# Patient Record
Sex: Female | Born: 1985 | Race: Black or African American | Hispanic: No | Marital: Single | Smoking: Current every day smoker
Health system: Southern US, Community
[De-identification: ages and names within clinical notes are randomized; demographics above are authoritative.]

## PROBLEM LIST (undated history)

## (undated) ENCOUNTER — Inpatient Hospital Stay (HOSPITAL_COMMUNITY): Payer: Self-pay

## (undated) DIAGNOSIS — F17200 Nicotine dependence, unspecified, uncomplicated: Secondary | ICD-10-CM

## (undated) DIAGNOSIS — R51 Headache: Secondary | ICD-10-CM

## (undated) HISTORY — PX: NO PAST SURGERIES: SHX2092

---
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
## 2007-03-07 ENCOUNTER — Emergency Department (HOSPITAL_COMMUNITY): Admission: EM | Admit: 2007-03-07 | Discharge: 2007-03-07 | Payer: Self-pay | Admitting: Family Medicine

## 2007-03-19 ENCOUNTER — Inpatient Hospital Stay (HOSPITAL_COMMUNITY): Admission: RE | Admit: 2007-03-19 | Discharge: 2007-03-19 | Payer: Self-pay | Admitting: Obstetrics & Gynecology

## 2007-03-20 ENCOUNTER — Inpatient Hospital Stay (HOSPITAL_COMMUNITY): Admission: AD | Admit: 2007-03-20 | Discharge: 2007-03-20 | Payer: Self-pay | Admitting: Obstetrics & Gynecology

## 2007-03-20 ENCOUNTER — Encounter: Payer: Self-pay | Admitting: Obstetrics & Gynecology

## 2007-05-03 ENCOUNTER — Emergency Department (HOSPITAL_COMMUNITY): Admission: EM | Admit: 2007-05-03 | Discharge: 2007-05-03 | Payer: Self-pay | Admitting: Family Medicine

## 2008-04-21 ENCOUNTER — Emergency Department (HOSPITAL_COMMUNITY): Admission: EM | Admit: 2008-04-21 | Discharge: 2008-04-21 | Payer: Self-pay | Admitting: Emergency Medicine

## 2008-08-26 ENCOUNTER — Emergency Department (HOSPITAL_COMMUNITY): Admission: EM | Admit: 2008-08-26 | Discharge: 2008-08-26 | Payer: Self-pay | Admitting: Emergency Medicine

## 2009-02-06 ENCOUNTER — Emergency Department (HOSPITAL_COMMUNITY): Admission: EM | Admit: 2009-02-06 | Discharge: 2009-02-06 | Payer: Self-pay | Admitting: Family Medicine

## 2009-06-15 ENCOUNTER — Inpatient Hospital Stay (HOSPITAL_COMMUNITY): Admission: AD | Admit: 2009-06-15 | Discharge: 2009-06-15 | Payer: Self-pay | Admitting: Obstetrics & Gynecology

## 2009-08-17 ENCOUNTER — Ambulatory Visit: Payer: Self-pay | Admitting: Family Medicine

## 2009-08-17 ENCOUNTER — Inpatient Hospital Stay (HOSPITAL_COMMUNITY): Admission: AD | Admit: 2009-08-17 | Discharge: 2009-08-19 | Payer: Self-pay | Admitting: Obstetrics and Gynecology

## 2009-09-23 ENCOUNTER — Emergency Department (HOSPITAL_COMMUNITY): Admission: EM | Admit: 2009-09-23 | Discharge: 2009-09-23 | Payer: Self-pay | Admitting: Emergency Medicine

## 2009-11-24 ENCOUNTER — Emergency Department (HOSPITAL_COMMUNITY): Admission: EM | Admit: 2009-11-24 | Discharge: 2009-11-24 | Payer: Self-pay | Admitting: Emergency Medicine

## 2010-01-12 ENCOUNTER — Emergency Department (HOSPITAL_COMMUNITY): Admission: EM | Admit: 2010-01-12 | Discharge: 2010-01-12 | Payer: Self-pay | Admitting: Family Medicine

## 2010-06-21 ENCOUNTER — Inpatient Hospital Stay (HOSPITAL_COMMUNITY)
Admission: AD | Admit: 2010-06-21 | Discharge: 2010-06-21 | Payer: Self-pay | Source: Home / Self Care | Admitting: Obstetrics & Gynecology

## 2010-06-21 ENCOUNTER — Ambulatory Visit: Payer: Self-pay | Admitting: Family Medicine

## 2010-08-30 ENCOUNTER — Inpatient Hospital Stay (HOSPITAL_COMMUNITY): Admission: AD | Admit: 2010-08-30 | Discharge: 2010-08-30 | Payer: Self-pay | Admitting: Obstetrics & Gynecology

## 2010-09-14 ENCOUNTER — Inpatient Hospital Stay (HOSPITAL_COMMUNITY)
Admission: AD | Admit: 2010-09-14 | Discharge: 2010-09-14 | Payer: Self-pay | Source: Home / Self Care | Admitting: Family Medicine

## 2010-09-19 ENCOUNTER — Inpatient Hospital Stay (HOSPITAL_COMMUNITY)
Admission: AD | Admit: 2010-09-19 | Discharge: 2010-09-19 | Payer: Self-pay | Source: Home / Self Care | Admitting: Obstetrics and Gynecology

## 2010-09-20 ENCOUNTER — Inpatient Hospital Stay (HOSPITAL_COMMUNITY)
Admission: AD | Admit: 2010-09-20 | Discharge: 2010-09-22 | Payer: Self-pay | Source: Home / Self Care | Attending: Obstetrics and Gynecology | Admitting: Obstetrics and Gynecology

## 2010-09-20 ENCOUNTER — Inpatient Hospital Stay (HOSPITAL_COMMUNITY)
Admission: AD | Admit: 2010-09-20 | Discharge: 2010-09-20 | Payer: Self-pay | Source: Home / Self Care | Admitting: Family Medicine

## 2010-11-02 ENCOUNTER — Ambulatory Visit
Admission: RE | Admit: 2010-11-02 | Discharge: 2010-11-02 | Payer: Self-pay | Source: Home / Self Care | Attending: Obstetrics & Gynecology | Admitting: Obstetrics & Gynecology

## 2010-11-03 ENCOUNTER — Ambulatory Visit: Admit: 2010-11-03 | Payer: Self-pay | Admitting: Obstetrics and Gynecology

## 2010-12-26 LAB — CBC
HCT: 36.5 % (ref 36.0–46.0)
Hemoglobin: 12 g/dL (ref 12.0–15.0)
Hemoglobin: 13.2 g/dL (ref 12.0–15.0)
MCH: 28 pg (ref 26.0–34.0)
Platelets: 184 10*3/uL (ref 150–400)
Platelets: 220 10*3/uL (ref 150–400)
RBC: 4.23 MIL/uL (ref 3.87–5.11)
RBC: 4.7 MIL/uL (ref 3.87–5.11)
RDW: 15.1 % (ref 11.5–15.5)
WBC: 9.6 10*3/uL (ref 4.0–10.5)

## 2010-12-26 LAB — DIFFERENTIAL
Eosinophils Absolute: 0.1 10*3/uL (ref 0.0–0.7)
Lymphs Abs: 1.9 10*3/uL (ref 0.7–4.0)
Monocytes Relative: 6 % (ref 3–12)
Neutrophils Relative %: 61 % (ref 43–77)

## 2010-12-26 LAB — STREP B DNA PROBE: Strep Group B Ag: POSITIVE

## 2010-12-26 LAB — TYPE AND SCREEN: ABO/RH(D): O POS

## 2010-12-26 LAB — HEPATITIS B SURFACE ANTIGEN: Hepatitis B Surface Ag: NEGATIVE

## 2010-12-27 LAB — STREP B DNA PROBE

## 2010-12-27 LAB — TYPE AND SCREEN
ABO/RH(D): O POS
Antibody Screen: NEGATIVE

## 2010-12-27 LAB — URINALYSIS, ROUTINE W REFLEX MICROSCOPIC
Bilirubin Urine: NEGATIVE
Hgb urine dipstick: NEGATIVE
Nitrite: NEGATIVE
Protein, ur: NEGATIVE mg/dL
Specific Gravity, Urine: 1.01 (ref 1.005–1.030)
Urobilinogen, UA: 0.2 mg/dL (ref 0.0–1.0)

## 2010-12-27 LAB — COMPREHENSIVE METABOLIC PANEL
AST: 21 U/L (ref 0–37)
Albumin: 3.2 g/dL — ABNORMAL LOW (ref 3.5–5.2)
BUN: 4 mg/dL — ABNORMAL LOW (ref 6–23)
Calcium: 9.3 mg/dL (ref 8.4–10.5)
Chloride: 104 mEq/L (ref 96–112)
Creatinine, Ser: 0.51 mg/dL (ref 0.4–1.2)
GFR calc Af Amer: >60 mL/min (ref >60)
Total Bilirubin: 0.1 mg/dL — ABNORMAL LOW (ref 0.3–1.2)
Total Protein: 6.8 g/dL (ref 6.0–8.3)

## 2010-12-27 LAB — RPR: RPR Ser Ql: NONREACTIVE

## 2010-12-27 LAB — CBC
MCH: 28.4 pg (ref 26.0–34.0)
MCV: 85.8 fL (ref 78.0–100.0)
Platelets: 208 10*3/uL (ref 150–400)
RBC: 4.52 MIL/uL (ref 3.87–5.11)
RDW: 14.9 % (ref 11.5–15.5)

## 2010-12-27 LAB — WET PREP, GENITAL: Trich, Wet Prep: NONE SEEN

## 2010-12-27 LAB — RUBELLA SCREEN: Rubella: 44.3 IU/mL — ABNORMAL HIGH

## 2010-12-27 LAB — HIV ANTIBODY (ROUTINE TESTING W REFLEX): HIV: NONREACTIVE

## 2010-12-27 LAB — GC/CHLAMYDIA PROBE AMP, GENITAL
Chlamydia, DNA Probe: NEGATIVE
GC Probe Amp, Genital: NEGATIVE

## 2010-12-27 LAB — SICKLE CELL SCREEN: Sickle Cell Screen: NEGATIVE

## 2010-12-29 LAB — URINALYSIS, ROUTINE W REFLEX MICROSCOPIC
Bilirubin Urine: NEGATIVE
Glucose, UA: NEGATIVE mg/dL
Hgb urine dipstick: NEGATIVE
Ketones, ur: NEGATIVE mg/dL
Protein, ur: NEGATIVE mg/dL
Urobilinogen, UA: 0.2 mg/dL (ref 0.0–1.0)

## 2010-12-29 LAB — WET PREP, GENITAL
Clue Cells Wet Prep HPF POC: NONE SEEN
Yeast Wet Prep HPF POC: NONE SEEN

## 2010-12-29 LAB — GC/CHLAMYDIA PROBE AMP, GENITAL
Chlamydia, DNA Probe: NEGATIVE
GC Probe Amp, Genital: NEGATIVE

## 2010-12-29 LAB — RAPID URINE DRUG SCREEN, HOSP PERFORMED
Amphetamines: NOT DETECTED
Barbiturates: NOT DETECTED
Benzodiazepines: NOT DETECTED
Opiates: NOT DETECTED

## 2010-12-29 LAB — URINE MICROSCOPIC-ADD ON

## 2010-12-29 LAB — STREP B DNA PROBE: Strep Group B Ag: POSITIVE

## 2011-01-03 ENCOUNTER — Inpatient Hospital Stay (INDEPENDENT_AMBULATORY_CARE_PROVIDER_SITE_OTHER)
Admission: RE | Admit: 2011-01-03 | Discharge: 2011-01-03 | Disposition: A | Discharge status: Home / Self Care | Payer: Self-pay | Source: Ambulatory Visit | Attending: Family Medicine | Admitting: Family Medicine

## 2011-01-08 LAB — POCT URINALYSIS DIP (DEVICE)
Bilirubin Urine: NEGATIVE
Glucose, UA: NEGATIVE mg/dL
Hgb urine dipstick: NEGATIVE
Specific Gravity, Urine: 1.02 (ref 1.005–1.030)
pH: 7 (ref 5.0–8.0)

## 2011-01-08 LAB — URINE CULTURE

## 2011-01-17 LAB — POCT URINALYSIS DIP (DEVICE)
Bilirubin Urine: NEGATIVE
Ketones, ur: NEGATIVE mg/dL
Specific Gravity, Urine: 1.02 (ref 1.005–1.030)
pH: 5.5 (ref 5.0–8.0)

## 2011-01-17 LAB — URINE CULTURE: Colony Count: >=100000

## 2011-01-18 LAB — CBC
MCHC: 33.3 g/dL (ref 30.0–36.0)
Platelets: 208 10*3/uL (ref 150–400)
RDW: 13.4 % (ref 11.5–15.5)

## 2011-01-18 LAB — RAPID URINE DRUG SCREEN, HOSP PERFORMED
Amphetamines: NOT DETECTED
Barbiturates: NOT DETECTED
Benzodiazepines: NOT DETECTED
Opiates: NOT DETECTED
Tetrahydrocannabinol: NOT DETECTED

## 2011-01-18 LAB — RPR: RPR Ser Ql: NONREACTIVE

## 2011-01-21 LAB — CBC
Platelets: 171 10*3/uL (ref 150–400)
RBC: 4.01 MIL/uL (ref 3.87–5.11)
WBC: 7.2 10*3/uL (ref 4.0–10.5)

## 2011-01-21 LAB — RAPID URINE DRUG SCREEN, HOSP PERFORMED
Amphetamines: NOT DETECTED
Barbiturates: NOT DETECTED
Cocaine: NOT DETECTED
Opiates: NOT DETECTED

## 2011-01-21 LAB — DIFFERENTIAL
Eosinophils Absolute: 0.2 10*3/uL (ref 0.0–0.7)
Lymphocytes Relative: 22 % (ref 12–46)
Lymphs Abs: 1.6 10*3/uL (ref 0.7–4.0)
Monocytes Relative: 7 % (ref 3–12)
Neutro Abs: 4.9 10*3/uL (ref 1.7–7.7)
Neutrophils Relative %: 68 % (ref 43–77)

## 2011-01-21 LAB — RPR: RPR Ser Ql: NONREACTIVE

## 2011-01-21 LAB — URINALYSIS, ROUTINE W REFLEX MICROSCOPIC
Hgb urine dipstick: NEGATIVE
Nitrite: NEGATIVE
Specific Gravity, Urine: <1.005 — ABNORMAL LOW (ref 1.005–1.030)
Urobilinogen, UA: 0.2 mg/dL (ref 0.0–1.0)
pH: 7 (ref 5.0–8.0)

## 2011-01-21 LAB — HIV ANTIBODY (ROUTINE TESTING W REFLEX): HIV: NONREACTIVE

## 2011-01-21 LAB — SICKLE CELL SCREEN: Sickle Cell Screen: NEGATIVE

## 2011-01-21 LAB — HEPATITIS B SURFACE ANTIGEN: Hepatitis B Surface Ag: NEGATIVE

## 2011-01-21 LAB — RUBELLA SCREEN: Rubella: 47 IU/mL — ABNORMAL HIGH

## 2011-01-21 LAB — WET PREP, GENITAL: Clue Cells Wet Prep HPF POC: NONE SEEN

## 2011-03-03 NOTE — Op Note (Signed)
NAMESHANIGUA, GIBB NO.:  [id number]   MEDICAL RECORD NO.:  [id number]          PATIENT TYPE:  AMB   LOCATION:  [facility]                          FACILITY:  [facility]   PHYSICIAN:  Malva Limes, M.D.    DATE OF BIRTH:  06/08/1986   DATE OF PROCEDURE:  07/09/2006  DATE OF DISCHARGE:  07/09/2006                                 OPERATIVE REPORT   PREOPERATIVE DIAGNOSIS:  Missed abortion.   POSTOPERATIVE DIAGNOSIS:  Missed abortion.   PROCEDURE:  Dilation and evacuation.   SURGEON:  Malva Limes, M.D.   ANESTHESIA:  MAC.   SPECIMENS:  Products of conception sent to pathology.   COMPLICATIONS:  None.   DRAINS:  None.   ESTIMATED BLOOD LOSS:  50 mL   PROCEDURE:  The patient was taken to the operating room where she was placed  in the dorsal lithotomy position.  The patient was prepped with Betadine and  draped in the usual fashion for this procedure.  On placing a speculum in  the vagina, the patient was noted to pass the products of conception in its  entirety.  The single-tooth tenaculum was applied to the anterior cervical  lip.  The 7-mm suction curet was placed into the uterine cavity and  remaining products of conception were withdrawn.  Sharp curettage was then  performed followed by repeat suction.  The patient tolerated the procedure  well.  She was taken to the recovery room in stable condition.  Instrument  and lap counts correct x1.  The patient will be discharged to home.  She  will be sent home with Darvocet to take p.r.n. She will follow up in the  office in four weeks.  She will be given RhoGAM if Rh negative.           ______________________________  Malva Limes, M.D.     MA/MEDQ  D:  07/09/2006  T:  07/11/2006  Job:  [id number]

## 2011-07-05 ENCOUNTER — Emergency Department (HOSPITAL_COMMUNITY)
Admission: EM | Admit: 2011-07-05 | Discharge: 2011-07-05 | Disposition: A | Discharge status: Home / Self Care | Payer: Self-pay | Attending: Emergency Medicine | Admitting: Emergency Medicine

## 2011-07-05 DIAGNOSIS — R51 Headache: Secondary | ICD-10-CM | POA: Insufficient documentation

## 2011-07-05 DIAGNOSIS — F1210 Cannabis abuse, uncomplicated: Secondary | ICD-10-CM | POA: Insufficient documentation

## 2011-07-05 DIAGNOSIS — M542 Cervicalgia: Secondary | ICD-10-CM | POA: Insufficient documentation

## 2011-07-05 DIAGNOSIS — Z87891 Personal history of nicotine dependence: Secondary | ICD-10-CM | POA: Insufficient documentation

## 2011-07-13 LAB — WET PREP, GENITAL
Trich, Wet Prep: NONE SEEN
Yeast Wet Prep HPF POC: NONE SEEN

## 2011-07-13 LAB — POCT PREGNANCY, URINE: Operator id: 235561

## 2011-07-13 LAB — POCT URINALYSIS DIP (DEVICE)
Bilirubin Urine: NEGATIVE
Ketones, ur: NEGATIVE
Operator id: 235561

## 2011-07-13 LAB — GC/CHLAMYDIA PROBE AMP, GENITAL: GC Probe Amp, Genital: NEGATIVE

## 2011-07-31 LAB — POCT URINALYSIS DIP (DEVICE)
Glucose, UA: NEGATIVE
Nitrite: NEGATIVE
Operator id: 247071
Protein, ur: 30 — AB
Specific Gravity, Urine: 1.025
Urobilinogen, UA: 2 — ABNORMAL HIGH
pH: 6.5

## 2011-08-03 LAB — CBC
HCT: 41.6
Hemoglobin: 13.6
MCHC: 32.8
MCHC: 34.2
MCV: 83.2
Platelets: 270
RBC: 4.67
RDW: 13.4

## 2011-10-17 ENCOUNTER — Emergency Department (HOSPITAL_COMMUNITY)
Admission: EM | Admit: 2011-10-17 | Discharge: 2011-10-17 | Disposition: A | Discharge status: Home / Self Care | Payer: Self-pay | Attending: Emergency Medicine | Admitting: Emergency Medicine

## 2011-10-17 ENCOUNTER — Encounter: Payer: Self-pay | Admitting: *Deleted

## 2011-10-17 DIAGNOSIS — S1093XA Contusion of unspecified part of neck, initial encounter: Secondary | ICD-10-CM | POA: Insufficient documentation

## 2011-10-17 DIAGNOSIS — S40029A Contusion of unspecified upper arm, initial encounter: Secondary | ICD-10-CM | POA: Insufficient documentation

## 2011-10-17 DIAGNOSIS — S5000XA Contusion of unspecified elbow, initial encounter: Secondary | ICD-10-CM | POA: Insufficient documentation

## 2011-10-17 DIAGNOSIS — F17200 Nicotine dependence, unspecified, uncomplicated: Secondary | ICD-10-CM | POA: Insufficient documentation

## 2011-10-17 DIAGNOSIS — S5010XA Contusion of unspecified forearm, initial encounter: Secondary | ICD-10-CM | POA: Insufficient documentation

## 2011-10-17 DIAGNOSIS — R609 Edema, unspecified: Secondary | ICD-10-CM | POA: Insufficient documentation

## 2011-10-17 DIAGNOSIS — S41009A Unspecified open wound of unspecified shoulder, initial encounter: Secondary | ICD-10-CM | POA: Insufficient documentation

## 2011-10-17 DIAGNOSIS — R220 Localized swelling, mass and lump, head: Secondary | ICD-10-CM | POA: Insufficient documentation

## 2011-10-17 DIAGNOSIS — S0003XA Contusion of scalp, initial encounter: Secondary | ICD-10-CM | POA: Insufficient documentation

## 2011-10-17 DIAGNOSIS — IMO0001 Reserved for inherently not codable concepts without codable children: Secondary | ICD-10-CM | POA: Insufficient documentation

## 2011-10-17 MED ORDER — AMOXICILLIN-POT CLAVULANATE 875-125 MG PO TABS: 1.0000 | ORAL_TABLET | Freq: Two times a day (BID) | ORAL | Status: AC

## 2011-10-17 MED ORDER — TETANUS-DIPHTH-ACELL PERTUSSIS 5-2.5-18.5 LF-MCG/0.5 IM SUSP
0.5000 mL | Freq: Once | INTRAMUSCULAR | Status: DC
  Filled 2011-10-17 (×2): qty 0.5
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
## 2011-10-17 NOTE — L&D Delivery Note (Signed)
Delivery Note At 8:29 AM a viable female was delivered by RN immediately prior to arrival by midwife and resident.  Providers present for delivery of placenta. APGAR: 2, (5 and 10 minute Apgars pending NICU assessment); weight 6 lb 12.1 oz (3064 g).   Placenta status:intact ,spontaneous.  Cord:  3-vessel with the following complications: None .  Cord pH: 7.21  Anesthesia:  epidural Episiotomy: None Lacerations: 1st degree right labial, hemostatic, not repaired Suture Repair: N/A Est. Blood Loss (mL): 250   Mom to postpartum.  Baby to NICU team arrived prior to providers because were present on L&D floor at time of delivery.  Dunn, Patricia Clingan 07/08/2012, 9:02 AM

## 2012-03-20 ENCOUNTER — Encounter (HOSPITAL_COMMUNITY): Payer: Self-pay | Admitting: *Deleted

## 2012-03-20 ENCOUNTER — Inpatient Hospital Stay (HOSPITAL_COMMUNITY)
Admission: AD | Admit: 2012-03-20 | Discharge: 2012-03-20 | Disposition: A | Discharge status: Home / Self Care | Payer: Self-pay | Source: Ambulatory Visit | Attending: Obstetrics and Gynecology | Admitting: Obstetrics and Gynecology

## 2012-03-20 DIAGNOSIS — O0932 Supervision of pregnancy with insufficient antenatal care, second trimester: Secondary | ICD-10-CM

## 2012-03-20 DIAGNOSIS — O0930 Supervision of pregnancy with insufficient antenatal care, unspecified trimester: Secondary | ICD-10-CM | POA: Insufficient documentation

## 2012-03-20 DIAGNOSIS — O99891 Other specified diseases and conditions complicating pregnancy: Secondary | ICD-10-CM | POA: Insufficient documentation

## 2012-03-20 DIAGNOSIS — R109 Unspecified abdominal pain: Secondary | ICD-10-CM | POA: Insufficient documentation

## 2012-03-20 HISTORY — DX: Headache: R51

## 2012-03-20 LAB — CBC
Hemoglobin: 11.5 g/dL — ABNORMAL LOW (ref 12.0–15.0)
MCH: 27.7 pg (ref 26.0–34.0)
Platelets: 253 10*3/uL (ref 150–400)
RBC: 4.15 MIL/uL (ref 3.87–5.11)
WBC: 11.7 10*3/uL — ABNORMAL HIGH (ref 4.0–10.5)

## 2012-03-20 LAB — URINALYSIS, ROUTINE W REFLEX MICROSCOPIC
Bilirubin Urine: NEGATIVE
Leukocytes, UA: NEGATIVE
Nitrite: NEGATIVE
Specific Gravity, Urine: 1.01 (ref 1.005–1.030)
Urobilinogen, UA: 0.2 mg/dL (ref 0.0–1.0)
pH: 7.5 (ref 5.0–8.0)

## 2012-03-20 LAB — TYPE AND SCREEN
ABO/RH(D): O POS
Antibody Screen: NEGATIVE

## 2012-03-20 LAB — WET PREP, GENITAL: Clue Cells Wet Prep HPF POC: NONE SEEN

## 2012-03-20 LAB — RPR: RPR Ser Ql: NONREACTIVE

## 2012-03-20 MED ORDER — CONCEPT OB 130-92.4-1 MG PO CAPS: 1.0000 | ORAL_CAPSULE | ORAL | Status: DC

## 2012-03-20 NOTE — MAU Provider Note (Signed)
History     CSN: [id number]  Arrival date and time: 03/20/12 1458   First Provider Initiated Contact with Patient 03/20/12 1554      Chief Complaint  Patient presents with  . Abdominal Pain   HPI  Patient has known since November or December that she was pregnant.  She took her first pregnancy test in January and it was positive.  She has not yet accessed prenatal care because "I just don't do the prenatal care".  She does not plan on pursuing any further prenatal care after this visit.  She has "been praying over her baby".  She would like bilateral tubal ligation after this baby, and understands that she needs to sign consent well in advance.  She is willing to pursue prenatal care in order to sign papers.  Comes to the MAU for abdominal pain over entire abdomen starting yesterday which feel like contractions or cramping, goes away in between episodes, then comes back.  She comes in because she is worried about the baby.  She feels stomach tension when she has the pains.  Doesn't have any pain now.  Had vaginal bleeding one month ago which stopped on it's own.  No loss of fluid.  Nausea and vomiting every day.  Has heart burn.  Denies chest pain and dyspnea.  Feet swelling but no face swelling.  No sensory or motor changes No headache, blurry/double vision.  Other pertinent ROS negative.  OB History    Grav Para Term Preterm Abortions TAB SAB Ect Mult Living   7 4 4  0 2 0 2 0 0 4      Past Medical History  Diagnosis Date  . Asthma   . Headache     migraines  Asthma: diagnosed in childhood, has albuterol inhaler and uses it once per year, when she is sick.  Has never needed steroids for her asthma.  Has never been admitted for asthma.  Past Surgical History  Procedure Date  . Vaginal delivery     X 4  Denies any problems with prior deliveries.  History reviewed. No pertinent family history.  History  Substance Use Topics  . Smoking status: Current Everyday  Smoker -- 1.0 packs/day    Types: Cigarettes  . Smokeless tobacco: Never Used  . Alcohol Use: Yes     Beginning of pregnancy    Allergies: No Known Allergies  Prescriptions prior to admission  Medication Sig Dispense Refill  . acetaminophen (TYLENOL) 500 MG tablet Take 500 mg by mouth every 6 (six) hours as needed. Tooth ache      . naproxen sodium (ANAPROX) 220 MG tablet Take 220 mg by mouth 2 (two) times daily with a meal. Tooth ache      Denies naproxen, med list updated.  ROS Physical Exam   Blood pressure 136/57, pulse 80, temperature 98 F (36.7 C), temperature source Oral, resp. rate 18, height 5\' 3"  (1.6 m), weight 54.432 kg (120 lb), last menstrual period 09/15/2011.  Physical Exam  Constitutional: She is oriented to person, place, and time. She appears well-developed and well-nourished.  HENT:  Head: Normocephalic and atraumatic.  Eyes: Conjunctivae and EOM are normal. Right eye exhibits no discharge. Left eye exhibits no discharge. No scleral icterus.  Neck: No tracheal deviation present.  Cardiovascular: Normal rate and regular rhythm.   Respiratory: Effort normal and breath sounds normal. No stridor.  GI: Soft. She exhibits no distension. There is no tenderness. There is no rebound and no guarding.  Uterus gravid, nontender.  Fundus measures 25 cm above pubic symphysis.  Musculoskeletal: She exhibits no edema.  Neurological: She is alert and oriented to person, place, and time.  Skin: Skin is warm and dry.  Psychiatric: She has a normal mood and affect. Her behavior is normal. Judgment and thought content normal.   Speculum exam unremarkable except for profuse white discharge without clumping.  Wet prep and GC/Chlam sent.  Cervix closed, long, soft, posterior.  MAU Course  Procedures   Assessment and Plan  I counseled this patient to pursue prenatal care and quit smoking.   We were going to begin initial prenatal evaluation and obtain an ultrasound, but  she left prior to completion of evaluation.  Clancy Gourd 03/20/2012, 3:57 PM   Pt left before I saw her, but I agree with above note. I scheduled outpt [place] and F/U note sent to [facility] re initiating [facility] with [place] at [facility].

## 2012-03-20 NOTE — Discharge Instructions (Signed)
Abdominal Pain During Pregnancy °Belly (abdominal) pain is common during pregnancy. Most of the time, it is not a serious problem. Other times, it can be a sign that something is wrong with the pregnancy. Always tell your doctor if you have belly pain. °HOME CARE °For mild pain: °· Do not have sex (intercourse) or put anything in your vagina until you feel better.  °· Rest until your pain stops. If your pain lasts longer than 1 hour, call your doctor.  °· Drink clear fluids if you feel sick to your stomach (nauseous).  °· Do not eat solid food until you feel better.  °· Only take medicine as told by your doctor.  °· Keep all doctor visits as told.  °GET HELP RIGHT AWAY IF:  °· You are bleeding, leaking fluid, or pieces of tissue come out of your vagina.  °· You have more pain or cramping.  °· You keep throwing up (vomiting).  °· You have pain when you pee (urinate) or have blood in your pee.  °· You have a fever.  °· You do not feel your baby moving as much.  °· You feel very weak or feel like passing out.  °· You have trouble breathing, with or without belly pain.  °· You have a very bad headache and belly pain.  °· You have fluid leaking from your vagina and belly pain.  °· You keep having watery poop (diarrhea).  °· Your belly pain does not go away after resting, or the pain gets worse.  °MAKE SURE YOU:  °· Understand these instructions.  °· Will watch your condition.  °· Will get help right away if you are not doing well or get worse.  °Document Released: 09/20/2009 Document Revised: 09/21/2011 Document Reviewed: 04/28/2011 °[employer]® Patient Information ©2012 [employer]. °

## 2012-03-20 NOTE — MAU Note (Signed)
Abdominal pain since last night. No bleeding or discharge. No prenatal care. Wants U/S to see what she is having. States she has no plan for [facility]. Did not get [facility] with last baby.

## 2012-03-21 ENCOUNTER — Encounter: Payer: Self-pay | Admitting: Obstetrics & Gynecology

## 2012-03-21 LAB — GC/CHLAMYDIA PROBE AMP, GENITAL
Chlamydia, DNA Probe: NEGATIVE
GC Probe Amp, Genital: NEGATIVE

## 2012-03-21 LAB — HIV ANTIBODY (ROUTINE TESTING W REFLEX): HIV: NONREACTIVE

## 2012-03-21 NOTE — MAU Provider Note (Signed)
Agree with above note.  Patricia Dunn 03/21/2012 1:21 PM

## 2012-03-22 LAB — URINE CULTURE: Colony Count: 3000

## 2012-03-26 ENCOUNTER — Ambulatory Visit (HOSPITAL_COMMUNITY)
Admission: RE | Admit: 2012-03-26 | Discharge: 2012-03-26 | Disposition: A | Discharge status: Home / Self Care | Payer: Medicaid Other | Source: Ambulatory Visit | Attending: Obstetrics and Gynecology | Admitting: Obstetrics and Gynecology

## 2012-03-26 ENCOUNTER — Telehealth: Payer: Self-pay | Admitting: Family Medicine

## 2012-03-26 DIAGNOSIS — O99330 Smoking (tobacco) complicating pregnancy, unspecified trimester: Secondary | ICD-10-CM | POA: Insufficient documentation

## 2012-03-26 DIAGNOSIS — O0932 Supervision of pregnancy with insufficient antenatal care, second trimester: Secondary | ICD-10-CM

## 2012-03-26 DIAGNOSIS — Z3689 Encounter for other specified antenatal screening: Secondary | ICD-10-CM | POA: Insufficient documentation

## 2012-03-26 DIAGNOSIS — O0930 Supervision of pregnancy with insufficient antenatal care, unspecified trimester: Secondary | ICD-10-CM | POA: Insufficient documentation

## 2012-03-26 NOTE — Telephone Encounter (Signed)
Called patient mobile number, it's not on. Called home number and left message with a young man that was talking to her, and stated she would be here at 9:45.

## 2012-03-27 ENCOUNTER — Encounter: Payer: Medicaid Other | Admitting: Family Medicine

## 2012-04-17 ENCOUNTER — Emergency Department (HOSPITAL_COMMUNITY)
Admission: EM | Admit: 2012-04-17 | Discharge: 2012-04-17 | Disposition: A | Discharge status: Home / Self Care | Payer: Self-pay | Attending: Emergency Medicine | Admitting: Emergency Medicine

## 2012-04-17 ENCOUNTER — Encounter (HOSPITAL_COMMUNITY): Payer: Self-pay | Admitting: *Deleted

## 2012-04-17 DIAGNOSIS — K089 Disorder of teeth and supporting structures, unspecified: Secondary | ICD-10-CM | POA: Insufficient documentation

## 2012-04-17 DIAGNOSIS — K0889 Other specified disorders of teeth and supporting structures: Secondary | ICD-10-CM

## 2012-04-17 DIAGNOSIS — F17200 Nicotine dependence, unspecified, uncomplicated: Secondary | ICD-10-CM | POA: Insufficient documentation

## 2012-04-17 MED ORDER — PENICILLIN V POTASSIUM 500 MG PO TABS: 500.0000 mg | ORAL_TABLET | Freq: Four times a day (QID) | ORAL | Status: AC

## 2012-04-17 MED ORDER — OXYCODONE-ACETAMINOPHEN 5-325 MG PO TABS: 2.0000 | ORAL_TABLET | ORAL | Status: AC | PRN

## 2012-04-17 MED ORDER — MORPHINE SULFATE 4 MG/ML IJ SOLN
4.0000 mg | Freq: Once | INTRAMUSCULAR | Status: AC
  Administered 2012-04-17: 4 mg via INTRAMUSCULAR
  Filled 2012-04-17: qty 1

## 2012-04-17 NOTE — ED Notes (Signed)
Pt is here for dental pain.  Pt has pain in right lower molar.  This tooth has been hurting her for some time but pain became more severe today with radiation to right ear.  Pt is in obvious discomfort

## 2012-04-17 NOTE — ED Provider Notes (Signed)
History   This chart was scribed for Forbes Cellar, MD by Charolett Bumpers . The patient was seen in room TR02C/TR02C.    CSN: [id number]  Arrival date & time 04/17/12  1704   First MD Initiated Contact with Patient 04/17/12 1743      Chief Complaint  Patient presents with  . Dental Pain    (Consider location/radiation/quality/duration/timing/severity/associated sxs/prior treatment) HPI Patricia Dunn is a 26 y.o. female "30 something weeks" pregnancy who presents to the Emergency Department complaining of intermittent, moderate right lower dental pain for the past several weeks, with her dental pain worsening today. Patient reports that her dental pain radiates to her right ear. Patient states that she has taken Advil and ibuprofen with no relief. Patient rates her dental pain a 10/10. Patient denies any abdominal pain, n/v/d, fevers or chills. Patient states that she is pregnant, with her due date on Sept 20th. No prior medical hx reported. Denise abd pain/vag bleeding/vag discharge.   Past Medical History  Diagnosis Date  . Asthma   . Headache     migraines    Past Surgical History  Procedure Date  . Vaginal delivery     X 4    No family history on file.  History  Substance Use Topics  . Smoking status: Current Everyday Smoker -- 1.0 packs/day    Types: Cigarettes  . Smokeless tobacco: Never Used  . Alcohol Use: Yes     Beginning of pregnancy    OB History    Grav Para Term Preterm Abortions TAB SAB Ect Mult Living   7 4 4  0 2 0 2 0 0 4      Review of Systems A complete 10 system review of systems was obtained and all systems are negative except as noted in the HPI and PMH.   Allergies  Review of patient's allergies indicates no known allergies.  Home Medications   Current Outpatient Rx  Name Route Sig Dispense Refill  . ACETAMINOPHEN 500 MG PO TABS Oral Take 1,000 mg by mouth every 6 (six) hours as needed. Tooth ache    . NAPROXEN SODIUM 220  MG PO TABS Oral Take 440 mg by mouth 2 (two) times daily as needed. For pain    . OXYCODONE-ACETAMINOPHEN 5-325 MG PO TABS Oral Take 2 tablets by mouth every 4 (four) hours as needed for pain. 10 tablet 0  . PENICILLIN V POTASSIUM 500 MG PO TABS Oral Take 1 tablet (500 mg total) by mouth 4 (four) times daily. 40 tablet 0    BP 102/83  Pulse 115  Temp 98.8 F (37.1 C) (Oral)  Resp 22  SpO2 98%  LMP 09/15/2011  Physical Exam  Nursing note and vitals reviewed. Constitutional: She is oriented to person, place, and time. She appears well-developed and well-nourished. No distress.  HENT:  Head: Normocephalic and atraumatic. No trismus in the jaw.  Right Ear: Tympanic membrane is not erythematous.  Mouth/Throat: No oropharyngeal exudate, posterior oropharyngeal edema or posterior oropharyngeal erythema.         Poor dentition to right lower molars. Tenderness to percussion to all of right molars on lower side. No erythema noted. No visible abscess. Small effusion behind right TM. No erythema of right TM.   Eyes: EOM are normal. Pupils are equal, round, and reactive to light.  Neck: Neck supple. No tracheal deviation present.  Cardiovascular: Normal rate, regular rhythm and normal heart sounds.   Pulmonary/Chest: Effort normal and breath sounds normal.  No respiratory distress.  Abdominal: Soft. Bowel sounds are normal. She exhibits no distension. There is no tenderness.       Gravid uterus.   Musculoskeletal: Normal range of motion. She exhibits no edema.  Neurological: She is alert and oriented to person, place, and time. No sensory deficit.  Skin: Skin is warm and dry.  Psychiatric: She has a normal mood and affect. Her behavior is normal.    ED Course  Procedures (including critical care time)  DIAGNOSTIC STUDIES: Oxygen Saturation is 98% on room air, normal by my interpretation.    COORDINATION OF CARE:  5:47pm-Discussed planned course of treatment with the patient, who is  agreeable at this time. Advised the patient to stop taking ibuprofen while pregnant. Also discussed the risks from taking narcotics and decreased respirations of the baby if she was to go into labor, patient understood.  6:00pm-Medication Orders: Morphine 4 mg/mL injection 4 mg-once.    Labs Reviewed - No data to display No results found.   1. Pain, dental     MDM  Dental pain, possible apical abscess. Nothing to drain. Pt moaning and crying in severe pain. Discussed extensive risks of narcotic medications regarding harm to fetus and resp depression if early labor and delivery were to occur.  She states "I don't care, my mouth hurts!". Given IM morphine in the ED. 10 tablets of percocet given for home with PCN VK. She will need to f/u with dentist.   I personally performed the services described in this documentation, which was scribed in my presence. The recorded information has been reviewed and considered.        Forbes Cellar, MD 04/18/12 2257

## 2012-04-17 NOTE — ED Notes (Addendum)
Pt states she has been having tooth pain for about a month. Pt states the pain is radiating towards the her ear.  Pt has No abscess present, edema, no trauma to the teeth.Pt has visible cavities on right side of her mouth. Asked pt how many weeks is she. Pt stated "30 something. I'm due in September. PT states pain is at a 10.

## 2012-04-17 NOTE — Discharge Instructions (Signed)
Dental Pain A tooth ache may be caused by cavities (tooth decay). Cavities expose the nerve of the tooth to air and hot or cold temperatures. It may come from an infection or abscess (also called a boil or furuncle) around your tooth. It is also often caused by dental caries (tooth decay). This causes the pain you are having. DIAGNOSIS  Your caregiver can diagnose this problem by exam. TREATMENT   If caused by an infection, it may be treated with medications which kill germs (antibiotics) and pain medications as prescribed by your caregiver. Take medications as directed.   Only take over-the-counter or prescription medicines for pain, discomfort, or fever as directed by your caregiver.   Whether the tooth ache today is caused by infection or dental disease, you should see your dentist as soon as possible for further care.  SEEK MEDICAL CARE IF: The exam and treatment you received today has been provided on an emergency basis only. This is not a substitute for complete medical or dental care. If your problem worsens or new problems (symptoms) appear, and you are unable to meet with your dentist, call or return to this location. SEEK IMMEDIATE MEDICAL CARE IF:   You have a fever.   You develop redness and swelling of your face, jaw, or neck.   You are unable to open your mouth.   You have severe pain uncontrolled by pain medicine.  MAKE SURE YOU:   Understand these instructions.   Will watch your condition.   Will get help right away if you are not doing well or get worse.  Document Released: 10/02/2005 Document Revised: 09/21/2011 Document Reviewed: 05/20/2008 [facility] Patient Information 2012 [place], [place].  RESOURCE GUIDE  Dental Problems  Patients with Medicaid: [facility] [phone] [address]                                           [phone] [employer] [address] Phone:  [phone]                                                    Phone:  [phone]  If unable to pay or uninsured, contact:  [facility] or [facility]. to become qualified for the adult dental clinic.  Chronic Pain Problems Contact [facility]  [phone] Patients need to be referred by their primary care doctor.  Insufficient Money for Medicine Contact [employer] Way:  call "211" or [facility] [phone].  No Primary Care Doctor Call [facility]  [phone] Other agencies that provide inexpensive medical care    [facility]  [phone]    [facility]  [phone]    [facility]  [phone]    [facility]  [phone]    [facility]  [phone]    [facility]  [phone]  [facility]  [phone] [facility]  [phone] [facility]   [phone] (emergency services [phone])  Abuse/Neglect [facility] Child Abuse Hotline 904-671-3535 [facility] Child Abuse Hotline (504)768-2386 (After Hours)  Emergency  Shelter [employer] [phone]  [facility] [place] [facility] [place] [phone] [facility] [phone]  MRSA Hotline #:   (229) 201-1933    [facility] Resources  [facility]  [employer]                           [facility] Dept. [address] [place]                     [address]         [address]  Blondell Reveal Phone:  [phone]                                  Phone:  [phone]                   Phone:  [phone]  [facility] Phone:  [phone]  [facility] Child Abuse Hotline 848-325-1154 [phone] (After Hours)

## 2012-06-05 ENCOUNTER — Encounter (HOSPITAL_COMMUNITY): Payer: Self-pay

## 2012-06-05 ENCOUNTER — Inpatient Hospital Stay (HOSPITAL_COMMUNITY)
Admission: AD | Admit: 2012-06-05 | Discharge: 2012-06-05 | Disposition: A | Discharge status: Home / Self Care | Payer: Medicaid Other | Source: Ambulatory Visit | Attending: Obstetrics & Gynecology | Admitting: Obstetrics & Gynecology

## 2012-06-05 DIAGNOSIS — O0930 Supervision of pregnancy with insufficient antenatal care, unspecified trimester: Secondary | ICD-10-CM

## 2012-06-05 DIAGNOSIS — R51 Headache: Secondary | ICD-10-CM | POA: Insufficient documentation

## 2012-06-05 DIAGNOSIS — O99891 Other specified diseases and conditions complicating pregnancy: Secondary | ICD-10-CM | POA: Insufficient documentation

## 2012-06-05 DIAGNOSIS — M549 Dorsalgia, unspecified: Secondary | ICD-10-CM | POA: Insufficient documentation

## 2012-06-05 DIAGNOSIS — O26899 Other specified pregnancy related conditions, unspecified trimester: Secondary | ICD-10-CM

## 2012-06-05 DIAGNOSIS — R109 Unspecified abdominal pain: Secondary | ICD-10-CM | POA: Insufficient documentation

## 2012-06-05 LAB — URINALYSIS, ROUTINE W REFLEX MICROSCOPIC
Bilirubin Urine: NEGATIVE
Hgb urine dipstick: NEGATIVE
Nitrite: NEGATIVE
Specific Gravity, Urine: 1.01 (ref 1.005–1.030)
Urobilinogen, UA: 0.2 mg/dL (ref 0.0–1.0)
pH: 7.5 (ref 5.0–8.0)

## 2012-06-05 LAB — WET PREP, GENITAL
Clue Cells Wet Prep HPF POC: NONE SEEN
Trich, Wet Prep: NONE SEEN
Yeast Wet Prep HPF POC: NONE SEEN

## 2012-06-05 MED ORDER — ACETAMINOPHEN 500 MG PO TABS
1000.0000 mg | ORAL_TABLET | ORAL | Status: AC
  Administered 2012-06-05: 1000 mg via ORAL
  Filled 2012-06-05: qty 2

## 2012-06-05 NOTE — MAU Provider Note (Signed)
History     CSN: [id number]  Arrival date and time: 06/05/12 9562   First Provider Initiated Contact with Patient 06/05/12 1128      Chief Complaint  Patient presents with  . Headache  . Abdominal Pain   HPI  Pt G7 P4024 with no prenatal care presents today with lower abdominal pain, low back pain, and a headache. She states that the abd pain and back pain started a few weeks ago and has been getting worse. It is worse when she is standing and laying on her side. She states it feels sharp and tight. Her headache started on Saturday and is described as throbbing. She has not taken any medications for either problem. She also reports a thin, greenish mucus for the last two days in her underwear. She denies vision changes, nausea, vomiting, spotting, or vaginal gush of fluid.   Past Medical History  Diagnosis Date  . Asthma   . Headache     migraines    Past Surgical History  Procedure Date  . Vaginal delivery     X 4  . No past surgeries     Family History  Problem Relation Age of Onset  . Other Neg Hx     History  Substance Use Topics  . Smoking status: Current Everyday Smoker -- 1.0 packs/day    Types: Cigarettes  . Smokeless tobacco: Never Used  . Alcohol Use: Yes     Beginning of pregnancy    Allergies: No Known Allergies  Prescriptions prior to admission  Medication Sig Dispense Refill  . acetaminophen (TYLENOL) 500 MG tablet Take 1,000 mg by mouth every 6 (six) hours as needed. Tooth ache        Review of Systems  Constitutional: Negative for fever and chills.  HENT:       Throbbing frontal headache  Eyes: Negative for blurred vision, double vision and photophobia.  Gastrointestinal: Negative for nausea, vomiting, abdominal pain, diarrhea and constipation.  Genitourinary: Negative for dysuria, urgency, frequency and hematuria.  Neurological: Positive for headaches.   Physical Exam   Blood pressure 109/68, pulse 71, temperature 98.8 F (37.1 C),  temperature source Oral, resp. rate 16, height 5\' 3"  (1.6 m), weight 63.504 kg (140 lb), last menstrual period 09/15/2011, SpO2 100.00%.  Physical Exam  Constitutional: She is oriented to person, place, and time. She appears well-developed and well-nourished. No distress.  HENT:  Head: Normocephalic and atraumatic.  Eyes: EOM are normal.  Cardiovascular: Normal rate, regular rhythm and normal heart sounds.   Respiratory: Effort normal.  GI: Soft. There is no tenderness.  Genitourinary: Cervix exhibits no motion tenderness, no discharge and no friability. No erythema, tenderness or bleeding around the vagina. Vaginal discharge found.       Thin, white, mucous present in vaginal vault; fern - negative; cervix:  FT/long  Neurological: She is alert and oriented to person, place, and time.  Skin: Skin is warm and dry. No erythema.   No CVA tenderness  MAU Course  Procedures  Results for orders placed during the hospital encounter of 06/05/12 (from the past 24 hour(s))  URINALYSIS, ROUTINE W REFLEX MICROSCOPIC     Status: Normal   Collection Time   06/05/12 10:18 AM      Component Value Range   Color, Urine YELLOW  YELLOW   APPearance CLEAR  CLEAR   Specific Gravity, Urine 1.010  1.005 - 1.030   pH 7.5  5.0 - 8.0   Glucose, UA NEGATIVE  NEGATIVE mg/dL   Hgb urine dipstick NEGATIVE  NEGATIVE   Bilirubin Urine NEGATIVE  NEGATIVE   Ketones, ur NEGATIVE  NEGATIVE mg/dL   Protein, ur NEGATIVE  NEGATIVE mg/dL   Urobilinogen, UA 0.2  0.0 - 1.0 mg/dL   Nitrite NEGATIVE  NEGATIVE   Leukocytes, UA NEGATIVE  NEGATIVE   Wet prep - negative  Assessment and Plan  Round Ligament Pain Pregnancy - Headache  Plan: Begin prenatal care in low risk clinic - 06/12/12. GBS culture collected.  Aniceto Boss, MA, PA-S2  [facility], [place] 06/05/2012, 11:35 AM   I examined pt and agree with documentation above and PA-S plan of care. [facility]

## 2012-06-05 NOTE — MAU Note (Signed)
Patient states she has had no prenatal care except visits to MAU. Patient states she has been having headaches, abdominal and back pain for about one week. Denies any bleeding or leaking. Had a mucus discharge yesterday. Reports good fetal movement.

## 2012-06-08 LAB — CULTURE, BETA STREP (GROUP B ONLY)

## 2012-06-10 ENCOUNTER — Encounter: Payer: Self-pay | Admitting: *Deleted

## 2012-06-12 ENCOUNTER — Encounter: Payer: Medicaid Other | Admitting: Obstetrics and Gynecology

## 2012-06-12 ENCOUNTER — Inpatient Hospital Stay (HOSPITAL_COMMUNITY)
Admission: AD | Admit: 2012-06-12 | Discharge: 2012-06-12 | Disposition: A | Discharge status: Home / Self Care | Payer: Medicaid Other | Source: Ambulatory Visit | Attending: Obstetrics and Gynecology | Admitting: Obstetrics and Gynecology

## 2012-06-12 ENCOUNTER — Encounter (HOSPITAL_COMMUNITY): Payer: Self-pay

## 2012-06-12 DIAGNOSIS — O99891 Other specified diseases and conditions complicating pregnancy: Secondary | ICD-10-CM | POA: Insufficient documentation

## 2012-06-12 DIAGNOSIS — O0930 Supervision of pregnancy with insufficient antenatal care, unspecified trimester: Secondary | ICD-10-CM | POA: Insufficient documentation

## 2012-06-12 DIAGNOSIS — Z22330 Carrier of Group B streptococcus: Secondary | ICD-10-CM | POA: Insufficient documentation

## 2012-06-12 DIAGNOSIS — O479 False labor, unspecified: Secondary | ICD-10-CM

## 2012-06-12 DIAGNOSIS — R109 Unspecified abdominal pain: Secondary | ICD-10-CM | POA: Insufficient documentation

## 2012-06-12 DIAGNOSIS — R51 Headache: Secondary | ICD-10-CM | POA: Insufficient documentation

## 2012-06-12 LAB — URINALYSIS, ROUTINE W REFLEX MICROSCOPIC
Bilirubin Urine: NEGATIVE
Glucose, UA: NEGATIVE mg/dL
Hgb urine dipstick: NEGATIVE
Protein, ur: NEGATIVE mg/dL
Urobilinogen, UA: 0.2 mg/dL (ref 0.0–1.0)

## 2012-06-12 MED ORDER — ACETAMINOPHEN 325 MG PO TABS
650.0000 mg | ORAL_TABLET | Freq: Once | ORAL | Status: AC
  Administered 2012-06-12: 650 mg via ORAL

## 2012-06-12 MED ORDER — DEXAMETHASONE SODIUM PHOSPHATE 10 MG/ML IJ SOLN: 10.0000 mg | Freq: Once | INTRAMUSCULAR | Status: DC

## 2012-06-12 MED ORDER — LACTATED RINGERS IV BOLUS (SEPSIS): 1000.0000 mL | Freq: Once | INTRAVENOUS | Status: DC

## 2012-06-12 MED ORDER — METOCLOPRAMIDE HCL 5 MG/ML IJ SOLN: 10.0000 mg | Freq: Once | INTRAMUSCULAR | Status: DC

## 2012-06-12 MED ORDER — DIPHENHYDRAMINE HCL 50 MG/ML IJ SOLN: 25.0000 mg | Freq: Once | INTRAMUSCULAR | Status: DC

## 2012-06-12 NOTE — MAU Provider Note (Signed)
I have seen and examined this patient and agree the above assessment. CRESENZO-DISHMAN,Ossie Yebra 06/12/2012 10:18 PM

## 2012-06-12 NOTE — MAU Provider Note (Signed)
Chief Complaint:  Headache and Vaginal Bleeding   None     HPI: Patricia Dunn is a 26 y.o. Z6X0960 at [redacted]w[redacted]d by LMP not consistent with 25week [place] who presents to maternity admissions reporting headache and some lower abdominal pain. Pt has extremely limited prenatal care and has been getting what little care she has from the MAU.  States has had a headache for the last week or more. Intermittent. Seen here one week ago and had tylenol that helped some.   Abdominal pain feels occasionally like it is a contraction but mostly very mild and irregular.    Denies, leakage of fluid or vaginal bleeding (except a slight blood tinge on toilet paper once today after urinating). Good fetal movement.   Pregnancy Course:   Past Medical History: Past Medical History  Diagnosis Date  . Asthma   . Headache     migraines    Past obstetric history: OB History    Grav Para Term Preterm Abortions TAB SAB Ect Mult Living   7 4 4  0 2 0 2 0 0 4     # Outc Date GA Lbr Len/2nd Wgt Sex Del Anes PTL Lv   1 SAB            2 SAB            3 TRM            4 TRM            5 TRM            6 TRM            7 CUR               Past Surgical History: Past Surgical History  Procedure Date  . Vaginal delivery     X 4  . No past surgeries     Family History: Family History  Problem Relation Age of Onset  . Other Neg Hx     Social History: History  Substance Use Topics  . Smoking status: Current Everyday Smoker -- 1.0 packs/day    Types: Cigarettes  . Smokeless tobacco: Never Used  . Alcohol Use: Yes     Beginning of pregnancy    Allergies: No Known Allergies  Meds:  Prescriptions prior to admission  Medication Sig Dispense Refill  . acetaminophen (TYLENOL) 500 MG tablet Take 500 mg by mouth every 6 (six) hours as needed. Tooth ache        ROS: Pertinent findings in history of present illness.  Physical Exam  Blood pressure 128/66, pulse 95, temperature 98.6 F (37 C),  temperature source Oral, resp. rate 20, weight 65.772 kg (145 lb), last menstrual period 09/15/2011. GENERAL: Well-developed, well-nourished female in no acute distress.  HEENT: normocephalic HEART: normal rate RESP: normal effort ABDOMEN: Soft, non-tender, gravid appropriate for gestational age EXTREMITIES: Nontender, no edema NEURO: alert and oriented Dilation: 1 Effacement (%): Thick Cervical Position: Middle Station: -3 Presentation: Vertex     FHT:  Baseline 130s , moderate variability, accelerations present, no decelerations Contractions:  Irregular    Labs: Results for orders placed during the hospital encounter of 06/12/12 (from the past 24 hour(s))  URINALYSIS, ROUTINE W REFLEX MICROSCOPIC     Status: Normal   Collection Time   06/12/12  5:45 PM      Component Value Range   Color, Urine YELLOW  YELLOW   APPearance CLEAR  CLEAR   Specific Gravity,  Urine 1.015  1.005 - 1.030   pH 6.5  5.0 - 8.0   Glucose, UA NEGATIVE  NEGATIVE mg/dL   Hgb urine dipstick NEGATIVE  NEGATIVE   Bilirubin Urine NEGATIVE  NEGATIVE   Ketones, ur NEGATIVE  NEGATIVE mg/dL   Protein, ur NEGATIVE  NEGATIVE mg/dL   Urobilinogen, UA 0.2  0.0 - 1.0 mg/dL   Nitrite NEGATIVE  NEGATIVE   Leukocytes, UA NEGATIVE  NEGATIVE    Imaging:  No results found. ED Course   Assessment: 1. Prenatal care insufficient     Plan:  1) headache - no red flag symptoms  - pt comfortable in exam room - offered migraine cocktail but pt requested two tylenol and to be discharged - tylenol given  2) abdominal pain - ua negative. Likely braxton-hicks ctx.  - irregular. No cervical change - advised increasing fluids - strict labor precautions discussed  3) fetus - category I tracing - GBS +   4) urged importance of follow up in [facility]   Discharge home Labor precautions and fetal kick counts  Medication List  As of 06/12/2012  6:41 PM   ASK your doctor about these medications         acetaminophen 500  MG tablet   Commonly known as: TYLENOL   Take 500 mg by mouth every 6 (six) hours as needed. Tooth ache            Rulon Abide Medical Resident 06/12/2012 6:41 PM

## 2012-06-12 NOTE — MAU Note (Signed)
Saw blood when used bathroom today- one time only.   Has a really bad headache today.

## 2012-06-14 ENCOUNTER — Encounter: Payer: Self-pay | Admitting: Family

## 2012-06-17 ENCOUNTER — Encounter (HOSPITAL_COMMUNITY): Payer: Self-pay | Admitting: *Deleted

## 2012-06-17 ENCOUNTER — Inpatient Hospital Stay (HOSPITAL_COMMUNITY)
Admission: AD | Admit: 2012-06-17 | Discharge: 2012-06-17 | Disposition: A | Discharge status: Home / Self Care | Payer: Medicaid Other | Source: Ambulatory Visit | Attending: Obstetrics & Gynecology | Admitting: Obstetrics & Gynecology

## 2012-06-17 DIAGNOSIS — O99891 Other specified diseases and conditions complicating pregnancy: Secondary | ICD-10-CM | POA: Insufficient documentation

## 2012-06-17 DIAGNOSIS — R109 Unspecified abdominal pain: Secondary | ICD-10-CM | POA: Insufficient documentation

## 2012-06-17 DIAGNOSIS — G44209 Tension-type headache, unspecified, not intractable: Secondary | ICD-10-CM

## 2012-06-17 DIAGNOSIS — R51 Headache: Secondary | ICD-10-CM | POA: Insufficient documentation

## 2012-06-17 DIAGNOSIS — Z22330 Carrier of Group B streptococcus: Secondary | ICD-10-CM | POA: Insufficient documentation

## 2012-06-17 DIAGNOSIS — O0930 Supervision of pregnancy with insufficient antenatal care, unspecified trimester: Secondary | ICD-10-CM

## 2012-06-17 LAB — URINALYSIS, ROUTINE W REFLEX MICROSCOPIC
Bilirubin Urine: NEGATIVE
Glucose, UA: NEGATIVE mg/dL
Ketones, ur: NEGATIVE mg/dL
pH: 6 (ref 5.0–8.0)

## 2012-06-17 LAB — RAPID URINE DRUG SCREEN, HOSP PERFORMED
Amphetamines: NOT DETECTED
Barbiturates: NOT DETECTED
Benzodiazepines: NOT DETECTED

## 2012-06-17 MED ORDER — BUTALBITAL-APAP-CAFFEINE 50-325-40 MG PO TABS: 2.0000 | ORAL_TABLET | Freq: Four times a day (QID) | ORAL | Status: AC | PRN

## 2012-06-17 MED ORDER — BUTALBITAL-APAP-CAFFEINE 50-325-40 MG PO TABS
2.0000 | ORAL_TABLET | Freq: Once | ORAL | Status: AC
  Administered 2012-06-17: 2 via ORAL
  Filled 2012-06-17: qty 2

## 2012-06-17 NOTE — MAU Note (Signed)
Having a headache for a week.  Called doctors office because she passed her mucus plug. Denies any water.  Complains of swelling in feet

## 2012-06-17 NOTE — MAU Provider Note (Signed)
History     CSN: [id number]  Arrival date and time: 06/17/12 1324   First Provider Initiated Contact with Patient 06/17/12 2018      Chief Complaint  Patient presents with  . Edema  . Headache   HPI  This is a 26 y.o. M0N0272 at [redacted]w[redacted]d with history of pre-eclampsia in first pregnancy here with 10/10 headache since start of pregnancy and some spots in her visual field.  Has not tried any medication for pain today.  Also has had foot swelling x 3 days, left>right,  Pt passed her mucus plug the same day.  Denies gush of clear fluid but underwear has constantly felt damp.  Denies contractions or vaginal bleeding and feels normal fetal movement.  Has had some increased vaginal discharge that is non-odorous, but denies pain, itchiness, or discomfort.    Denies neurologic changes, CP, sob, fevers, chills.  Has been chewing on ice more recently.  Complains of some epigastric and lower abdominal pain.  Was late to start prenatal care and has been to MAU multiple times; has appt in clinic in 2 days.   Past Medical History  Diagnosis Date  . Asthma   . Headache     migraines  SAB 1st two pregnancies, pre-eclampsia with third.  Otherwise, normal pregnancies, NSVD, full-term.  Past Surgical History  Procedure Date  . Vaginal delivery     X 4  . No past surgeries     Family History  Problem Relation Age of Onset  . Other Neg Hx     History  Substance Use Topics  . Smoking status: Current Everyday Smoker -- 1.0 packs/day    Types: Cigarettes  . Smokeless tobacco: Never Used  . Alcohol Use: Yes     Beginning of pregnancy  1/2 ppd x 9 years Pt lives with her four children  Allergies: No Known Allergies  Prescriptions prior to admission  Medication Sig Dispense Refill  . acetaminophen (TYLENOL) 500 MG tablet Take 500-1,000 mg by mouth every 6 (six) hours as needed. For pain or tooth ache      No PNV  ROS Per HPI; otherwise wnl. Physical Exam   Blood pressure 133/67, pulse  81, temperature 98.1 F (36.7 C), temperature source Oral, resp. rate 18, height 5' 2.99" (1.6 m), weight 66.951 kg (147 lb 9.6 oz), last menstrual period 09/15/2011.  Physical Exam GEN: NAD, sitting in room appearing comfortable HEENT: PERRL, EOMI, though mild photophobia and eyes watering and slightly red CV: RRR, 2+ bilateral DP pulses PULM: CTAB with some coarse breath sounds throughout ABD: soft, gravid, nontender, [place] with fetus vertex EXTR: 1+ bilateral LE edema to ankles, L slightly > R NEURO: CN 2-12 tested and in tact, alert and oriented Cervical check: 1/40/-3 Fetal tracing: 140s, moderate variability, accelerations present, no decelerations, category I  MAU Course  Procedures  MDM: Fioricet for headache in MAU UDS  Assessment and Plan  This is a 25 y.o. [id number] at [redacted]w[redacted]d with history of pre-eclampsia in first pregnancy here with 10/10 headache since start of pregnancy and some spots in her visual field.   1) headache  - BP wnl and no red flag symptoms besides mild photophobia; likely tension v. migraine headache, worsened by tobacco use - UA negative for protein - pt comfortable in exam room  - fioricet given in MAU and prescription given - UDS done to see if cocaine or marijuana which could help explain headache  2) abdominal pain  - ua negative. Likely  round ligament pain.  - contractions irregular. No cervical change  - advised increasing fluids  - labor precautions discussed   3) fetus  - category I tracing  - GBS +   4) Pt planning f/u in [facility] Wednesday - obtain f/u CBC as pt complaining of chewing on ice and not taking PNV  5) Contraception - consent for BTL signed 06/05/12   Simone Curia 06/17/2012, 8:43 PM

## 2012-06-25 ENCOUNTER — Inpatient Hospital Stay (HOSPITAL_COMMUNITY)
Admission: AD | Admit: 2012-06-25 | Discharge: 2012-06-25 | Disposition: A | Discharge status: Home / Self Care | Payer: Medicaid Other | Source: Ambulatory Visit | Attending: Obstetrics & Gynecology | Admitting: Obstetrics & Gynecology

## 2012-06-25 ENCOUNTER — Encounter (HOSPITAL_COMMUNITY): Payer: Self-pay | Admitting: *Deleted

## 2012-06-25 DIAGNOSIS — N949 Unspecified condition associated with female genital organs and menstrual cycle: Secondary | ICD-10-CM | POA: Insufficient documentation

## 2012-06-25 DIAGNOSIS — O0930 Supervision of pregnancy with insufficient antenatal care, unspecified trimester: Secondary | ICD-10-CM

## 2012-06-25 DIAGNOSIS — O99891 Other specified diseases and conditions complicating pregnancy: Secondary | ICD-10-CM | POA: Insufficient documentation

## 2012-06-25 LAB — AMNISURE RUPTURE OF MEMBRANE (ROM) NOT AT ARMC: Amnisure ROM: NEGATIVE

## 2012-06-25 NOTE — MAU Provider Note (Signed)
History     CSN: [id number]  Arrival date and time: 06/25/12 1814   First Provider Initiated Contact with Patient 06/25/12 2045      Chief Complaint  Patient presents with  . Rupture of Membranes   HPI  This is a 26 y.o. [id number] at [redacted]w[redacted]d here with leaking fluid from her vagina.  Pt felt like a gush of fluid came from her vagina at 3pm today and saw some liquid on the bed and her underwear.  She denies contractions but reports lower abdominal pain that came and went but vaginal pressure that has been constant.  Denies vaginal bleeding.  + fetal movement.  Denies CP, fever, SOB.  Pt has not had prenatal care this pregnancy.  Had an appointment in [facility] yesterday but missed this appt. GBS negative.   Past Medical History  Diagnosis Date  . Asthma   . Headache     migraines    Past Surgical History  Procedure Date  . Vaginal delivery     X 4  . No past surgeries     Family History  Problem Relation Age of Onset  . Other Neg Hx     History  Substance Use Topics  . Smoking status: Current Everyday Smoker -- 1.0 packs/day    Types: Cigarettes  . Smokeless tobacco: Never Used  . Alcohol Use: Yes     Beginning of pregnancy    Allergies: No Known Allergies  Prescriptions prior to admission  Medication Sig Dispense Refill  . acetaminophen (TYLENOL) 500 MG tablet Take 500 mg by mouth every 6 (six) hours as needed. For pain      . butalbital-acetaminophen-caffeine (FIORICET, ESGIC) 50-325-40 MG per tablet Take 2 tablets by mouth every 6 (six) hours as needed for headache.  14 tablet  1    ROS Per HPI; otherwise, negative.  Physical Exam   Blood pressure 126/59, pulse 70, temperature 98.8 F (37.1 C), temperature source Oral, resp. rate 18, last menstrual period 09/15/2011.  Physical Exam GEN: NAD CV: 2+ bilateral DP pulses, 1+ bilateral pitting edema to ankles PULM: nl effort PELVIC: no pooling, small amount of thin whitish discharge; manual  cervical check 1cm external os/closed internal os/thick/high Fetal strip: some irritability, possible contraction 10-74m apt, 130s BPM, +accelerations, moderate variability, no decelerations, category I tracing  MAU Course  Procedures  MDM Results for orders placed during the hospital encounter of 06/25/12 (from the past 24 hour(s))  AMNISURE RUPTURE OF MEMBRANE (ROM)     Status: Normal   Collection Time   06/25/12  8:52 PM      Component Value Range   Amnisure ROM NEGATIVE    POCT FERN TEST     Status: Normal   Collection Time   06/25/12  9:24 PM      Component Value Range   Fern Test Negative       Assessment and Plan  This is a 26 y.o. J4N8295 at [redacted]w[redacted]d here with leaking fluid from her vagina.   # Vaginal discharge - given negative fern test and amnisure as well as no pooling, pt likely has not ruptured membranes  # Supervision of low risk pregnancy -  - Strongly encouraged f/u at [facility] for prenatal care until delivery - Pt wants BTL and signed consent  # Fetal wellbeing - Category I tracing  Patricia Dunn 06/25/2012, 9:35 PM   I have seen and examined this patient and I agree with the above. Patricia Dunn 8:10 AM  06/26/2012    

## 2012-06-25 NOTE — MAU Note (Signed)
Water broke at 3pm.

## 2012-07-06 ENCOUNTER — Encounter (HOSPITAL_COMMUNITY): Payer: Self-pay | Admitting: *Deleted

## 2012-07-06 ENCOUNTER — Inpatient Hospital Stay (HOSPITAL_COMMUNITY): Payer: Medicaid Other

## 2012-07-06 ENCOUNTER — Inpatient Hospital Stay (HOSPITAL_COMMUNITY)
Admission: AD | Admit: 2012-07-06 | Discharge: 2012-07-10 | DRG: 767 | Disposition: A | Discharge status: Home / Self Care | Payer: Medicaid Other | Source: Ambulatory Visit | Attending: Obstetrics & Gynecology | Admitting: Obstetrics & Gynecology

## 2012-07-06 DIAGNOSIS — O4100X0 Oligohydramnios, unspecified trimester, not applicable or unspecified: Secondary | ICD-10-CM

## 2012-07-06 DIAGNOSIS — O99344 Other mental disorders complicating childbirth: Secondary | ICD-10-CM | POA: Diagnosis present

## 2012-07-06 DIAGNOSIS — F1410 Cocaine abuse, uncomplicated: Secondary | ICD-10-CM | POA: Diagnosis present

## 2012-07-06 DIAGNOSIS — O4590 Premature separation of placenta, unspecified, unspecified trimester: Principal | ICD-10-CM | POA: Diagnosis present

## 2012-07-06 DIAGNOSIS — Z302 Encounter for sterilization: Secondary | ICD-10-CM

## 2012-07-06 DIAGNOSIS — O878 Other venous complications in the puerperium: Secondary | ICD-10-CM | POA: Diagnosis present

## 2012-07-06 DIAGNOSIS — O0930 Supervision of pregnancy with insufficient antenatal care, unspecified trimester: Secondary | ICD-10-CM

## 2012-07-06 DIAGNOSIS — K649 Unspecified hemorrhoids: Secondary | ICD-10-CM | POA: Diagnosis present

## 2012-07-06 LAB — WET PREP, GENITAL

## 2012-07-06 MED ORDER — FLEET ENEMA 7-19 GM/118ML RE ENEM: 1.0000 | ENEMA | RECTAL | Status: DC | PRN

## 2012-07-06 MED ORDER — CITRIC ACID-SODIUM CITRATE 334-500 MG/5ML PO SOLN: 30.0000 mL | ORAL | Status: DC | PRN

## 2012-07-06 MED ORDER — OXYTOCIN 40 UNITS IN LACTATED RINGERS INFUSION - SIMPLE MED
62.5000 mL/h | Freq: Once | INTRAVENOUS | Status: AC
  Administered 2012-07-08: 999 mL/h via INTRAVENOUS

## 2012-07-06 MED ORDER — ACETAMINOPHEN 325 MG PO TABS
650.0000 mg | ORAL_TABLET | ORAL | Status: DC | PRN
  Administered 2012-07-07 – 2012-07-08 (×4): 650 mg via ORAL
  Filled 2012-07-06 (×4): qty 2

## 2012-07-06 MED ORDER — IBUPROFEN 600 MG PO TABS
600.0000 mg | ORAL_TABLET | Freq: Four times a day (QID) | ORAL | Status: DC | PRN
  Administered 2012-07-08: 600 mg via ORAL
  Filled 2012-07-06: qty 1

## 2012-07-06 MED ORDER — LACTATED RINGERS IV BOLUS (SEPSIS)
1000.0000 mL | Freq: Once | INTRAVENOUS | Status: AC
  Administered 2012-07-06: 1000 mL via INTRAVENOUS

## 2012-07-06 MED ORDER — OXYCODONE-ACETAMINOPHEN 5-325 MG PO TABS
1.0000 | ORAL_TABLET | ORAL | Status: DC | PRN
  Filled 2012-07-06: qty 1

## 2012-07-06 MED ORDER — LACTATED RINGERS IV SOLN: 500.0000 mL | INTRAVENOUS | Status: DC | PRN

## 2012-07-06 MED ORDER — HYDROXYZINE HCL 50 MG PO TABS: 50.0000 mg | ORAL_TABLET | Freq: Four times a day (QID) | ORAL | Status: DC | PRN

## 2012-07-06 MED ORDER — HYDROXYZINE HCL 50 MG/ML IM SOLN: 50.0000 mg | Freq: Four times a day (QID) | INTRAMUSCULAR | Status: DC | PRN

## 2012-07-06 MED ORDER — NALBUPHINE SYRINGE 5 MG/0.5 ML
5.0000 mg | INJECTION | INTRAMUSCULAR | Status: DC | PRN
  Administered 2012-07-07 (×2): 5 mg via INTRAVENOUS
  Filled 2012-07-06 (×2): qty 0.5

## 2012-07-06 MED ORDER — LIDOCAINE HCL (PF) 1 % IJ SOLN: 30.0000 mL | INTRAMUSCULAR | Status: DC | PRN

## 2012-07-06 MED ORDER — ONDANSETRON HCL 4 MG/2ML IJ SOLN
4.0000 mg | Freq: Four times a day (QID) | INTRAMUSCULAR | Status: DC | PRN
  Administered 2012-07-07: 4 mg via INTRAVENOUS
  Filled 2012-07-06: qty 2

## 2012-07-06 MED ORDER — OXYTOCIN BOLUS FROM INFUSION
500.0000 mL | Freq: Once | INTRAVENOUS | Status: DC
  Filled 2012-07-06: qty 500

## 2012-07-06 MED ORDER — LACTATED RINGERS IV SOLN
INTRAVENOUS | Status: DC
  Administered 2012-07-07 (×2): via INTRAVENOUS

## 2012-07-06 NOTE — MAU Provider Note (Signed)
 History   Patricia Dunn is a 26 y.o. G7P4024 female at [redacted]w[redacted]d with no prenatal care who presents w/ report of small amount bright red vaginal bleeding 2 days ago w/ quarter-sized dark red/brown clot today.  Reports good fm.  Denies uc's or lof.  States she had large gush of fluid on 9/10, but had neg fern and amniosure, occ moist underwear since.  Denies illicit drug use.  UDS +for cocaine on 06/17/12.    CSN: [id number]  Arrival date and time: 07/06/12 2127   First Provider Initiated Contact with Patient 07/06/12 2152      Chief Complaint  Patient presents with  . Vaginal Bleeding   HPI  OB History    Grav Para Term Preterm Abortions TAB SAB Ect Mult Living   7 4 4 0 2 0 2 0 0 4      Past Medical History  Diagnosis Date  . Asthma   . Headache     migraines    Past Surgical History  Procedure Date  . Vaginal delivery     X 4  . No past surgeries     Family History  Problem Relation Age of Onset  . Other Neg Hx     History  Substance Use Topics  . Smoking status: Current Every Day Smoker -- 1.0 packs/day    Types: Cigarettes  . Smokeless tobacco: Never Used  . Alcohol Use: Yes     Beginning of pregnancy    Allergies: No Known Allergies  Prescriptions prior to admission  Medication Sig Dispense Refill  . acetaminophen (TYLENOL) 500 MG tablet Take 500 mg by mouth every 6 (six) hours as needed. For pain        Review of Systems  Constitutional: Negative.   Eyes: Negative.   Respiratory: Negative.   Cardiovascular: Negative.   Gastrointestinal: Positive for nausea, vomiting and diarrhea (yesterday, denies today).  Genitourinary: Negative.   Musculoskeletal: Negative.   Skin: Negative.   Neurological: Positive for headaches (occasional, denies at present).  Endo/Heme/Allergies: Negative.   Psychiatric/Behavioral: Negative.    Physical Exam   Blood pressure 132/59, pulse 72, temperature 98.9 F (37.2 C), temperature source Oral, resp. rate 18, last  menstrual period 09/15/2011.  Physical Exam  Constitutional: She is oriented to person, place, and time. She appears well-developed and well-nourished.  HENT:  Head: Normocephalic.  Neck: Normal range of motion.  Cardiovascular: Normal rate and regular rhythm.   Respiratory: Effort normal.  GI: Soft. There is no tenderness.       gravid  Genitourinary: Vagina normal and uterus normal.       Spec exam: cervix visually closed, no active bleeding, brown-tinged mucous in vaginal vault  SVE: loose 1/50/-1 vtx  Musculoskeletal: Normal range of motion. She exhibits no edema.  Neurological: She is alert and oriented to person, place, and time. She has normal reflexes.  Skin: Skin is warm and dry.  Psychiatric: She has a normal mood and affect. Her behavior is normal. Judgment and thought content normal.    MAU Course  Procedures  EFM Spec exam w/ wet prep & gc/ct obtained SVE: loose 1/50/-1 vtx UDS US for BPP/AFI/placenta: no previa/abruption, AFI: 0, BPP 6/8, vtx Discussed w/ Dr. Anyanwu  Results for orders placed during the hospital encounter of 07/06/12 (from the past 24 hour(s))  WET PREP, GENITAL     Status: Abnormal   Collection Time   07/06/12 10:00 PM      Component Value Range     Yeast Wet Prep HPF POC NONE SEEN  NONE SEEN   Trich, Wet Prep NONE SEEN  NONE SEEN   Clue Cells Wet Prep HPF POC FEW (*) NONE SEEN   WBC, Wet Prep HPF POC FEW (*) NONE SEEN  CBC     Status: Abnormal   Collection Time   07/07/12 12:10 AM      Component Value Range   WBC 11.7 (*) 4.0 - 10.5 K/uL   RBC 4.19  3.87 - 5.11 MIL/uL   Hemoglobin 11.2 (*) 12.0 - 15.0 g/dL   HCT 35.1 (*) 36.0 - 46.0 %   MCV 83.8  78.0 - 100.0 fL   MCH 26.7  26.0 - 34.0 pg   MCHC 31.9  30.0 - 36.0 g/dL   RDW 14.5  11.5 - 15.5 %   Platelets 215  150 - 400 K/uL     Assessment and Plan  A:  [redacted]w[redacted]d SIUP  No prenatal care  Anhydramnios  Cocaine abuse, today's uds pending  VB- presumed partial marginal  abruption  GBS unknown   P:   Admit to BS for IOL d/t anhydramnios  GBS PCR  Anticipate NSVD      Patricia Dunn 07/06/2012, 10:30 PM   

## 2012-07-06 NOTE — MAU Note (Signed)
Pt reports that she had bright red vaginal bleeding a couple of days ago. Today pt noticed a quarter sized blood clot and some dark bleeding.

## 2012-07-07 ENCOUNTER — Inpatient Hospital Stay (HOSPITAL_COMMUNITY): Payer: Medicaid Other | Admitting: Anesthesiology

## 2012-07-07 ENCOUNTER — Encounter (HOSPITAL_COMMUNITY): Payer: Self-pay | Admitting: *Deleted

## 2012-07-07 ENCOUNTER — Encounter (HOSPITAL_COMMUNITY): Payer: Self-pay | Admitting: Anesthesiology

## 2012-07-07 LAB — RAPID URINE DRUG SCREEN, HOSP PERFORMED
Amphetamines: NOT DETECTED
Benzodiazepines: NOT DETECTED
Opiates: NOT DETECTED

## 2012-07-07 LAB — CBC
Hemoglobin: 11.2 g/dL — ABNORMAL LOW (ref 12.0–15.0)
MCV: 83.8 fL (ref 78.0–100.0)
Platelets: 215 10*3/uL (ref 150–400)
RBC: 4.19 MIL/uL (ref 3.87–5.11)
WBC: 11.7 10*3/uL — ABNORMAL HIGH (ref 4.0–10.5)

## 2012-07-07 LAB — RPR: RPR Ser Ql: NONREACTIVE

## 2012-07-07 LAB — TYPE AND SCREEN

## 2012-07-07 LAB — RUBELLA SCREEN: Rubella: 53 IU/mL — ABNORMAL HIGH

## 2012-07-07 LAB — OB RESULTS CONSOLE GBS: GBS: NEGATIVE

## 2012-07-07 LAB — GROUP B STREP BY PCR: Group B strep by PCR: NEGATIVE

## 2012-07-07 MED ORDER — PHENYLEPHRINE 40 MCG/ML (10ML) SYRINGE FOR IV PUSH (FOR BLOOD PRESSURE SUPPORT): 80.0000 ug | PREFILLED_SYRINGE | INTRAVENOUS | Status: DC | PRN

## 2012-07-07 MED ORDER — FENTANYL 2.5 MCG/ML BUPIVACAINE 1/10 % EPIDURAL INFUSION (WH - ANES)
14.0000 mL/h | INTRAMUSCULAR | Status: DC
  Administered 2012-07-07 – 2012-07-08 (×6): 14 mL/h via EPIDURAL
  Filled 2012-07-07 (×7): qty 60

## 2012-07-07 MED ORDER — TERBUTALINE SULFATE 1 MG/ML IJ SOLN: 0.2500 mg | Freq: Once | INTRAMUSCULAR | Status: AC | PRN

## 2012-07-07 MED ORDER — MISOPROSTOL 25 MCG QUARTER TABLET
25.0000 ug | ORAL_TABLET | ORAL | Status: AC
  Administered 2012-07-07: 25 ug via VAGINAL
  Filled 2012-07-07 (×2): qty 0.25

## 2012-07-07 MED ORDER — LACTATED RINGERS IV SOLN
500.0000 mL | Freq: Once | INTRAVENOUS | Status: AC
  Administered 2012-07-07: 500 mL via INTRAVENOUS

## 2012-07-07 MED ORDER — DIPHENHYDRAMINE HCL 50 MG/ML IJ SOLN
12.5000 mg | INTRAMUSCULAR | Status: DC | PRN
  Administered 2012-07-07 (×2): 12.5 mg via INTRAVENOUS
  Filled 2012-07-07 (×2): qty 1

## 2012-07-07 MED ORDER — LIDOCAINE HCL (PF) 1 % IJ SOLN
INTRAMUSCULAR | Status: DC | PRN
  Administered 2012-07-07 (×3): 4 mL

## 2012-07-07 MED ORDER — EPHEDRINE 5 MG/ML INJ
10.0000 mg | INTRAVENOUS | Status: DC | PRN
  Filled 2012-07-07: qty 4

## 2012-07-07 MED ORDER — EPHEDRINE 5 MG/ML INJ: 10.0000 mg | INTRAVENOUS | Status: DC | PRN

## 2012-07-07 MED ORDER — OXYTOCIN 40 UNITS IN LACTATED RINGERS INFUSION - SIMPLE MED
1.0000 m[IU]/min | INTRAVENOUS | Status: DC
  Administered 2012-07-07: 6 m[IU]/min via INTRAVENOUS
  Administered 2012-07-07: 1 m[IU]/min via INTRAVENOUS
  Filled 2012-07-07: qty 1000

## 2012-07-07 MED ORDER — OXYTOCIN 40 UNITS IN LACTATED RINGERS INFUSION - SIMPLE MED: 1.0000 m[IU]/min | INTRAVENOUS | Status: DC

## 2012-07-07 MED ORDER — TERBUTALINE SULFATE 1 MG/ML IJ SOLN: 0.2500 mg | Freq: Once | INTRAMUSCULAR | Status: DC | PRN

## 2012-07-07 MED ORDER — PHENYLEPHRINE 40 MCG/ML (10ML) SYRINGE FOR IV PUSH (FOR BLOOD PRESSURE SUPPORT)
80.0000 ug | PREFILLED_SYRINGE | INTRAVENOUS | Status: DC | PRN
  Filled 2012-07-07: qty 5

## 2012-07-07 MED ORDER — NICOTINE 21 MG/24HR TD PT24
21.0000 mg | MEDICATED_PATCH | Freq: Every day | TRANSDERMAL | Status: DC
  Filled 2012-07-07 (×5): qty 1

## 2012-07-07 NOTE — Progress Notes (Signed)
Case discussed, care plan agreed upon and cleared thru Anesthesiology. Will use Cytotec x 1-2 q 4h then resume pitocin if needed .

## 2012-07-07 NOTE — H&P (Signed)
Attestation of Attending Supervision of Advanced Practitioner (CNM/NP): Evaluation and management procedures were performed by the Advanced Practitioner under my supervision and collaboration.  I have reviewed the Advanced Practitioner's note and chart, and I agree with the management and plan.  Fredericka Bottcher, MD, FACOG Attending Obstetrician & Gynecologist Faculty Practice, [facility]  

## 2012-07-07 NOTE — MAU Provider Note (Signed)
Attestation of Attending Supervision of Advanced Practitioner (CNM/NP): Evaluation and management procedures were performed by the Advanced Practitioner under my supervision and collaboration.  I have reviewed the Advanced Practitioner's note and chart, and I agree with the management and plan.  Cherly Erno, MD, FACOG Attending Obstetrician & Gynecologist Faculty Practice, [facility]  

## 2012-07-07 NOTE — Progress Notes (Signed)
Patricia Dunn is a 26 y.o. J1B1478 at [redacted]w[redacted]d admitted for induction of labor due to anhydramnios.  Subjective: No complaints.  Friend/family member supportive at bs  Objective: BP 134/111  Pulse 80  Temp 98.7 F (37.1 C) (Oral)  Resp 18  LMP 09/15/2011      FHT:  FHR: 120 bpm, variability: moderate,  accelerations:  Present,  decelerations:  Absent UC:  occasional SVE:   Dilation: 1 Effacement (%): 50 Station: -2 Exam by:: Feleica Fulmore, K  Cervical foley bulb placed and inflated w/ 60ml water w/o difficulty  Labs: Lab Results  Component Value Date   WBC 11.7* 07/07/2012   HGB 11.2* 07/07/2012   HCT 35.1* 07/07/2012   MCV 83.8 07/07/2012   PLT 215 07/07/2012    Assessment / Plan: IOL d/t anhydramnios, cervical foley bulb in place, will begin pitocin per low-dose protocol to max of 11mu/min while foley bulb in.  RN may increase pitocin per low-dose protocol when foley bulb falls out  Labor: IOL cervical ripening stage Preeclampsia:  n/a Fetal Wellbeing:  Category I Pain Control:  n/a I/D:  awaiting GBS PCR results Anticipated MOD:  NSVD  [name] 07/07/2012, 12:55 AM

## 2012-07-07 NOTE — Progress Notes (Signed)
Patricia Dunn is a 26 y.o. Z6X0960 at [redacted]w[redacted]d.  Subjective: Comfortable w/ epidural  Objective: BP 106/54  Pulse 65  Temp 97.9 F (36.6 C) (Oral)  Resp 18  Ht 5' 2.99" (1.6 m)  Wt 68.04 kg (150 lb)  BMI 26.58 kg/m2  SpO2 99%  LMP 09/15/2011      FHT:  FHR: 115 bpm, variability: moderate,  accelerations:  Present,  decelerations:  Absent UC:   regular, every 4-5 minutes, moderate SVE:   Dilation: 5 Effacement (%): 70 Station: -3 Exam by:: Erline Hau RNC  Labs: Lab Results  Component Value Date   WBC 11.7* 07/07/2012   HGB 11.2* 07/07/2012   HCT 35.1* 07/07/2012   MCV 83.8 07/07/2012   PLT 215 07/07/2012    Assessment / Plan: Induction of labor due to anhydramnios,  progressing well on pitocin  Labor: Progressing on Pitocin by 2 milliUnits/min. Preeclampsia:  NA Fetal Wellbeing:  Category I Pain Control:  Epidural I/D:  n/a Anticipated MOD:  NSVD  Kam Rahimi 07/07/2012, 10:45 AM

## 2012-07-07 NOTE — Progress Notes (Signed)
Patricia Dunn is a 26 y.o. [id number] at [redacted]w[redacted]d admitted for IOL due to vaginal bleeding and possible ROM at term.  Subjective: Doing well with epidural.  Objective: BP 120/62  Pulse 62  Temp 98 F (36.7 C) (Oral)  Resp 18  Ht 5' 2.99" (1.6 m)  Wt 68.04 kg (150 lb)  BMI 26.58 kg/m2  SpO2 99%  LMP 09/15/2011      FHT:  FHR: 130s bpm, variability: moderate,  accelerations:  Present,  decelerations:  Present variables UC:   irregular, with irritability on monitor and pt unable to feel contractions SVE:   Dilation: 5 Effacement (%): 50 Station: -2 Exam by:: Dr. Emelda Fear  Labs: Lab Results  Component Value Date   WBC 11.7* 07/07/2012   HGB 11.2* 07/07/2012   HCT 35.1* 07/07/2012   MCV 83.8 07/07/2012   PLT 215 07/07/2012    Assessment / Plan: This is a 26 y.o. [id number] at [redacted]w[redacted]d with IUP here for IOL due to anhydramnios at term  Protracted labor  - Continue efm, continue epidural, pt with clears diet - s/p cytotec x 1 with soft cervix 50% effaced. - start pitocin at 2 milliunits/minute and increase by 3milliunits/minute - Anticipate SVD - Discussed with Dr. Emelda Fear Fetal Wellbeing:  Category II Pain Control:  Epidural   Simone Curia 07/07/2012, 8:57 PM

## 2012-07-07 NOTE — H&P (Signed)
History   Patricia Dunn is a 26 y.o. [phone] female at [redacted]w[redacted]d with no prenatal care who presents w/ report of small amount bright red vaginal bleeding 2 days ago w/ quarter-sized dark red/brown clot today.  Reports good fm.  Denies uc's or lof.  States she had large gush of fluid on 9/10, but had neg fern and amniosure, occ moist underwear since.  Denies illicit drug use.  UDS +for cocaine on 06/17/12.    CSN: [id number]  Arrival date and time: 07/06/12 2127   First Provider Initiated Contact with Patient 07/06/12 2152      Chief Complaint  Patient presents with  . Vaginal Bleeding   HPI  OB History    Grav Para Term Preterm Abortions TAB SAB Ect Mult Living   7 4 4  0 2 0 2 0 0 4      Past Medical History  Diagnosis Date  . Asthma   . Headache     migraines    Past Surgical History  Procedure Date  . Vaginal delivery     X 4  . No past surgeries     Family History  Problem Relation Age of Onset  . Other Neg Hx     History  Substance Use Topics  . Smoking status: Current Every Day Smoker -- 1.0 packs/day    Types: Cigarettes  . Smokeless tobacco: Never Used  . Alcohol Use: Yes     Beginning of pregnancy    Allergies: No Known Allergies  Prescriptions prior to admission  Medication Sig Dispense Refill  . acetaminophen (TYLENOL) 500 MG tablet Take 500 mg by mouth every 6 (six) hours as needed. For pain        Review of Systems  Constitutional: Negative.   Eyes: Negative.   Respiratory: Negative.   Cardiovascular: Negative.   Gastrointestinal: Positive for nausea, vomiting and diarrhea (yesterday, denies today).  Genitourinary: Negative.   Musculoskeletal: Negative.   Skin: Negative.   Neurological: Positive for headaches (occasional, denies at present).  Endo/Heme/Allergies: Negative.   Psychiatric/Behavioral: Negative.    Physical Exam   Blood pressure 132/59, pulse 72, temperature 98.9 F (37.2 C), temperature source Oral, resp. rate 18, last  menstrual period 09/15/2011.  Physical Exam  Constitutional: She is oriented to person, place, and time. She appears well-developed and well-nourished.  HENT:  Head: Normocephalic.  Neck: Normal range of motion.  Cardiovascular: Normal rate and regular rhythm.   Respiratory: Effort normal.  GI: Soft. There is no tenderness.       gravid  Genitourinary: Vagina normal and uterus normal.       Spec exam: cervix visually closed, no active bleeding, brown-tinged mucous in vaginal vault  SVE: loose 1/50/-1 vtx  Musculoskeletal: Normal range of motion. She exhibits no edema.  Neurological: She is alert and oriented to person, place, and time. She has normal reflexes.  Skin: Skin is warm and dry.  Psychiatric: She has a normal mood and affect. Her behavior is normal. Judgment and thought content normal.    MAU Course  Procedures  EFM Spec exam w/ wet prep & gc/ct obtained SVE: loose 1/50/-1 vtx UDS [place] for BPP/AFI/placenta: no previa/abruption, AFI: 0, BPP 6/8, vtx Discussed w/ Dr. Macon Large  Results for orders placed during the hospital encounter of 07/06/12 (from the past 24 hour(s))  WET PREP, GENITAL     Status: Abnormal   Collection Time   07/06/12 10:00 PM      Component Value Range  Yeast Wet Prep HPF POC NONE SEEN  NONE SEEN   Trich, Wet Prep NONE SEEN  NONE SEEN   Clue Cells Wet Prep HPF POC FEW (*) NONE SEEN   WBC, Wet Prep HPF POC FEW (*) NONE SEEN  CBC     Status: Abnormal   Collection Time   07/07/12 12:10 AM      Component Value Range   WBC 11.7 (*) 4.0 - 10.5 K/uL   RBC 4.19  3.87 - 5.11 MIL/uL   Hemoglobin 11.2 (*) 12.0 - 15.0 g/dL   HCT 04.5 (*) 40.9 - 81.1 %   MCV 83.8  78.0 - 100.0 fL   MCH 26.7  26.0 - 34.0 pg   MCHC 31.9  30.0 - 36.0 g/dL   RDW 91.4  78.2 - 95.6 %   Platelets 215  150 - 400 K/uL     Assessment and Plan  A:  [redacted]w[redacted]d SIUP  No prenatal care  Anhydramnios  Cocaine abuse, today's uds pending  VB- presumed partial marginal  abruption  GBS unknown   P:   Admit to BS for IOL d/t anhydramnios  GBS PCR  Anticipate NSVD      [name] 07/06/2012, 10:30 PM

## 2012-07-07 NOTE — Progress Notes (Signed)
Patricia Dunn is a 26 y.o. [id number] at [redacted]w[redacted]d admitted for IOL d/t vaginal bleeding and possible ROM.  Subjective: Comfortable w/epidural, wants to eat  Objective: BP 116/51  Pulse 67  Temp 98.1 F (36.7 C) (Oral)  Resp 20  Ht 5' 2.99" (1.6 m)  Wt 68.04 kg (150 lb)  BMI 26.58 kg/m2  SpO2 99%  LMP 09/15/2011      FHT:  FHR: 125 bpm, variability: moderate,  accelerations:  Present,  decelerations:  Absent UC:   irregular, every 5-7 minutes SVE:   Dilation: 5 Effacement (%): 70 Station: -3 Exam by:: Erline Hau RNC Pitocin infusing @18mu /min  Labs: Lab Results  Component Value Date   WBC 11.7* 07/07/2012   HGB 11.2* 07/07/2012   HCT 35.1* 07/07/2012   MCV 83.8 07/07/2012   PLT 215 07/07/2012    Assessment / Plan: IUP@[redacted]w[redacted]d  Induction of labor Protracted labor  Continue efm, continue epidural, d/c Pitocin, begin cytotec, allow one meal, anticipate SVD.  Discussed with Dr. Vickki Muff, Vikki Ports 07/07/2012, 4:28 PM

## 2012-07-07 NOTE — Anesthesia Procedure Notes (Signed)
Epidural Patient location during procedure: OB Start time: 07/07/2012 8:20 AM  Staffing Performed by: anesthesiologist   Preanesthetic Checklist Completed: patient identified, site marked, surgical consent, pre-op evaluation, timeout performed, IV checked, risks and benefits discussed and monitors and equipment checked  Epidural Patient position: sitting Prep: site prepped and draped and DuraPrep Patient monitoring: continuous pulse ox and blood pressure Approach: midline Injection technique: LOR air  Needle:  Needle type: Tuohy  Needle gauge: 17 G Needle length: 9 cm and 9 Needle insertion depth: 5 cm cm Catheter type: closed end flexible Catheter size: 19 Gauge Catheter at skin depth: 10 cm Test dose: negative  Assessment Events: blood not aspirated, injection not painful, no injection resistance, negative IV test and no paresthesia  Additional Notes Discussed risk of headache, infection, bleeding, nerve injury and failed or incomplete block.  Patient voices understanding and wishes to proceed. Reason for block:procedure for pain

## 2012-07-07 NOTE — Anesthesia Preprocedure Evaluation (Signed)
Anesthesia Evaluation  Patient identified by MRN, date of birth, ID band Patient awake    Reviewed: Allergy & Precautions, H&P , NPO status , Patient's Chart, lab work & pertinent test results, reviewed documented beta blocker date and time   History of Anesthesia Complications Negative for: history of anesthetic complications  Airway Mallampati: II TM Distance: >3 FB Neck ROM: full    Dental  (+) Teeth Intact   Pulmonary asthma (only uses inhaler when sick (last used in first trimester with flu)) , Current Smoker,  breath sounds clear to auscultation        Cardiovascular negative cardio ROS  Rhythm:regular Rate:Normal     Neuro/Psych  Headaches, negative psych ROS   GI/Hepatic negative GI ROS, (+)     substance abuse  cocaine use and marijuana use,   Endo/Other  negative endocrine ROS  Renal/GU negative Renal ROS  negative genitourinary   Musculoskeletal   Abdominal   Peds  Hematology negative hematology ROS (+)   Anesthesia Other Findings   Reproductive/Obstetrics (+) Pregnancy (No prenatal care)                           Anesthesia Physical  Anesthesia Plan  ASA: II  Anesthesia Plan: Epidural   Post-op Pain Management:    Induction:   Airway Management Planned:   Additional Equipment:   Intra-op Plan:   Post-operative Plan:   Informed Consent: I have reviewed the patients History and Physical, chart, labs and discussed the procedure including the risks, benefits and alternatives for the proposed anesthesia with the patient or authorized representative who has indicated his/her understanding and acceptance.     Plan Discussed with:   Anesthesia Plan Comments:         Anesthesia Quick Evaluation  

## 2012-07-07 NOTE — Plan of Care (Signed)
Pt is anxious, agitated.  Pt states she is bipolar- nothing noted to indicate that in prior notes.  Pt states she was diagnosed as bipolar some time ago, but has not sought continued treatment.  Pt reassured of plan of care and of baby's current status.  Encouraged pt to vent frustration in appropriate ways, and to continue to inform RN so that she can continue to meet her needs.

## 2012-07-08 ENCOUNTER — Encounter (HOSPITAL_COMMUNITY): Payer: Self-pay | Admitting: *Deleted

## 2012-07-08 MED ORDER — OXYCODONE-ACETAMINOPHEN 5-325 MG PO TABS
1.0000 | ORAL_TABLET | ORAL | Status: DC | PRN
  Administered 2012-07-08 – 2012-07-09 (×5): 1 via ORAL
  Filled 2012-07-08: qty 1
  Filled 2012-07-08: qty 2
  Filled 2012-07-08 (×3): qty 1

## 2012-07-08 MED ORDER — IBUPROFEN 600 MG PO TABS
600.0000 mg | ORAL_TABLET | Freq: Four times a day (QID) | ORAL | Status: DC
  Administered 2012-07-08 – 2012-07-10 (×8): 600 mg via ORAL
  Filled 2012-07-08 (×8): qty 1

## 2012-07-08 MED ORDER — DIPHENHYDRAMINE HCL 25 MG PO CAPS: 25.0000 mg | ORAL_CAPSULE | Freq: Four times a day (QID) | ORAL | Status: DC | PRN

## 2012-07-08 MED ORDER — TETANUS-DIPHTH-ACELL PERTUSSIS 5-2.5-18.5 LF-MCG/0.5 IM SUSP
0.5000 mL | Freq: Once | INTRAMUSCULAR | Status: AC
  Administered 2012-07-09: 0.5 mL via INTRAMUSCULAR

## 2012-07-08 MED ORDER — SODIUM CHLORIDE 0.9 % IV SOLN
2.0000 g | Freq: Four times a day (QID) | INTRAVENOUS | Status: DC
  Administered 2012-07-08: 2 g via INTRAVENOUS
  Filled 2012-07-08 (×4): qty 2000

## 2012-07-08 MED ORDER — ONDANSETRON HCL 4 MG/2ML IJ SOLN: 4.0000 mg | INTRAMUSCULAR | Status: DC | PRN

## 2012-07-08 MED ORDER — BENZOCAINE-MENTHOL 20-0.5 % EX AERO
1.0000 "application " | INHALATION_SPRAY | CUTANEOUS | Status: DC | PRN
  Filled 2012-07-08: qty 56

## 2012-07-08 MED ORDER — DIBUCAINE 1 % RE OINT
1.0000 "application " | TOPICAL_OINTMENT | RECTAL | Status: DC | PRN
  Filled 2012-07-08: qty 28

## 2012-07-08 MED ORDER — INFLUENZA VIRUS VACC SPLIT PF IM SUSP
0.5000 mL | INTRAMUSCULAR | Status: AC
  Administered 2012-07-09: 0.5 mL via INTRAMUSCULAR

## 2012-07-08 MED ORDER — TETANUS-DIPHTH-ACELL PERTUSSIS 5-2.5-18.5 LF-MCG/0.5 IM SUSP: 0.5000 mL | Freq: Once | INTRAMUSCULAR | Status: DC

## 2012-07-08 MED ORDER — WITCH HAZEL-GLYCERIN EX PADS: 1.0000 "application " | MEDICATED_PAD | CUTANEOUS | Status: DC | PRN

## 2012-07-08 MED ORDER — MEASLES, MUMPS & RUBELLA VAC ~~LOC~~ INJ
0.5000 mL | INJECTION | Freq: Once | SUBCUTANEOUS | Status: DC
  Filled 2012-07-08: qty 0.5

## 2012-07-08 MED ORDER — GENTAMICIN SULFATE 40 MG/ML IJ SOLN
160.0000 mg | Freq: Three times a day (TID) | INTRAVENOUS | Status: DC
  Filled 2012-07-08 (×3): qty 4

## 2012-07-08 MED ORDER — SIMETHICONE 80 MG PO CHEW: 80.0000 mg | CHEWABLE_TABLET | ORAL | Status: DC | PRN

## 2012-07-08 MED ORDER — LANOLIN HYDROUS EX OINT: TOPICAL_OINTMENT | CUTANEOUS | Status: DC | PRN

## 2012-07-08 MED ORDER — ONDANSETRON HCL 4 MG PO TABS: 4.0000 mg | ORAL_TABLET | ORAL | Status: DC | PRN

## 2012-07-08 MED ORDER — PRENATAL MULTIVITAMIN CH
1.0000 | ORAL_TABLET | Freq: Every day | ORAL | Status: DC
  Administered 2012-07-10: 1 via ORAL
  Filled 2012-07-08: qty 1

## 2012-07-08 MED ORDER — PNEUMOCOCCAL VAC POLYVALENT 25 MCG/0.5ML IJ INJ
0.5000 mL | INJECTION | INTRAMUSCULAR | Status: AC
  Administered 2012-07-09: 0.5 mL via INTRAMUSCULAR
  Filled 2012-07-08: qty 0.5

## 2012-07-08 MED ORDER — ZOLPIDEM TARTRATE 5 MG PO TABS
5.0000 mg | ORAL_TABLET | Freq: Every evening | ORAL | Status: DC | PRN
  Administered 2012-07-08: 5 mg via ORAL
  Filled 2012-07-08: qty 1

## 2012-07-08 MED ORDER — GENTAMICIN SULFATE 40 MG/ML IJ SOLN
170.0000 mg | Freq: Once | INTRAVENOUS | Status: AC
  Administered 2012-07-08: 170 mg via INTRAVENOUS
  Filled 2012-07-08: qty 4.25

## 2012-07-08 NOTE — Progress Notes (Signed)
Pt taken to NICU to visit baby.

## 2012-07-08 NOTE — Progress Notes (Signed)
Patricia Dunn is a 26 y.o. [phone] at [redacted]w[redacted]d   Subjective: Comfortable w/ epidural  Objective: BP 136/81  Pulse 80  Temp 101.4 F (38.6 C) (Axillary)  Resp 20  Ht 5' 2.99" (1.6 m)  Wt 68.04 kg (150 lb)  BMI 26.58 kg/m2  SpO2 99%  LMP 09/15/2011      FHT:  FHR: 145 bpm, variability: moderate,  accelerations:  Present,  decelerations:  Present early nad ? few late decels, resolved w/ position change. UC:   regular, every 2-4 minutes SVE:   Dilation: 8 Effacement (%): 90 Station: -1 Exam by:: Katrinka Blazing, CNM/Thekkekandam, MD  Labs: Lab Results  Component Value Date   WBC 11.7* 07/07/2012   HGB 11.2* 07/07/2012   HCT 35.1* 07/07/2012   MCV 83.8 07/07/2012   PLT 215 07/07/2012    Assessment / Plan: Induction of labor due to anhydramnios,  progressing well on pitocin  Labor: Active labor. Progressing on pitocin. Preeclampsia:  NA Fetal Wellbeing:  Category II Pain Control:  Epidural I/D:  maternal fever. High suspicion of chorio due to anyhydramnios. On Amp and Gent, Tylenol. Anticipated MOD:  NSVD  Haleem Hanner 07/08/2012, 6:18 AM

## 2012-07-08 NOTE — Progress Notes (Signed)
Pt agitated and hostile since beginning of shift.  Pt unable to concentrate on one thought.

## 2012-07-08 NOTE — Progress Notes (Signed)
Delivery note.  SVE at 0824 to check for station of vertex due to membranes present.  Pt pushing with uc at 0826 and unable to stop pushing.  [id number] emergency button activated.  Infant delivered at [phone].  Additional assistance arrived at 0829.  Cord clamped and cut  Immediately and taken to warmer.  Code Apgar activated immediately. NICU arrived at 0830

## 2012-07-08 NOTE — Progress Notes (Signed)
Pt continuing to push.  Head crowning. Emergency botton activated.

## 2012-07-08 NOTE — Progress Notes (Signed)
450 cc clear yellow urine

## 2012-07-08 NOTE — Progress Notes (Signed)
SVE performed to determine station of vertex

## 2012-07-08 NOTE — Consult Note (Signed)
Neonatology Note:  Attendance at Code Apgar:   Our team responded to a Code Apgar call to room # 164 following precipitous SVD, due to infant with apnea. The mother is a G7P4A2 O pos, RPR neg, Hep B neg, HIV neg, Rubella immune, GBS neg with no PNC and known history of substance abuse (cocaine, marijuana) with positive UDS on 9/2. She also is a cigarette smoker, 1 pack/day. She presented last evening with some vaginal bleeding ROM had already occurred when she was admitted, but the duration was unknown and the fluid was clear.  At delivery, there was a loose CAN times 1, and the baby coughed and gasped, but then became apneic. The OB nursing staff in attendance gave vigorous stimulation and a Code Apgar was called. Our team arrived at 2 minutes of life, at which time the baby was getting PPV, but there was no chest excursion. We bulb suctioned and got some thick mucous, then resumed PPV. There was still no chest excursion seen and the HR was about 60, so chest compressions were started. We attempted intubation with a 3.5 mm ETT at about 5 minutes, but it would not pass, even though the cords appeared open. I then intubated atraumatically at about 6 minutes of life with a 3.0 mm ETT to a depth of 9 cm at the lip. I had to use PIP of about 35 to get chest excursion and, after the baby coughed, the lungs would inflate more easily. The CO2 detector turned yellow and the tube was secured. I spoke briefly with his mother in the DR about his condition and the reason for taking him to NICU. Ap 2/1/4. The baby was taken to the NICU being hand-bagged via ETT. We noted his O2 saturation dropped into the 60s just before we got into the NICU; chest excursion again was lost and we could only get breath sounds with very high PIP. He coughed and the CO2 detector turned yellow again and we heard very coarse, junky breath sounds. We Suctioned the ETT and breath sounds improved. I spoke with the baby's aunt, who had accompanied  him to the NICU to let her know how he was doing, and I went back to room 164 and spoke with his mother to keep her updated, also.  Doretha Sou, MD

## 2012-07-08 NOTE — Progress Notes (Signed)
SVE done to determine station of vertex

## 2012-07-08 NOTE — Progress Notes (Signed)
Pt returned to room .  Proceedures in process for infant

## 2012-07-08 NOTE — Progress Notes (Signed)
Patricia Dunn is a 26 y.o. [id number] at [redacted]w[redacted]d.   Subjective: Comfortable w/ epidural  Objective: BP 139/78  Pulse 63  Temp 98.4 F (36.9 C) (Oral)  Resp 18  Ht 5' 2.99" (1.6 m)  Wt 68.04 kg (150 lb)  BMI 26.58 kg/m2  SpO2 99%  LMP 09/15/2011      FHT:  FHR: 130 bpm, variability: moderate,  accelerations:  Present,  decelerations:  Absent UC:   irregular, every 2-5 minutes, moderate on 8 milliUnits/min pitocin SVE:   Dilation: 6 Effacement (%): 60 Station: -2 Exam by:: Katrinka Blazing, CNM  Labs: Lab Results  Component Value Date   WBC 11.7* 07/07/2012   HGB 11.2* 07/07/2012   HCT 35.1* 07/07/2012   MCV 83.8 07/07/2012   PLT 215 07/07/2012    Assessment / Plan: Induction of labor due to anhydramnios  Labor: Early labor progressing slowly on pitocin. Preeclampsia:  NA Fetal Wellbeing:  Category I Pain Control:  Epidural I/D:  n/a Anticipated MOD:  NSVD  Patricia Dunn 07/08/2012, 12:44 AM

## 2012-07-08 NOTE — Progress Notes (Signed)
DERISHA OLIVERI is a 26 y.o. [id number] at [redacted]w[redacted]d admitted for induction of labor due to anhydramnios.  Subjective: Pt feeling increasing pain and pressure in lower abdomen.  Objective: BP 145/84  Pulse 75  Temp 98 F (36.7 C) (Oral)  Resp 18  Ht 5' 2.99" (1.6 m)  Wt 68.04 kg (150 lb)  BMI 26.58 kg/m2  SpO2 99%  LMP 09/15/2011      FHT:  FHR: 145 bpm, variability: moderate,  accelerations:  Present,  decelerations:  Present variables, occasional on pitocin 14 milliunits UC:   regular, every 3 minutes SVE:   Dilation: 7.5 Effacement (%): 60 Station: -2 Exam by:: Yonis Carreon  Labs: Lab Results  Component Value Date   WBC 11.7* 07/07/2012   HGB 11.2* 07/07/2012   HCT 35.1* 07/07/2012   MCV 83.8 07/07/2012   PLT 215 07/07/2012    Assessment / Plan: 26 y.o. [id number] at [redacted]w[redacted]d admitted for induction of labor due to anhydramnios.  Labor: Active labor progressing slowly on pitocin.  Preeclampsia: NA  Fetal Wellbeing: Category II  Pain Control: Epidural  I/D: n/a  Anticipated MOD: NSVD  Simone Curia 07/08/2012, 3:40 AM

## 2012-07-08 NOTE — Progress Notes (Signed)
ANTIBIOTIC CONSULT NOTE - INITIAL  Pharmacy Consult for  gentamcin Indication: maternal fever , R/O chorioamnionitis  No Known Allergies  Patient Measurements: Height: 5' 2.99" (160 cm) Weight: 150 lb (68.04 kg) IBW/kg (Calculated) : 52.38  Adjusted Body Weight: 58 kg  Vital Signs: Temp: 101.4 F (38.6 C) (09/23 0501) Temp src: Axillary (09/23 0501) BP: 128/60 mmHg (09/23 0501) Pulse Rate: 82  (09/23 0501) Intake/Output from previous day:   Intake/Output from this shift:    Labs:  Basename 07/07/12 0010  WBC 11.7*  HGB 11.2*  PLT 215  LABCREA --  CREATININE --   Estimated Creatinine Clearance: 100.3 ml/min (by C-G formula based on Cr of 0.51).    Microbiology: Recent Results (from the past 720 hour(s))  OB RESULTS CONSOLE GBS     Status: Normal      Component Value Range Status Comment   GBS Negative      WET PREP, GENITAL     Status: Abnormal   Collection Time   07/06/12 10:00 PM      Component Value Range Status Comment   Yeast Wet Prep HPF POC NONE SEEN  NONE SEEN Final    Trich, Wet Prep NONE SEEN  NONE SEEN Final    Clue Cells Wet Prep HPF POC FEW (*) NONE SEEN Final    WBC, Wet Prep HPF POC FEW (*) NONE SEEN Final MODERATE BACTERIA SEEN  GROUP B STREP BY PCR     Status: Abnormal   Collection Time   07/07/12 12:44 AM      Component Value Range Status Comment   Group B strep by PCR INVALID RESULTS, SPECIMEN SENT FOR CULTURE (*) NEGATIVE Final   GROUP B STREP BY PCR     Status: Normal   Collection Time   07/07/12  2:50 AM      Component Value Range Status Comment   Group B strep by PCR NEGATIVE  NEGATIVE Final     Medical History: Past Medical History  Diagnosis Date  . Asthma   . Headache     migraines    Medications:   Ampicillin 2gm IV q6h  Assessment:     Coverage for maternal fever of probable pelvic origin  Goal of Therapy:  Desire peak serum level 6-67mcg/ml and trough less than 1 mcg/ml  Plan:  1.  Loading dose= Gentamcin 170mg  IV  x 1, then 2. Maintenance regimen 160mg  IV q8h (if continued) 3.  Get Serum creatinine to confirm est CrCl 4.  Will measure actual serum gentamicin levels if tx continued >48-72hr or as clinically indicated  Scarlett Presto 07/08/2012,5:26 AM

## 2012-07-08 NOTE — Progress Notes (Signed)
07/08/12 1600  Clinical Encounter Type  Visited With Patient and family together (sister Patricia ([name]? spelling?); dtr Patricia (spelling?), 21 mo)  Visit Type Spiritual support;Social support (Consult for support)  Referral From Nurse [name], RN)  Spiritual Encounters  Spiritual Needs Emotional;Prayer  Stress Factors  Patient Stress Factors Loss of control;Lack of knowledge (Baby critical; pt anxious)    Visited with Patricia Dunn, Patricia Dunn, and Patricia sister (who was caring for child).  Patricia Dunn was anxious about baby's critical condition, maybe not taking in all of the details about baby's situation (per staff), but understanding enough that she doesn't feel emotionally ready to name him until she sees how he progresses (per pt).  Provided opportunity for Patricia Dunn to share and process Patricia story.  Provided logistical support for adults and crayons/coloring sheets for child.  Offered prayer at bedside per request.  Patricia Dunn was pleased, asking me to return tomorrow; stated that I would send my colleague Patricia Dunn in my stead and plan follow up personally on Wednesday.  [address] [place], [place] [phone]

## 2012-07-08 NOTE — Anesthesia Postprocedure Evaluation (Signed)
  Anesthesia Post Note  Patient: Patricia Dunn  Procedure(s) Performed: * No procedures listed *  Anesthesia type: Epidural  Patient location: Mother/Baby  Post pain: Pain level controlled  Post assessment: Post-op Vital signs reviewed  Last Vitals:  Filed Vitals:   07/08/12 1630  BP: 155/90  Pulse: 60  Temp: 36.3 C  Resp: 22    Post vital signs: Reviewed  Level of consciousness:alert  Complications: No apparent anesthesia complications

## 2012-07-08 NOTE — Progress Notes (Signed)
To NICU pe w/c

## 2012-07-08 NOTE — Progress Notes (Signed)
Pt placed on bedpan

## 2012-07-08 NOTE — Progress Notes (Signed)
Infant delivered.  Tactile stimulation.  Some resp effort noted.  Awaiting other medical personal to assist

## 2012-07-08 NOTE — Progress Notes (Signed)
Pt wanting foley out and delivery doctors in room.  Explained to pt doctor would be called

## 2012-07-09 ENCOUNTER — Encounter (HOSPITAL_COMMUNITY): Payer: Self-pay | Admitting: Anesthesiology

## 2012-07-09 ENCOUNTER — Inpatient Hospital Stay (HOSPITAL_COMMUNITY): Payer: Medicaid Other | Admitting: Anesthesiology

## 2012-07-09 ENCOUNTER — Encounter (HOSPITAL_COMMUNITY)
Admission: AD | Disposition: A | Discharge status: Home / Self Care | Payer: Self-pay | Source: Ambulatory Visit | Attending: Obstetrics & Gynecology

## 2012-07-09 DIAGNOSIS — Z302 Encounter for sterilization: Secondary | ICD-10-CM

## 2012-07-09 HISTORY — PX: TUBAL LIGATION: SHX77

## 2012-07-09 LAB — COMPREHENSIVE METABOLIC PANEL
BUN: 5 mg/dL — ABNORMAL LOW (ref 6–23)
Calcium: 8.9 mg/dL (ref 8.4–10.5)
Creatinine, Ser: 0.56 mg/dL (ref 0.50–1.10)
GFR calc Af Amer: >90 mL/min (ref >90)
Glucose, Bld: 83 mg/dL (ref 70–99)
Sodium: 138 mEq/L (ref 135–145)
Total Protein: 5.4 g/dL — ABNORMAL LOW (ref 6.0–8.3)

## 2012-07-09 LAB — PROTEIN / CREATININE RATIO, URINE
Creatinine, Urine: 80.87 mg/dL
Protein Creatinine Ratio: 0.2 — ABNORMAL HIGH (ref 0.00–0.15)
Total Protein, Urine: 16.1 mg/dL

## 2012-07-09 LAB — CBC
HCT: 32.2 % — ABNORMAL LOW (ref 36.0–46.0)
Hemoglobin: 10.2 g/dL — ABNORMAL LOW (ref 12.0–15.0)
MCH: 26.6 pg (ref 26.0–34.0)
MCHC: 31.7 g/dL (ref 30.0–36.0)

## 2012-07-09 LAB — MRSA PCR SCREENING: MRSA by PCR: NEGATIVE

## 2012-07-09 SURGERY — LIGATION, FALLOPIAN TUBE, POSTPARTUM
Anesthesia: Epidural | Site: Abdomen | Laterality: Bilateral | Wound class: Clean Contaminated

## 2012-07-09 MED ORDER — MIDAZOLAM HCL 5 MG/5ML IJ SOLN
INTRAMUSCULAR | Status: DC | PRN
  Administered 2012-07-09: 2 mg via INTRAVENOUS

## 2012-07-09 MED ORDER — FENTANYL CITRATE 0.05 MG/ML IJ SOLN: 25.0000 ug | INTRAMUSCULAR | Status: DC | PRN

## 2012-07-09 MED ORDER — OXYCODONE-ACETAMINOPHEN 5-325 MG PO TABS
1.0000 | ORAL_TABLET | ORAL | Status: DC | PRN
  Administered 2012-07-09 – 2012-07-10 (×3): 2 via ORAL
  Filled 2012-07-09 (×3): qty 2

## 2012-07-09 MED ORDER — FENTANYL CITRATE 0.05 MG/ML IJ SOLN
INTRAMUSCULAR | Status: DC | PRN
  Administered 2012-07-09 (×2): 50 ug via INTRAVENOUS
  Administered 2012-07-09: 100 ug via INTRAVENOUS

## 2012-07-09 MED ORDER — HYDRALAZINE HCL 20 MG/ML IJ SOLN
INTRAMUSCULAR | Status: AC
  Filled 2012-07-09: qty 1

## 2012-07-09 MED ORDER — LIDOCAINE-EPINEPHRINE (PF) 2 %-1:200000 IJ SOLN
INTRAMUSCULAR | Status: AC
  Filled 2012-07-09: qty 20

## 2012-07-09 MED ORDER — FENTANYL CITRATE 0.05 MG/ML IJ SOLN
INTRAMUSCULAR | Status: AC
  Filled 2012-07-09: qty 2

## 2012-07-09 MED ORDER — MIDAZOLAM HCL 2 MG/2ML IJ SOLN
INTRAMUSCULAR | Status: AC
  Filled 2012-07-09: qty 2

## 2012-07-09 MED ORDER — LACTATED RINGERS IV SOLN
INTRAVENOUS | Status: DC | PRN
  Administered 2012-07-09: 11:00:00 via INTRAVENOUS

## 2012-07-09 MED ORDER — OXYCODONE-ACETAMINOPHEN 5-325 MG PO TABS
2.0000 | ORAL_TABLET | Freq: Once | ORAL | Status: AC
  Administered 2012-07-09: 2 via ORAL

## 2012-07-09 MED ORDER — BUPIVACAINE HCL (PF) 0.25 % IJ SOLN
INTRAMUSCULAR | Status: AC
  Filled 2012-07-09: qty 30

## 2012-07-09 MED ORDER — ALBUTEROL SULFATE HFA 108 (90 BASE) MCG/ACT IN AERS: 2.0000 | INHALATION_SPRAY | Freq: Four times a day (QID) | RESPIRATORY_TRACT | Status: DC | PRN

## 2012-07-09 MED ORDER — ZOLPIDEM TARTRATE 5 MG PO TABS
5.0000 mg | ORAL_TABLET | Freq: Every evening | ORAL | Status: DC | PRN
  Administered 2012-07-09: 5 mg via ORAL
  Filled 2012-07-09: qty 1

## 2012-07-09 MED ORDER — BUPIVACAINE HCL (PF) 0.25 % IJ SOLN
INTRAMUSCULAR | Status: DC | PRN
  Administered 2012-07-09: 15 mL

## 2012-07-09 MED ORDER — ONDANSETRON HCL 4 MG/2ML IJ SOLN
INTRAMUSCULAR | Status: DC | PRN
  Administered 2012-07-09: 4 mg via INTRAVENOUS

## 2012-07-09 MED ORDER — SODIUM BICARBONATE 8.4 % IV SOLN
INTRAVENOUS | Status: DC | PRN
  Administered 2012-07-09 (×2): 5 mL via EPIDURAL

## 2012-07-09 SURGICAL SUPPLY — 18 items
CHLORAPREP W/TINT 26ML (MISCELLANEOUS) ×2 IMPLANT
CONTAINER PREFILL 10% NBF 15ML (MISCELLANEOUS) ×4 IMPLANT
DRSG COVADERM PLUS 2X2 (GAUZE/BANDAGES/DRESSINGS) ×1 IMPLANT
GLOVE BIOGEL PI IND STRL 6.5 (GLOVE) ×2 IMPLANT
GLOVE BIOGEL PI INDICATOR 6.5 (GLOVE) ×2
GLOVE SURG SS PI 6.0 STRL IVOR (GLOVE) ×2 IMPLANT
GOWN PREVENTION PLUS LG XLONG (DISPOSABLE) ×4 IMPLANT
NDL HYPO 25X1 1.5 SAFETY (NEEDLE) IMPLANT
NEEDLE HYPO 25X1 1.5 SAFETY (NEEDLE) ×2 IMPLANT
PACK ABDOMINAL MINOR (CUSTOM PROCEDURE TRAY) ×2 IMPLANT
SUT PLAIN 0 NONE (SUTURE) ×2 IMPLANT
SUT VIC AB 0 CT1 27 (SUTURE) ×2
SUT VIC AB 0 CT1 27XBRD ANBCTR (SUTURE) ×1 IMPLANT
SUT VIC AB 3-0 PS2 18 (SUTURE) ×2 IMPLANT
SYR CONTROL 10ML LL (SYRINGE) ×1 IMPLANT
TOWEL OR 17X24 6PK STRL BLUE (TOWEL DISPOSABLE) ×4 IMPLANT
TRAY FOLEY CATH 14FR (SET/KITS/TRAYS/PACK) ×2 IMPLANT
WATER STERILE IRR 1000ML POUR (IV SOLUTION) ×2 IMPLANT

## 2012-07-09 NOTE — Progress Notes (Signed)
Post Partum Day 1 Subjective: up ad lib, voiding, tolerating PO and no headache, vision changes or RUQ pain  Objective: Blood pressure 163/90, pulse 77, temperature 98.7 F (37.1 C), temperature source Oral, resp. rate 20, height 5' 2.99" (1.6 m), weight 68.04 kg (150 lb), last menstrual period 09/15/2011, SpO2 100.00%, unknown if currently breastfeeding.  Physical Exam:  General: cooperative, no distress and sleepy Lochia: appropriate Uterine Fundus: firm Incision: n/a DVT Evaluation: No evidence of DVT seen on physical exam. Filed Vitals:   07/08/12 1227 07/08/12 1630 07/08/12 2145 07/09/12 0525  BP: 142/88 155/90 144/84 163/90  Pulse: 80 60 75 77  Temp: 97.3 F (36.3 C) 97.4 F (36.3 C) 98 F (36.7 C) 98.7 F (37.1 C)  TempSrc: Oral Oral Oral Oral  Resp: 20 22 20 20   Height:      Weight:      SpO2: 100% 100% 99% 100%   BP 173/79 at 11:30 AM on 9/23. Recheck BP today after 163/90:  124/70, 108/53   Basename 07/09/12 0515 07/07/12 0010  HGB 10.2* 11.2*  HCT 32.2* 35.1*    Assessment/Plan: 26 y.o. [id number] s/p IOL for anhydramnios, SVD, PPD # 1, no prenatal care. Doing well postpartum. Continue routine care. Elevated blood pressures, most in mild range but a few over 160. Check protein/creatining ratio, CMP. Planning BTL today. Bottle feeding History of cocaine use, UDS on admission negative. Baby in NICU   LOS: 3 days   Napoleon Form 07/09/2012, 6:38 AM

## 2012-07-09 NOTE — Progress Notes (Signed)
26 y.o. yo [phone]  with undesired fertility,status post vaginal delivery, desires permanent sterilization. Risks and benefits of procedure discussed with patient including permanence of method, bleeding, infection, injury to surrounding organs and need for additional procedures. Risk failure of 0.5-1% with increased risk of ectopic gestation if pregnancy occurs was also discussed with patient.

## 2012-07-09 NOTE — Progress Notes (Signed)
UR Chart review completed.  

## 2012-07-09 NOTE — Addendum Note (Signed)
Addendum  created 07/09/12 1902 by Renford Dills, CRNA   Modules edited:Notes Section

## 2012-07-09 NOTE — Anesthesia Preprocedure Evaluation (Signed)
Anesthesia Evaluation  Patient identified by MRN, date of birth, ID band Patient awake    Reviewed: Allergy & Precautions, H&P , NPO status , Patient's Chart, lab work & pertinent test results, reviewed documented beta blocker date and time   History of Anesthesia Complications Negative for: history of anesthetic complications  Airway Mallampati: II TM Distance: >3 FB Neck ROM: full    Dental  (+) Teeth Intact   Pulmonary asthma (only uses inhaler when sick (last used in first trimester with flu)) , Current Smoker,  breath sounds clear to auscultation        Cardiovascular negative cardio ROS  Rhythm:regular Rate:Normal     Neuro/Psych  Headaches, negative psych ROS   GI/Hepatic negative GI ROS, (+)     substance abuse  cocaine use and marijuana use,   Endo/Other  negative endocrine ROS  Renal/GU negative Renal ROS  negative genitourinary   Musculoskeletal   Abdominal   Peds  Hematology negative hematology ROS (+)   Anesthesia Other Findings   Reproductive/Obstetrics (+) Pregnancy (No prenatal care)                           Anesthesia Physical  Anesthesia Plan  ASA: II  Anesthesia Plan: Epidural   Post-op Pain Management:    Induction:   Airway Management Planned:   Additional Equipment:   Intra-op Plan:   Post-operative Plan:   Informed Consent: I have reviewed the patients History and Physical, chart, labs and discussed the procedure including the risks, benefits and alternatives for the proposed anesthesia with the patient or authorized representative who has indicated his/her understanding and acceptance.     Plan Discussed with:   Anesthesia Plan Comments:         Anesthesia Quick Evaluation

## 2012-07-09 NOTE — Anesthesia Postprocedure Evaluation (Signed)
  Anesthesia Post-op Note  Patient: Patricia Dunn  Procedure(s) Performed: Procedure(s) (LRB) with comments: POST PARTUM TUBAL LIGATION (Bilateral)  Patient Location: [facility]  Anesthesia Type: Epidural  Level of Consciousness: awake  Airway and Oxygen Therapy: Patient Spontanous Breathing  Post-op Pain: mild  Post-op Assessment: Patient's Cardiovascular Status Stable and Respiratory Function Stable  Post-op Vital Signs: stable  Complications: No apparent anesthesia complications

## 2012-07-09 NOTE — Anesthesia Postprocedure Evaluation (Signed)
  Anesthesia Post-op Note  Patient: Patricia Dunn  Procedure(s) Performed: Procedure(s) (LRB) with comments: POST PARTUM TUBAL LIGATION (Bilateral)  Patient is awake, responsive, moving her legs, and has signs of resolution of her numbness. Pain and nausea are reasonably well controlled. Vital signs are stable and clinically acceptable. Oxygen saturation is clinically acceptable. There are no apparent anesthetic complications at this time. Patient is ready for discharge.

## 2012-07-09 NOTE — Progress Notes (Signed)
SW attempted to meet with MOB to complete assessment.  FOB was in the room and MOB appeared to be in pain.  SW asked if MOB wanted to talk at a later time and she said that SW could stay.  She states she is in pain from BTL and that she has been given pain medication, but it hasn't started working yet.  SW asked if we could discuss anything with FOB present and MOB said yes.  He was in and out of the bathroom and not engaged in the conversation.  SW asked how MOB is doing and acknowledged how stressful the baby's birth and condition must be.  She agreed, but with little response.  SW inquired about her drug use and informed her that she was positive for Cocaine on 06/17/12.  She looked confused and told SW that she has never used Cocaine.  She admits to Marijuana use and states that maybe she "had a bad blunt."  She reports regular Marijuana use and states that she told her doctor that she smokes pot.  She does not seem to understand why this in itself would be concerning.  She continued to deny Cocaine use.  SW asked MOB if she has ever had involvement with Child Protective Services.  She admits to having a case that was closed recently and states that Jerry Ufot was the worker.  She reports that the case was opened by someone calling on her just to get her in trouble.  SW then informed MOB that the staff have noted concerns regarding the way she was handling her toddler yesterday.  SW informed her of the report SW received that MOB was yelling at her to shut up, raising her hand, and slinging her up on the bed by her arm.  She denies this as well and says that her sister was present and maybe it was her.  SW states that SW was not the one witnessing the behavior, but was sure that there was no mistake that it was MOB's interaction with toddler that was concerning.  She then said, "I have four children and no one is going to tell me how I can treat them."  She told SW that SW will have to come back later.  SW agreed  and asked if there was a time she would prefer.  She declined.  SW made report to [place] [employer] and will attempt to complete assessment at a later time if MOB is willing.   

## 2012-07-09 NOTE — Op Note (Signed)
PREOPERATIVE DIAGNOSIS:  Undesired fertility  POSTOPERATIVE DIAGNOSIS:  Undesired fertility  PROCEDURE:  Postpartum Bilateral Tubal Sterilization using Pomeroy method   ANESTHESIA:  Epidural  COMPLICATIONS:  None immediate.  ESTIMATED BLOOD LOSS:  Less than 20cc.  FLUIDS: 800 cc LR.  URINE OUTPUT:  550 cc of clear urine.  INDICATIONS: 26 y.o. yo [id number]  with undesired fertility,status post vaginal delivery, desires permanent sterilization. Risks and benefits of procedure discussed with patient including permanence of method, bleeding, infection, injury to surrounding organs and need for additional procedures. Risk failure of 0.5-1% with increased risk of ectopic gestation if pregnancy occurs was also discussed with patient.   FINDINGS:  Normal uterus, tubes, and ovaries.  TECHNIQUE: After informed consent was obtained, the patient was taken to the operating room where anesthesia was induced and found to be adequate. A small transverse, infraumbilical skin incision was made with the scalpel. This incision was carried down to the underlying layer of fascia. The fascia was grasped with Kocher clamps tented up and entered sharply with Mayo scissors. Underlying peritoneum was then identified tented up and entered sharply with Metzenbaum scissors. The fascia was tagged with 0 Vicryl. The patient's left fallopian tube was then identified, brought to the incision, and grasped with a Babcock clamp. The tube was then followed out to the fimbria. The Babcock clamp was then used to grasp the tube approximately 4 cm from the cornual region. A 3 cm segment of the tube was then ligated with free tie of plain gut suture, transected and excised. Good hemostasis was noted and the tube was returned to the abdomen. The right fallopian tube was then identified to its fimbriated end, ligated, and a 3 cm segment excised in a similar fashion. Excellent hemostasis was noted, and the tube returned to the abdomen. The  fascia was re-approximated with 0 Vicryl. The skin was closed in a subcuticular fashion with 3-0 Vicryl. Quarter percent Marcaine solution was then injected at the incision site. The patient tolerated the procedure well. Sponge, lap, and needle count were correct x2. The patient was taken to recovery room in stable condition.

## 2012-07-09 NOTE — Transfer of Care (Signed)
Immediate Anesthesia Transfer of Care Note  Patient: Patricia Dunn  Procedure(s) Performed: Procedure(s) (LRB) with comments: POST PARTUM TUBAL LIGATION (Bilateral)  Patient Location: PACU  Anesthesia Type: Epidural  Level of Consciousness: awake  Airway & Oxygen Therapy: Patient Spontanous Breathing  Post-op Assessment: Report given to PACU RN and Post -op Vital signs reviewed and stable  Post vital signs: stable  Complications: No apparent anesthesia complications

## 2012-07-10 ENCOUNTER — Encounter (HOSPITAL_COMMUNITY): Payer: Self-pay | Admitting: Obstetrics and Gynecology

## 2012-07-10 MED ORDER — BISACODYL 5 MG PO TBEC
10.0000 mg | DELAYED_RELEASE_TABLET | Freq: Every day | ORAL | Status: DC | PRN
  Administered 2012-07-10: 10 mg via ORAL
  Filled 2012-07-10: qty 2

## 2012-07-10 MED ORDER — BISACODYL 5 MG PO TBEC: 5.0000 mg | DELAYED_RELEASE_TABLET | Freq: Every day | ORAL | Status: DC | PRN

## 2012-07-10 MED ORDER — HYDROCODONE-ACETAMINOPHEN 5-325 MG PO TABS
1.0000 | ORAL_TABLET | Freq: Once | ORAL | Status: AC
  Administered 2012-07-10: 1 via ORAL
  Filled 2012-07-10: qty 1

## 2012-07-10 MED ORDER — OXYCODONE-ACETAMINOPHEN 5-325 MG PO TABS: 1.0000 | ORAL_TABLET | Freq: Four times a day (QID) | ORAL | Status: DC | PRN

## 2012-07-10 MED ORDER — IBUPROFEN 600 MG PO TABS
600.0000 mg | ORAL_TABLET | Freq: Four times a day (QID) | ORAL | Status: DC | PRN
Start: 2012-07-10 — End: 2012-10-08

## 2012-07-10 MED ORDER — HYDROCODONE-ACETAMINOPHEN 5-500 MG PO TABS: 1.0000 | ORAL_TABLET | Freq: Four times a day (QID) | ORAL | Status: DC | PRN

## 2012-07-10 NOTE — Progress Notes (Signed)
SW received call from Child Protective Service Trudee Grip [phone] who has been assigned to this case.  SW informed her that MOB will be discharged today and gave her MOB's room phone number since Ms. Phoenix states the phone number they have on file is not in service.  SW explained to concerns to Ms. Phoenix and asked to please be kept up to date on the CPS plan.  Worker agreed.

## 2012-07-10 NOTE — Progress Notes (Signed)
Per [facility] Tanya Stalling, pt was seen and spoken to by [employer].  [employer] gave pt information that her concerns have been forwarded to the appropriate management and pt should be contacted.

## 2012-07-10 NOTE — Progress Notes (Signed)
Pt states she has concerns about her delivery and why doctors were not available when she requested them and why nurse delivered her baby.  Concerns were shared with [facility], Tobie Poet, who will come speak with pt soon.  Pt was given contact information for director of Women's unit and Software engineer and said stated she will contact them tomorrow.

## 2012-07-10 NOTE — Discharge Summary (Signed)
Obstetric Discharge Summary Reason for Admission: onset of labor Prenatal Procedures: ultrasound Intrapartum Procedures: spontaneous vaginal delivery and precipitous preterm birth Postpartum Procedures: P.P. tubal ligation and hemorrhoid Complications-Operative and Postpartum: none Hemoglobin  Date Value Range Status  07/09/2012 10.2* 12.0 - 15.0 g/dL Final     HCT  Date Value Range Status  07/09/2012 32.2* 36.0 - 46.0 % Final    Physical Exam:  General: alert, appears stated age and no distress Lochia: appropriate Uterine Fundus: firm, large diastasis, slightly tender periumbilical area Incision: healing well, no significant drainage, no significant erythema DVT Evaluation: No evidence of DVT seen on physical exam. Rectal: 1 cm simple external hemorrhoid   Discharge Diagnoses: Term Pregnancy-delivered, BTL, hemorrhoid  Discharge Information: Date: 07/10/2012 Activity: per booklet Diet: routine Medications: PNV, Ibuprofen, Colace and Percocet Condition: stable Instructions: refer to practice specific booklet Discharge to: home   Newborn Data: Live born female  Birth Weight: 6 lb 12.1 oz (3065 g) APGAR: 2, 1   NICU.  Patricia Dunn 07/10/2012, 9:46 AM

## 2012-10-08 ENCOUNTER — Encounter (HOSPITAL_COMMUNITY): Payer: Self-pay | Admitting: *Deleted

## 2012-10-08 ENCOUNTER — Emergency Department (HOSPITAL_COMMUNITY)
Admission: EM | Admit: 2012-10-08 | Discharge: 2012-10-08 | Disposition: A | Discharge status: Home / Self Care | Payer: Self-pay | Attending: Emergency Medicine | Admitting: Emergency Medicine

## 2012-10-08 DIAGNOSIS — IMO0001 Reserved for inherently not codable concepts without codable children: Secondary | ICD-10-CM | POA: Insufficient documentation

## 2012-10-08 DIAGNOSIS — H05019 Cellulitis of unspecified orbit: Secondary | ICD-10-CM | POA: Insufficient documentation

## 2012-10-08 DIAGNOSIS — Z79899 Other long term (current) drug therapy: Secondary | ICD-10-CM | POA: Insufficient documentation

## 2012-10-08 DIAGNOSIS — J45909 Unspecified asthma, uncomplicated: Secondary | ICD-10-CM | POA: Insufficient documentation

## 2012-10-08 DIAGNOSIS — Y9229 Other specified public building as the place of occurrence of the external cause: Secondary | ICD-10-CM | POA: Insufficient documentation

## 2012-10-08 DIAGNOSIS — W57XXXA Bitten or stung by nonvenomous insect and other nonvenomous arthropods, initial encounter: Secondary | ICD-10-CM

## 2012-10-08 DIAGNOSIS — F17200 Nicotine dependence, unspecified, uncomplicated: Secondary | ICD-10-CM | POA: Insufficient documentation

## 2012-10-08 DIAGNOSIS — Z8679 Personal history of other diseases of the circulatory system: Secondary | ICD-10-CM | POA: Insufficient documentation

## 2012-10-08 DIAGNOSIS — Y9389 Activity, other specified: Secondary | ICD-10-CM | POA: Insufficient documentation

## 2012-10-08 DIAGNOSIS — L03213 Periorbital cellulitis: Secondary | ICD-10-CM

## 2012-10-08 MED ORDER — CEPHALEXIN 500 MG PO CAPS: 500.0000 mg | ORAL_CAPSULE | Freq: Four times a day (QID) | ORAL | Status: DC

## 2012-10-08 MED ORDER — HYDROXYZINE HCL 25 MG PO TABS: 25.0000 mg | ORAL_TABLET | Freq: Four times a day (QID) | ORAL | Status: DC

## 2012-10-08 NOTE — ED Provider Notes (Signed)
History     CSN: [id number]  Arrival date & time 10/08/12  1622   First MD Initiated Contact with Patient 10/08/12 1841      Chief Complaint  Patient presents with  . swollen eye and bug bites     (Consider location/radiation/quality/duration/timing/severity/associated sxs/prior treatment) HPI Comments: Patient with multiple bites to arms, legs, and right eye.  She stayed in a hotel and believes these to be bedbugs.  No fevers or chills.  No change in vision.  The history is provided by the patient.    Past Medical History  Diagnosis Date  . Asthma   . Headache     migraines    Past Surgical History  Procedure Date  . Vaginal delivery     X 4  . No past surgeries   . Tubal ligation 07/09/2012    Procedure: POST PARTUM TUBAL LIGATION;  Surgeon: Catalina Antigua, MD;  Location: [facility];  Service: Gynecology;  Laterality: Bilateral;    Family History  Problem Relation Age of Onset  . Other Neg Hx     History  Substance Use Topics  . Smoking status: Current Every Day Smoker -- 1.0 packs/day    Types: Cigarettes  . Smokeless tobacco: Never Used  . Alcohol Use: Yes     Comment: Beginning of pregnancy    OB History    Grav Para Term Preterm Abortions TAB SAB Ect Mult Living   7 5 5  0 2 0 2 0 0 5      Review of Systems  All other systems reviewed and are negative.    Allergies  Review of patient's allergies indicates no known allergies.  Home Medications   Current Outpatient Rx  Name  Route  Sig  Dispense  Refill  . ALBUTEROL SULFATE HFA 108 (90 BASE) MCG/ACT IN AERS   Inhalation   Inhale 2 puffs into the lungs every 6 (six) hours as needed. For shortness of breath           BP 115/110  Pulse 62  Temp 97.8 F (36.6 C) (Oral)  SpO2 100%  Breastfeeding? Unknown  Physical Exam  Nursing note and vitals reviewed. Constitutional: She is oriented to person, place, and time. She appears well-developed and well-nourished.  HENT:  Head: Normocephalic  and atraumatic.  Eyes: EOM are normal. Pupils are equal, round, and reactive to light.       There is marked swelling above the right eye.  The cornea and iris appear clear.  There is no pain with eye movements.    Neck: Normal range of motion. Neck supple.  Musculoskeletal: Normal range of motion.  Neurological: She is alert and oriented to person, place, and time.  Skin: Skin is warm and dry.    ED Course  Procedures (including critical care time)  Labs Reviewed - No data to display No results found.   No diagnosis found.    MDM  Likely bed bug bites.  As there is swelling of the right eyelid, will treat as possible periorbital cellulitis.         Geoffery Lyons, MD 10/08/12 [phone]

## 2012-10-08 NOTE — ED Notes (Signed)
Pt stayed at a hotel last nite and woke up with right eye swollen and bites to arms and right leg

## 2013-02-14 IMAGING — US US OB LIMITED
1 series · 14 of 27 positions shown · non-contrast
Comparison: none

CLINICAL DATA: Vaginal bleeding and decelerations.

[Series 1: us fetal bpp w/o nonstress · non-contrast · 27 acquisitions, 14 frames shown]
[im 1/27]
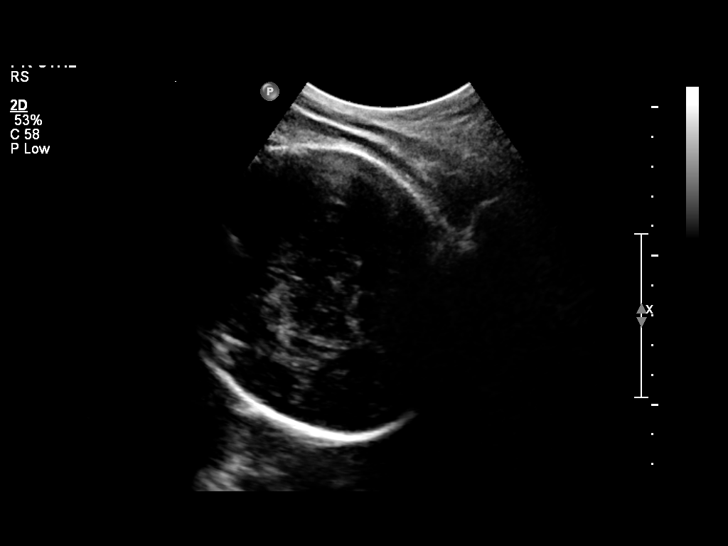
[im 3/27]
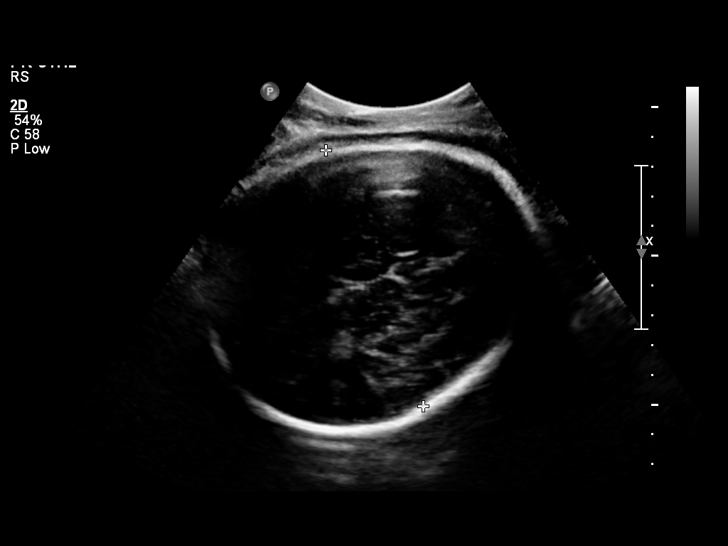
[im 5/27]
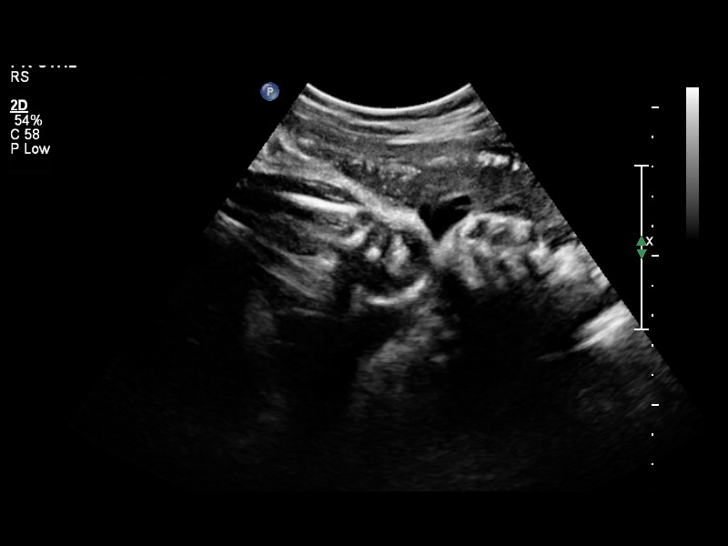
[im 7/27]
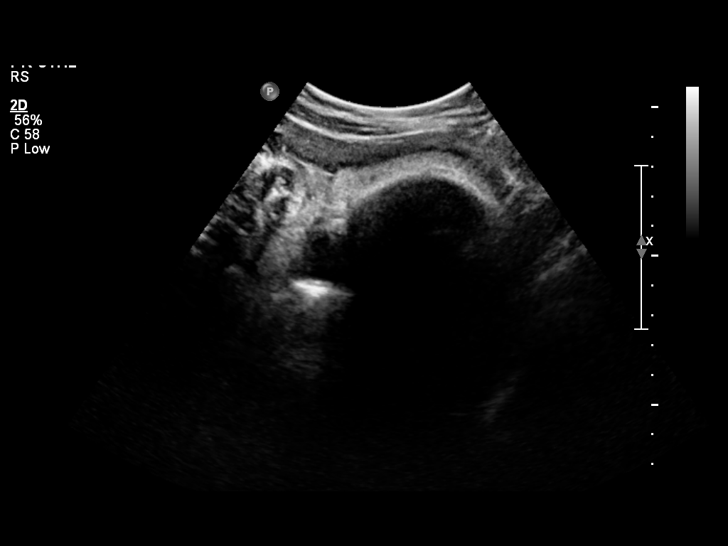
[im 9/27]
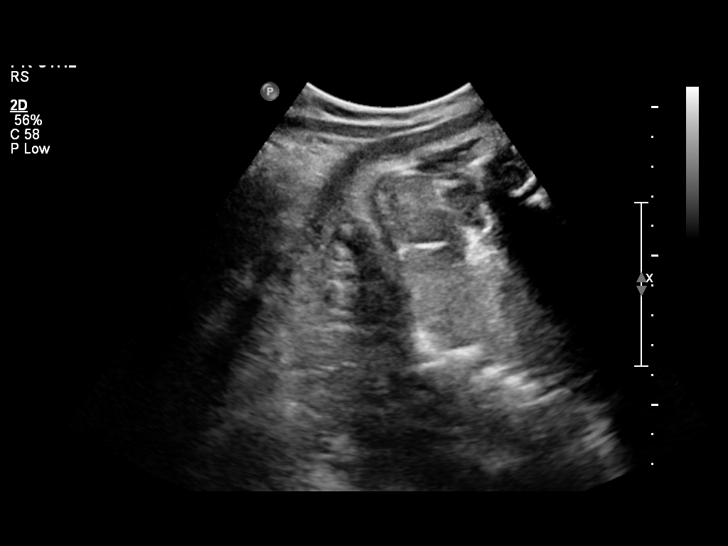
[im 11/27]
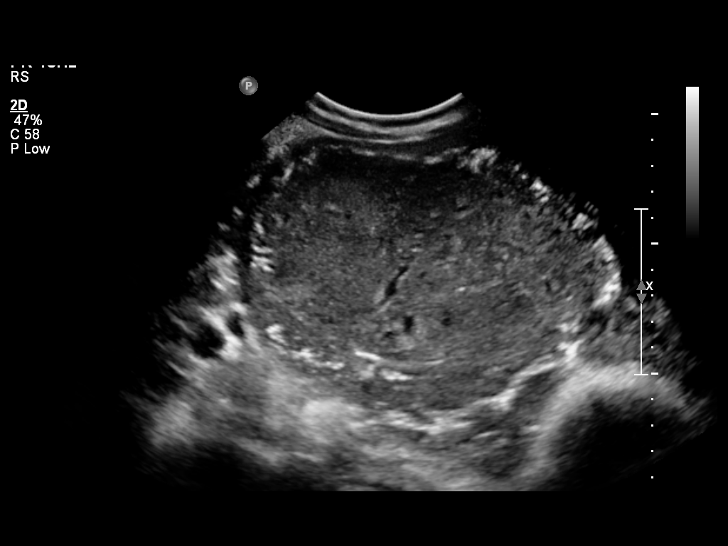
[im 13/27]
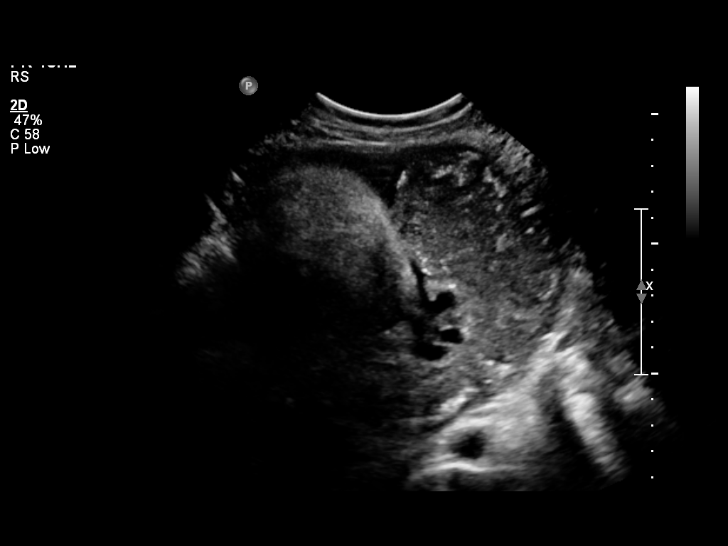
[im 15/27]
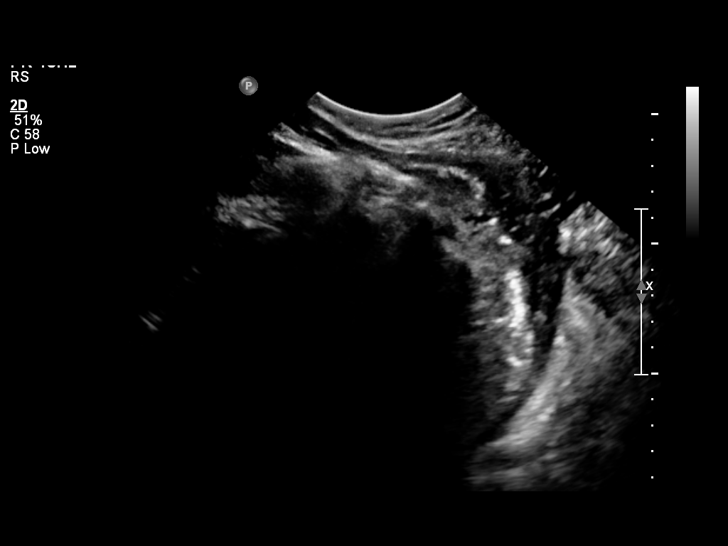
[im 17/27]
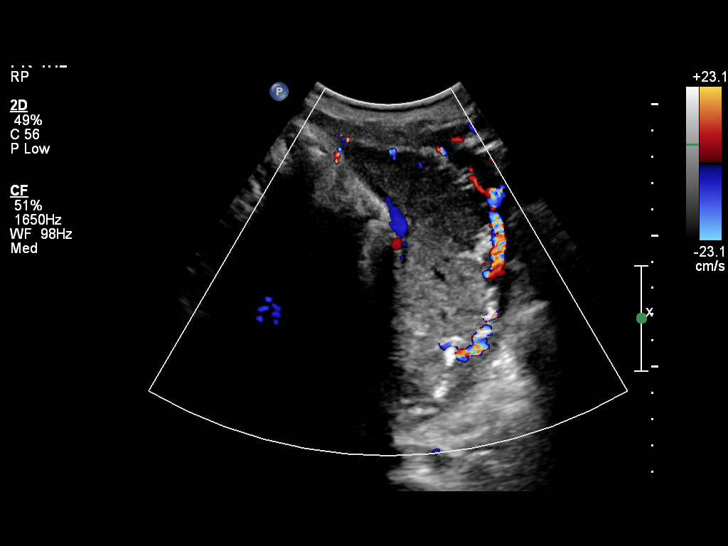
[im 19/27]
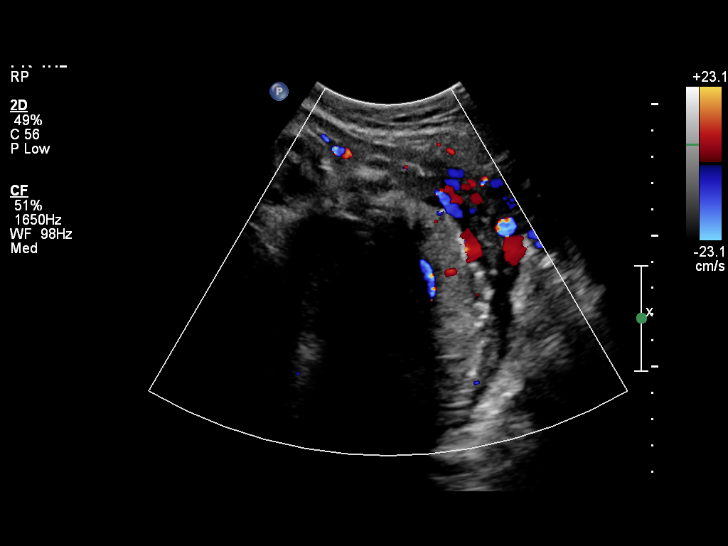
[im 21/27]
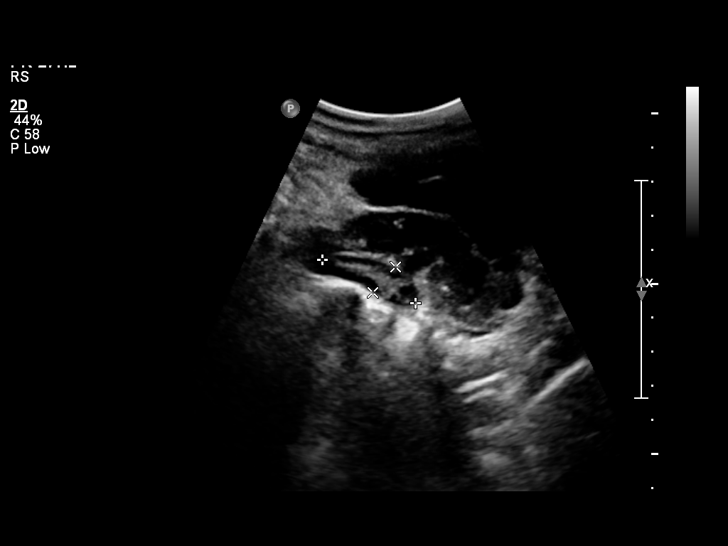
[im 23/27]
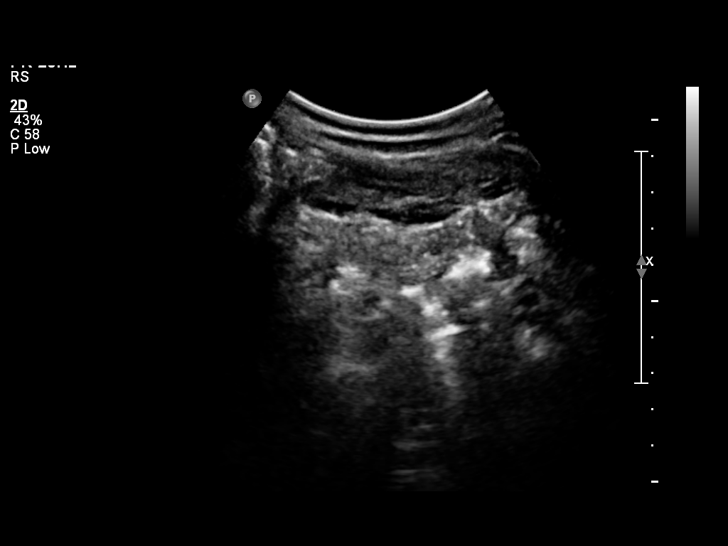
[im 25/27]
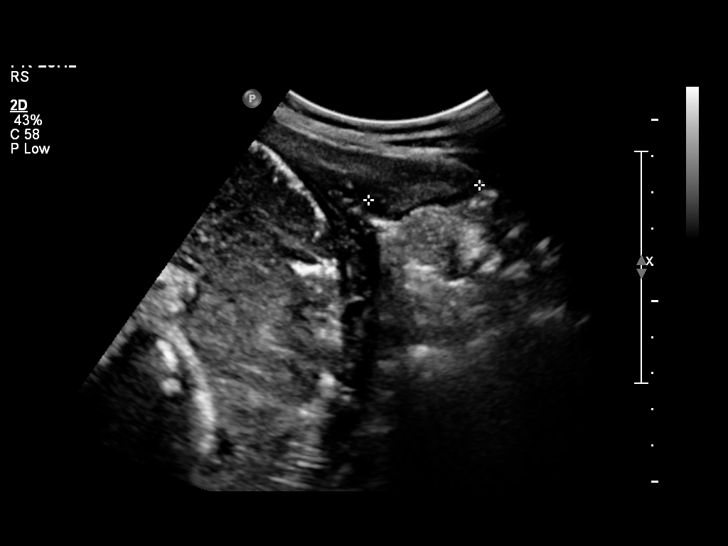
[im 27/27]
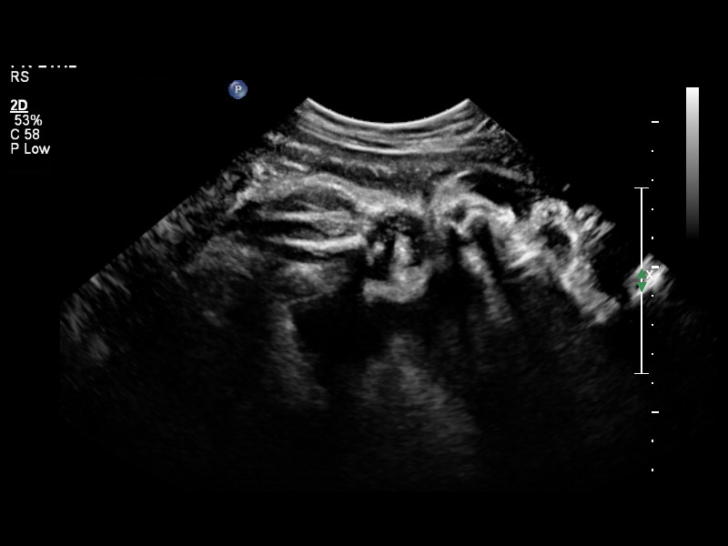

[14 of 27 positions shown; findings below may reference images not displayed]

LIMITED OBSTETRIC ULTRASOUND

Number of Fetuses: 1
Heart Rate: 149 bpm
Movement: Yes
Presentation: Cephalic
Placental Location: Posterior and lateral.  Left-sided.
Previa: No
Amniotic Fluid (Subjective): Decreased

Vertical pocket:  Zerocm      AFI: Zero cm (5%ile 7.1 cm, 95%ile
21.4. cm)

BPD: 9.17cm   37w   2d

MATERNAL FINDINGS:
Cervix: Not assessed
Uterus/Adnexae: Both ovaries are within normal limits.

Biophysical profile:

Time elapsed 7 minutes. Movement: 2, Breathing:2, Tone:2, Amniotic
Fluid:0

Total Score: [DATE]
IMPRESSION: 1.  Single living intrauterine pregnancy.  Fetal heart rate equals
149 beats per minute.
2.  Amniotic fluid index is equal to zero consistent with
anhydramnios

3.  Biophysical profile is equal to [DATE].

Recommend followup with non-emergent complete OB 14+ wk US
examination for fetal biometric evaluation and anatomic survey if
not already performed.

## 2014-01-16 ENCOUNTER — Emergency Department (HOSPITAL_COMMUNITY): Payer: Medicaid Other

## 2014-01-16 ENCOUNTER — Inpatient Hospital Stay (HOSPITAL_COMMUNITY)
Admission: EM | Admit: 2014-01-16 | Discharge: 2014-01-19 | DRG: 917 | Discharge status: Against Medical Advice | Payer: Medicaid Other | Attending: Pulmonary Disease | Admitting: Pulmonary Disease

## 2014-01-16 DIAGNOSIS — I498 Other specified cardiac arrhythmias: Secondary | ICD-10-CM | POA: Diagnosis not present

## 2014-01-16 DIAGNOSIS — J9690 Respiratory failure, unspecified, unspecified whether with hypoxia or hypercapnia: Secondary | ICD-10-CM

## 2014-01-16 DIAGNOSIS — Z781 Physical restraint status: Secondary | ICD-10-CM | POA: Diagnosis present

## 2014-01-16 DIAGNOSIS — T50901A Poisoning by unspecified drugs, medicaments and biological substances, accidental (unintentional), initial encounter: Secondary | ICD-10-CM

## 2014-01-16 DIAGNOSIS — F17200 Nicotine dependence, unspecified, uncomplicated: Secondary | ICD-10-CM | POA: Diagnosis present

## 2014-01-16 DIAGNOSIS — Z79899 Other long term (current) drug therapy: Secondary | ICD-10-CM

## 2014-01-16 DIAGNOSIS — Y9229 Other specified public building as the place of occurrence of the external cause: Secondary | ICD-10-CM

## 2014-01-16 DIAGNOSIS — J45909 Unspecified asthma, uncomplicated: Secondary | ICD-10-CM | POA: Diagnosis present

## 2014-01-16 DIAGNOSIS — R4189 Other symptoms and signs involving cognitive functions and awareness: Secondary | ICD-10-CM | POA: Diagnosis present

## 2014-01-16 DIAGNOSIS — F1210 Cannabis abuse, uncomplicated: Secondary | ICD-10-CM | POA: Diagnosis present

## 2014-01-16 DIAGNOSIS — F1410 Cocaine abuse, uncomplicated: Secondary | ICD-10-CM | POA: Diagnosis present

## 2014-01-16 DIAGNOSIS — E876 Hypokalemia: Secondary | ICD-10-CM | POA: Diagnosis present

## 2014-01-16 DIAGNOSIS — J690 Pneumonitis due to inhalation of food and vomit: Secondary | ICD-10-CM | POA: Diagnosis present

## 2014-01-16 DIAGNOSIS — T426X1A Poisoning by other antiepileptic and sedative-hypnotic drugs, accidental (unintentional), initial encounter: Secondary | ICD-10-CM | POA: Diagnosis present

## 2014-01-16 DIAGNOSIS — T405X1A Poisoning by cocaine, accidental (unintentional), initial encounter: Principal | ICD-10-CM | POA: Diagnosis present

## 2014-01-16 DIAGNOSIS — T40901A Poisoning by unspecified psychodysleptics [hallucinogens], accidental (unintentional), initial encounter: Secondary | ICD-10-CM | POA: Diagnosis present

## 2014-01-16 DIAGNOSIS — T43601A Poisoning by unspecified psychostimulants, accidental (unintentional), initial encounter: Secondary | ICD-10-CM | POA: Diagnosis present

## 2014-01-16 DIAGNOSIS — T4271XA Poisoning by unspecified antiepileptic and sedative-hypnotic drugs, accidental (unintentional), initial encounter: Secondary | ICD-10-CM

## 2014-01-16 DIAGNOSIS — T40904A Poisoning by unspecified psychodysleptics [hallucinogens], undetermined, initial encounter: Secondary | ICD-10-CM | POA: Diagnosis present

## 2014-01-16 DIAGNOSIS — J9600 Acute respiratory failure, unspecified whether with hypoxia or hypercapnia: Secondary | ICD-10-CM | POA: Diagnosis present

## 2014-01-16 DIAGNOSIS — R509 Fever, unspecified: Secondary | ICD-10-CM | POA: Diagnosis not present

## 2014-01-16 HISTORY — DX: Nicotine dependence, unspecified, uncomplicated: F17.200

## 2014-01-16 LAB — CBC WITH DIFFERENTIAL/PLATELET
BASOS PCT: 0 % (ref 0–1)
Basophils Absolute: 0 10*3/uL (ref 0.0–0.1)
Eosinophils Absolute: 0.3 10*3/uL (ref 0.0–0.7)
Eosinophils Relative: 4 % (ref 0–5)
HEMATOCRIT: 42.9 % (ref 36.0–46.0)
HEMOGLOBIN: 14.6 g/dL (ref 12.0–15.0)
LYMPHS ABS: 3.3 10*3/uL (ref 0.7–4.0)
Lymphocytes Relative: 43 % (ref 12–46)
MCH: 28.9 pg (ref 26.0–34.0)
MCHC: 34 g/dL (ref 30.0–36.0)
MCV: 84.8 fL (ref 78.0–100.0)
MONO ABS: 0.6 10*3/uL (ref 0.1–1.0)
MONOS PCT: 7 % (ref 3–12)
NEUTROS ABS: 3.6 10*3/uL (ref 1.7–7.7)
NEUTROS PCT: 46 % (ref 43–77)
Platelets: 268 10*3/uL (ref 150–400)
RBC: 5.06 MIL/uL (ref 3.87–5.11)
RDW: 13.2 % (ref 11.5–15.5)
WBC: 7.7 10*3/uL (ref 4.0–10.5)

## 2014-01-16 LAB — RAPID URINE DRUG SCREEN, HOSP PERFORMED
Amphetamines: NOT DETECTED
BARBITURATES: NOT DETECTED
Benzodiazepines: POSITIVE — AB
COCAINE: POSITIVE — AB
OPIATES: NOT DETECTED
Tetrahydrocannabinol: POSITIVE — AB

## 2014-01-16 LAB — COMPREHENSIVE METABOLIC PANEL
ALBUMIN: 4.2 g/dL (ref 3.5–5.2)
ALT: 13 U/L (ref 0–35)
AST: 17 U/L (ref 0–37)
Alkaline Phosphatase: 74 U/L (ref 39–117)
BUN: 9 mg/dL (ref 6–23)
CALCIUM: 9.4 mg/dL (ref 8.4–10.5)
CO2: 22 mEq/L (ref 19–32)
CREATININE: 0.82 mg/dL (ref 0.50–1.10)
Chloride: 104 mEq/L (ref 96–112)
GFR calc Af Amer: >90 mL/min (ref >90)
Glucose, Bld: 99 mg/dL (ref 70–99)
Potassium: 3.4 mEq/L — ABNORMAL LOW (ref 3.7–5.3)
Sodium: 145 mEq/L (ref 137–147)
Total Bilirubin: 0.4 mg/dL (ref 0.3–1.2)
Total Protein: 7.9 g/dL (ref 6.0–8.3)

## 2014-01-16 LAB — CBG MONITORING, ED: GLUCOSE-CAPILLARY: 81 mg/dL (ref 70–99)

## 2014-01-16 LAB — ETHANOL: Alcohol, Ethyl (B): <11 mg/dL (ref 0–11)

## 2014-01-16 LAB — ACETAMINOPHEN LEVEL

## 2014-01-16 LAB — SALICYLATE LEVEL

## 2014-01-16 LAB — POC URINE PREG, ED: Preg Test, Ur: NEGATIVE

## 2014-01-16 MED ORDER — ETOMIDATE 2 MG/ML IV SOLN
INTRAVENOUS | Status: AC
  Administered 2014-01-16: 20 mg
  Filled 2014-01-16: qty 20

## 2014-01-16 MED ORDER — PROPOFOL 10 MG/ML IV EMUL
INTRAVENOUS | Status: AC
  Filled 2014-01-16: qty 100

## 2014-01-16 MED ORDER — SODIUM CHLORIDE 0.9 % IV SOLN
25.0000 ug/h | INTRAVENOUS | Status: DC
  Administered 2014-01-17: 50 ug/h via INTRAVENOUS
  Administered 2014-01-17: 100 ug/h via INTRAVENOUS
  Filled 2014-01-16 (×2): qty 50

## 2014-01-16 MED ORDER — FLUMAZENIL 0.5 MG/5ML IV SOLN
0.2000 mg | Freq: Once | INTRAVENOUS | Status: AC
  Administered 2014-01-16: 0.2 mg via INTRAVENOUS

## 2014-01-16 MED ORDER — LIDOCAINE HCL (CARDIAC) 20 MG/ML IV SOLN
INTRAVENOUS | Status: AC
  Filled 2014-01-16: qty 5

## 2014-01-16 MED ORDER — FLUMAZENIL 0.5 MG/5ML IV SOLN
INTRAVENOUS | Status: AC
  Filled 2014-01-16: qty 5

## 2014-01-16 MED ORDER — ROCURONIUM BROMIDE 50 MG/5ML IV SOLN
INTRAVENOUS | Status: AC
Start: 2014-01-16 — End: 2014-01-16
  Administered 2014-01-16: 5 mg
  Filled 2014-01-16: qty 2

## 2014-01-16 MED ORDER — FENTANYL CITRATE 0.05 MG/ML IJ SOLN: 1.0000 ug/kg/h | INTRAVENOUS | Status: DC

## 2014-01-16 MED ORDER — MIDAZOLAM HCL 2 MG/2ML IJ SOLN
4.0000 mg | INTRAMUSCULAR | Status: AC | PRN
  Administered 2014-01-17 (×5): 4 mg via INTRAVENOUS
  Filled 2014-01-16 (×5): qty 4

## 2014-01-16 MED ORDER — PROPOFOL 10 MG/ML IV EMUL
5.0000 ug/kg/min | INTRAVENOUS | Status: DC
  Administered 2014-01-16: 7 ug/kg/min via INTRAVENOUS

## 2014-01-16 MED ORDER — SUCCINYLCHOLINE CHLORIDE 20 MG/ML IJ SOLN
INTRAMUSCULAR | Status: AC
  Filled 2014-01-16: qty 1

## 2014-01-16 NOTE — ED Notes (Signed)
[facility] Friend called [facility] due to pt acting "funny". Pt told friend that she took 15 phenergan tablets "partying". Pt very agitated when [employer] arrived and they had to hand cuff the patient for pt safety. [facility] found pt highly agitated, spitting, trying to bite EMS. EMS administered 5mg  Versed IM to calm down pt. Pt's heart rate in the 170's. They inserted 20g PIV. Pt respirations down to 8 pm post versed administration. Pt bagged a couple of times and placed NRB.

## 2014-01-16 NOTE — ED Provider Notes (Signed)
CSN: [id number]     Arrival date & time 01/16/14  2124 History   First MD Initiated Contact with Patient 01/16/14 2148     Chief Complaint  Patient presents with  . Drug Overdose   HPI 28 year old female presents with a drug overdose. Patient is unable to provide any history as she is unresponsive on arrival. History is obtained from EMS. They report that they received a call from her friend. Her friend reported that she showed up to the hotel room acting "funny". She told her friend that she had been partying and had taken 15 Phenergan tablets. She was very agitated when [facility] arrived for a separate call and they handcuffed her. When EMS arrived she was very agitated, was spitting, trying to bite EMS. She was not redirectable. EMS gave her 5 mg of IM Versed. When patient arrives via emergency department she is unresponsive. GCS is 6. She will not open her eyes to any stimuli, will not verbalize, withdraws from pain. Initially protecting her airway.  Review of records and traces she's had several ES is positive for cocaine and other drugs in the past. She has no pertinent medical history otherwise. There was no report of any suicide attempt, alcohol, or other drugs.   Past Medical History  Diagnosis Date  . Asthma   . Headache     migraines   Past Surgical History  Procedure Laterality Date  . Vaginal delivery      X 4  . No past surgeries    . Tubal ligation  07/09/2012    Procedure: POST PARTUM TUBAL LIGATION;  Surgeon: Catalina Antigua, MD;  Location: [facility];  Service: Gynecology;  Laterality: Bilateral;   Family History  Problem Relation Age of Onset  . Other Neg Hx    History  Substance Use Topics  . Smoking status: Current Every Day Smoker -- 1.00 packs/day    Types: Cigarettes  . Smokeless tobacco: Never Used  . Alcohol Use: Yes     Comment: Beginning of pregnancy   OB History   Grav Para Term Preterm Abortions TAB SAB Ect Mult Living   7 5 5  0 2 0 2  0 0 5     Review of Systems  Unable to perform ROS: Patient unresponsive      Allergies  Review of patient's allergies indicates no known allergies.  Home Medications   Current Outpatient Rx  Name  Route  Sig  Dispense  Refill  . Promethazine HCl (PHENERGAN PO)   Oral   Take by mouth once. Reported that patient took 15 tablets of phenergan         . albuterol (PROVENTIL HFA;VENTOLIN HFA) 108 (90 BASE) MCG/ACT inhaler   Inhalation   Inhale 2 puffs into the lungs every 6 (six) hours as needed. For shortness of breath         . cephALEXin (KEFLEX) 500 MG capsule   Oral   Take 1 capsule (500 mg total) by mouth 4 (four) times daily.   40 capsule   0   . hydrOXYzine (ATARAX/VISTARIL) 25 MG tablet   Oral   Take 1 tablet (25 mg total) by mouth every 6 (six) hours.   12 tablet   0   . midazolam (VERSED) 5 MG/5ML SOLN injection   Intravenous   Inject 5 mg into the vein once.          BP 159/126  Pulse 115  Temp(Src) 98 F (36.7 C) (Axillary)  Resp 16  Ht 5\' 5"  (1.651 m)  Wt 150 lb (68.04 kg)  BMI 24.96 kg/m2  SpO2 100% Physical Exam  Nursing note and vitals reviewed. Constitutional: She appears well-developed and well-nourished.  Unresponsive  HENT:  Head: Normocephalic and atraumatic.  Mouth/Throat: Oropharynx is clear and moist.  No scalp lacerations, abrasions, hematomas, or other signs of head injury.  Eyes: Conjunctivae are normal. Pupils are equal, round, and reactive to light. No scleral icterus.  Pupils 4 mm and reactive equal bilaterally  Neck: Normal range of motion. Neck supple.  Cardiovascular: Normal rate, regular rhythm, normal heart sounds and intact distal pulses.  Exam reveals no gallop and no friction rub.   No murmur heard. Pulmonary/Chest: Effort normal and breath sounds normal. No respiratory distress. She has no wheezes. She has no rales. She exhibits no tenderness.  Respiratory rate 20  Abdominal: Soft. She exhibits no distension.  There is no tenderness. There is no rebound and no guarding.  Musculoskeletal: She exhibits no edema.  Neurological:  Unresponsive, pupils 4 mm and reactive, equal bilaterally. GCS 7. No eye opening, no verbal response, localizes to pain. Moves all extremities equally in withdrawal from pain.  Skin: Skin is warm. No rash noted.    ED Course  Procedures (including critical care time) Labs Review Labs Reviewed  COMPREHENSIVE METABOLIC PANEL - Abnormal; Notable for the following:    Potassium 3.4 (*)    All other components within normal limits  SALICYLATE LEVEL - Abnormal; Notable for the following:    Salicylate Lvl <2.0 (*)    All other components within normal limits  URINE RAPID DRUG SCREEN (HOSP PERFORMED) - Abnormal; Notable for the following:    Cocaine POSITIVE (*)    Benzodiazepines POSITIVE (*)    Tetrahydrocannabinol POSITIVE (*)    All other components within normal limits  ACETAMINOPHEN LEVEL  CBC WITH DIFFERENTIAL  ETHANOL  CBG MONITORING, ED  POC URINE PREG, ED   Imaging Review Dg Chest Portable 1 View  01/16/2014   CLINICAL DATA:  Drug overdose.  Endotracheal tube placement.  EXAM: PORTABLE CHEST - 1 VIEW  COMPARISON:  Chest radiograph performed 11/24/2006  FINDINGS: The patient's endotracheal tube is seen ending just above the carina. This should be retracted 2-3 cm. The enteric tube is seen coiling within the stomach.  The lungs are well-aerated and clear. There is no evidence of focal opacification, pleural effusion or pneumothorax.  The cardiomediastinal silhouette is within normal limits. No acute osseous abnormalities are seen.  IMPRESSION: 1. Endotracheal tube seen ending just above the carina. This should be retracted 2-3 cm. 2. No acute cardiopulmonary process seen.  These results were called by telephone at the time of interpretation on 01/16/2014 at 11:11 PM to Dr. Nelva Nay, who verbally acknowledged these results.   Electronically Signed   By: Roanna Raider  M.D.   On: 01/16/2014 23:12     EKG Interpretation None      MDM   28 year old female with a history of polysubstance abuse presents unresponsive after a reported Phenergan overdose. reportedly took 15 tablets. She was combative in route requiring physical restraint by EMS and police. They gave her 5 mg IM Versed. On arrival to the ED she is unresponsive with a GCS of 7. She is moving all extremities equally. Initially protecting her airway.  Her head is atraumatic, remainder of her exam is atraumatic. She has a normal cardiopulmonary exam. Respiratory rate 20.  Shortly after arrival to the emergency department,  the patient again became agitated and combative. Nursing was unable to redirect the patient. She was requiring multiple people to restrain her. She began to vomit. Dr. Radford PaxBeaton intubated her for airway protection, see separate procedure note.  he was concerned that she aspirated and apparently intubation period.    Labs demonstrate essentially normal complete metabolic panel, CBC, alcohol, salicylates, acetaminophen. UDS is positive for cocaine and THC. Pregnancy test is negative. EKG demonstrates sinus tachycardia, normal PR, QRS, and QTC. No ischemic changes.  Spoke with poison control and they recommend that we watch for seizure activity. Otherwise a good all interventions thus far and anticipated the patient will potentially continue to be symptomatic for several hours. She is admitted to critical care for further management.   Final diagnoses:  Drug overdose  Respiratory failure       Toney SangJerrid Micharl Helmes, MD 01/17/14 0006

## 2014-01-16 NOTE — ED Notes (Signed)
18Fr OG Tube inserted by RN. Placement verified by RN. Portable chest Xray to be performed for verification of placement.

## 2014-01-17 ENCOUNTER — Inpatient Hospital Stay (HOSPITAL_COMMUNITY): Payer: Medicaid Other

## 2014-01-17 ENCOUNTER — Encounter (HOSPITAL_COMMUNITY): Payer: Self-pay

## 2014-01-17 DIAGNOSIS — R4189 Other symptoms and signs involving cognitive functions and awareness: Secondary | ICD-10-CM | POA: Diagnosis present

## 2014-01-17 DIAGNOSIS — T50901A Poisoning by unspecified drugs, medicaments and biological substances, accidental (unintentional), initial encounter: Secondary | ICD-10-CM

## 2014-01-17 DIAGNOSIS — J9600 Acute respiratory failure, unspecified whether with hypoxia or hypercapnia: Secondary | ICD-10-CM

## 2014-01-17 LAB — URINALYSIS, ROUTINE W REFLEX MICROSCOPIC
Bilirubin Urine: NEGATIVE
GLUCOSE, UA: NEGATIVE mg/dL
Hgb urine dipstick: NEGATIVE
KETONES UR: 15 mg/dL — AB
Nitrite: NEGATIVE
PROTEIN: NEGATIVE mg/dL
Specific Gravity, Urine: 1.012 (ref 1.005–1.030)
UROBILINOGEN UA: 0.2 mg/dL (ref 0.0–1.0)
pH: 7 (ref 5.0–8.0)

## 2014-01-17 LAB — APTT: aPTT: 29 seconds (ref 24–37)

## 2014-01-17 LAB — BASIC METABOLIC PANEL
BUN: 6 mg/dL (ref 6–23)
CALCIUM: 8.3 mg/dL — AB (ref 8.4–10.5)
CO2: 21 mEq/L (ref 19–32)
CREATININE: 0.64 mg/dL (ref 0.50–1.10)
Chloride: 108 mEq/L (ref 96–112)
GFR calc Af Amer: >90 mL/min (ref >90)
GFR calc non Af Amer: >90 mL/min (ref >90)
Glucose, Bld: 101 mg/dL — ABNORMAL HIGH (ref 70–99)
Potassium: 3.8 mEq/L (ref 3.7–5.3)
Sodium: 143 mEq/L (ref 137–147)

## 2014-01-17 LAB — PROTIME-INR
INR: 1 (ref 0.00–1.49)
Prothrombin Time: 13 seconds (ref 11.6–15.2)

## 2014-01-17 LAB — POCT I-STAT 3, ART BLOOD GAS (G3+)
Acid-base deficit: 2 mmol/L (ref 0.0–2.0)
Bicarbonate: 22.3 mEq/L (ref 20.0–24.0)
O2 SAT: 99 %
TCO2: 23 mmol/L (ref 0–100)
pCO2 arterial: 34.6 mmHg — ABNORMAL LOW (ref 35.0–45.0)
pH, Arterial: 7.416 (ref 7.350–7.450)
pO2, Arterial: 150 mmHg — ABNORMAL HIGH (ref 80.0–100.0)

## 2014-01-17 LAB — CBC
HEMATOCRIT: 35.4 % — AB (ref 36.0–46.0)
Hemoglobin: 12 g/dL (ref 12.0–15.0)
MCH: 28.4 pg (ref 26.0–34.0)
MCHC: 33.9 g/dL (ref 30.0–36.0)
MCV: 83.9 fL (ref 78.0–100.0)
PLATELETS: 261 10*3/uL (ref 150–400)
RBC: 4.22 MIL/uL (ref 3.87–5.11)
RDW: 13.4 % (ref 11.5–15.5)
WBC: 8.3 10*3/uL (ref 4.0–10.5)

## 2014-01-17 LAB — PHOSPHORUS: PHOSPHORUS: 2.7 mg/dL (ref 2.3–4.6)

## 2014-01-17 LAB — GLUCOSE, CAPILLARY
GLUCOSE-CAPILLARY: 116 mg/dL — AB (ref 70–99)
GLUCOSE-CAPILLARY: 76 mg/dL (ref 70–99)
Glucose-Capillary: 97 mg/dL (ref 70–99)
Glucose-Capillary: 97 mg/dL (ref 70–99)
Glucose-Capillary: 100 mg/dL — ABNORMAL HIGH (ref 70–99)

## 2014-01-17 LAB — PRO B NATRIURETIC PEPTIDE: Pro B Natriuretic peptide (BNP): 10.9 pg/mL (ref 0–125)

## 2014-01-17 LAB — LACTIC ACID, PLASMA: Lactic Acid, Venous: 1.6 mmol/L (ref 0.5–2.2)

## 2014-01-17 LAB — MAGNESIUM: Magnesium: 1.8 mg/dL (ref 1.5–2.5)

## 2014-01-17 LAB — TSH: TSH: 0.694 u[IU]/mL (ref 0.350–4.500)

## 2014-01-17 LAB — LIPASE, BLOOD: LIPASE: 13 U/L (ref 11–59)

## 2014-01-17 LAB — URINE MICROSCOPIC-ADD ON

## 2014-01-17 LAB — TROPONIN I: Troponin I: <0.3 ng/mL (ref <0.30)

## 2014-01-17 MED ORDER — PROPOFOL 10 MG/ML IV EMUL
INTRAVENOUS | Status: AC
Start: 2014-01-17 — End: 2014-01-17
  Filled 2014-01-17: qty 100

## 2014-01-17 MED ORDER — DEXMEDETOMIDINE HCL IN NACL 400 MCG/100ML IV SOLN
0.4000 ug/kg/h | INTRAVENOUS | Status: DC
  Administered 2014-01-17 (×2): 0.8 ug/kg/h via INTRAVENOUS
  Administered 2014-01-18: 1 ug/kg/h via INTRAVENOUS
  Filled 2014-01-17 (×4): qty 100

## 2014-01-17 MED ORDER — CHLORHEXIDINE GLUCONATE 0.12 % MT SOLN
15.0000 mL | Freq: Two times a day (BID) | OROMUCOSAL | Status: DC
  Administered 2014-01-17 – 2014-01-19 (×5): 15 mL via OROMUCOSAL
  Filled 2014-01-17 (×6): qty 15

## 2014-01-17 MED ORDER — POTASSIUM CHLORIDE 10 MEQ/100ML IV SOLN
10.0000 meq | INTRAVENOUS | Status: AC
  Administered 2014-01-17 (×3): 10 meq via INTRAVENOUS
  Filled 2014-01-17: qty 100

## 2014-01-17 MED ORDER — BIOTENE DRY MOUTH MT LIQD
15.0000 mL | Freq: Four times a day (QID) | OROMUCOSAL | Status: DC
  Administered 2014-01-17 – 2014-01-19 (×8): 15 mL via OROMUCOSAL

## 2014-01-17 MED ORDER — MAGNESIUM SULFATE 40 MG/ML IJ SOLN
2.0000 g | Freq: Once | INTRAMUSCULAR | Status: AC
  Administered 2014-01-17: 2 g via INTRAVENOUS
  Filled 2014-01-17: qty 50

## 2014-01-17 MED ORDER — SODIUM CHLORIDE 0.9 % IV BOLUS (SEPSIS)
1000.0000 mL | Freq: Once | INTRAVENOUS | Status: AC
  Administered 2014-01-17: 1000 mL via INTRAVENOUS

## 2014-01-17 MED ORDER — SODIUM CHLORIDE 0.9 % IV SOLN
INTRAVENOUS | Status: DC
  Administered 2014-01-17: 16:00:00 via INTRAVENOUS
  Administered 2014-01-17: 1000 mL via INTRAVENOUS

## 2014-01-17 MED ORDER — PROPOFOL 10 MG/ML IV EMUL
5.0000 ug/kg/min | INTRAVENOUS | Status: DC
Start: 2014-01-17 — End: 2014-01-17
  Administered 2014-01-17: 50 ug/kg/min via INTRAVENOUS

## 2014-01-17 MED ORDER — CHLORHEXIDINE GLUCONATE 0.12 % MT SOLN
OROMUCOSAL | Status: AC
  Administered 2014-01-17: 15 mL via OROMUCOSAL
  Filled 2014-01-17: qty 15

## 2014-01-17 MED ORDER — DEXMEDETOMIDINE HCL IN NACL 200 MCG/50ML IV SOLN: 0.4000 ug/kg/h | INTRAVENOUS | Status: DC

## 2014-01-17 MED ORDER — SODIUM CHLORIDE 0.9 % IV SOLN: 250.0000 mL | INTRAVENOUS | Status: DC | PRN

## 2014-01-17 MED ORDER — HEPARIN SODIUM (PORCINE) 5000 UNIT/ML IJ SOLN
5000.0000 [IU] | Freq: Three times a day (TID) | INTRAMUSCULAR | Status: DC
  Administered 2014-01-17 – 2014-01-19 (×6): 5000 [IU] via SUBCUTANEOUS
  Filled 2014-01-17 (×10): qty 1

## 2014-01-17 MED ORDER — DEXMEDETOMIDINE HCL IN NACL 200 MCG/50ML IV SOLN
0.4000 ug/kg/h | INTRAVENOUS | Status: DC
  Administered 2014-01-17: 0.8 ug/kg/h via INTRAVENOUS
  Filled 2014-01-17 (×2): qty 50

## 2014-01-17 MED ORDER — MIDAZOLAM HCL 2 MG/2ML IJ SOLN
2.0000 mg | INTRAMUSCULAR | Status: DC | PRN
  Administered 2014-01-17 – 2014-01-18 (×3): 2 mg via INTRAVENOUS
  Filled 2014-01-17 (×4): qty 2

## 2014-01-17 MED ORDER — FAMOTIDINE IN NACL 20-0.9 MG/50ML-% IV SOLN
20.0000 mg | Freq: Two times a day (BID) | INTRAVENOUS | Status: DC
  Administered 2014-01-17 – 2014-01-18 (×3): 20 mg via INTRAVENOUS
  Filled 2014-01-17 (×3): qty 50

## 2014-01-17 MED ORDER — PANTOPRAZOLE SODIUM 40 MG IV SOLR
40.0000 mg | Freq: Every day | INTRAVENOUS | Status: DC
  Filled 2014-01-17: qty 40

## 2014-01-17 NOTE — ED Notes (Signed)
Pt became increasingly agitated, attempting self extubate, bilateral wrist restraints ordered and put on pt.

## 2014-01-17 NOTE — Progress Notes (Addendum)
INITIAL NUTRITION ASSESSMENT  DOCUMENTATION CODES Per approved criteria  -Not Applicable   INTERVENTION:  If TF started, recommend: utilize 34M PEPuP Protocol: initiate Vital AF 1.2 formula at 20 ml/h on day 1; on day 2, increase to goal rate of 50 ml/h (1200 ml per day) to provide 1440 kcals, 90 gm protein, 973 ml free water daily RD to follow for nutrition care plan  NUTRITION DIAGNOSIS: Inadequate oral intake related to inability to eat as evidenced by NPO status  Goal: Initiation nutrition support within 24-48 hours of ICU admission if prolonged intubation expected  Monitor:  TF initiation & tolerance, respiratory status, weight, labs, I/O's  Reason for Assessment: VDRF  28 y.o. female  Admitting Dx: AMS  ASSESSMENT: [age] F known PSA history presenting with AMS in setting of reported phenergan injection and UDS positive for cocaine, BZD, and THC. Intubated for airway protection.  Patient is currently intubated on ventilator support -- OGT in place MV: 6.9 L/min Temp (24hrs), Avg:97.8 F (36.6 C), Min:97.5 F (36.4 C), Max:98 F (36.7 C)   Height: Ht Readings from Last 1 Encounters:  01/16/14 5\' 5"  (1.651 m)    Weight: Wt Readings from Last 1 Encounters:  01/17/14 123 lb 3.8 oz (55.9 kg)    Ideal Body Weight: 125 lb  % Ideal Body Weight: 98%  Wt Readings from Last 10 Encounters:  01/17/14 123 lb 3.8 oz (55.9 kg)  07/07/12 150 lb (68.04 kg)  07/07/12 150 lb (68.04 kg)  06/17/12 147 lb 9.6 oz (66.951 kg)  06/12/12 145 lb (65.772 kg)  06/05/12 140 lb (63.504 kg)  03/20/12 120 lb (54.432 kg)    Usual Body Weight: unable to obtain  % Usual Body Weight: ---  BMI:  Body mass index is 20.51 kg/(m^2).  Estimated Nutritional Needs: Kcal: 1400-1550 Protein: 85-95 gm Fluid: per Md  Skin: Intact  Diet Order: NPO  EDUCATION NEEDS: -No education needs identified at this time   Intake/Output Summary (Last 24 hours) at 01/17/14 0910 Last data filed at  01/17/14 0800  Gross per 24 hour  Intake 921.84 ml  Output    445 ml  Net 476.84 ml   Labs:   Recent Labs Lab 01/16/14 2137 01/17/14 0047 01/17/14 0742  NA 145  --  143  K 3.4*  --  3.8  CL 104  --  108  CO2 22  --  21  BUN 9  --  6  CREATININE 0.82  --  0.64  CALCIUM 9.4  --  8.3*  MG  --  1.8  --   PHOS  --  2.7  --   GLUCOSE 99  --  101*    CBG (last 3)   Recent Labs  01/17/14 0149 01/17/14 0345 01/17/14 0728  GLUCAP 97 97 116*    Scheduled Meds: . antiseptic oral rinse  15 mL Mouth Rinse QID  . chlorhexidine  15 mL Mouth Rinse BID  . heparin subcutaneous  5,000 Units Subcutaneous 3 times per day  . lidocaine (cardiac) 100 mg/255ml      . pantoprazole (PROTONIX) IV  40 mg Intravenous QHS  . propofol      . succinylcholine        Continuous Infusions: . sodium chloride 1,000 mL (01/17/14 0041)  . dexmedetomidine 0.8 mcg/kg/hr (01/17/14 0500)  . fentaNYL infusion INTRAVENOUS 100 mcg/hr (01/17/14 0230)    Past Medical History  Diagnosis Date  . Asthma   . Headache(784.0)     migraines  .  Smoker     Past Surgical History  Procedure Laterality Date  . Vaginal delivery      X 4  . No past surgeries    . Tubal ligation  07/09/2012    Procedure: POST PARTUM TUBAL LIGATION;  Surgeon: Catalina Antigua, MD;  Location: [facility];  Service: Gynecology;  Laterality: Bilateral;    Maureen Chatters, RD, LDN Pager #: [phone] After-Hours Pager #: [phone]

## 2014-01-17 NOTE — Progress Notes (Signed)
eLink Physician-Brief Progress Note Patient Name: Marsa ArisLabrisha C Hook DOB: 05-02-1986 MRN: [id number]  Date of Service  01/17/2014   HPI/Events of Note   Patient with polysubstance overdose with restlessness and agitation.    eICU Interventions  Versed 2 mg every 4 hrs prn      Henry RusselSMITH, Kiannah Grunow, P 01/17/2014, 10:22 PM

## 2014-01-17 NOTE — ED Notes (Signed)
Called Poison Control regarding potential overdose of phenergan. Poison Control stated that pt would need to be observed for seizure activity, agitation, and hypotension. Stated that QTc could become prolonged and recommended an EKG in the am. Stated supportive measures is best for phenergan overdose.

## 2014-01-17 NOTE — ED Notes (Signed)
Pt received 0.2mg  of Ramazicon and became agitated, threw up, and aspirated. Pt intubated at bedside.

## 2014-01-17 NOTE — H&P (Signed)
PULMONARY / CRITICAL CARE MEDICINE   Name: Patricia Dunn MRN: [id number] DOB: [birth year]    ADMISSION DATE:  01/16/2014 PRIMARY SERVICE: PCCM  CHIEF COMPLAINT:  AMS  BRIEF PATIENT DESCRIPTION:    Patricia Dunn is a 28 yo F with known PSA history who was brought in by ambulance (BIBA) after being found at a local motel with AMS. She is currently intubated and sedated and unable to provide any history. The following is obtained from chart review and the RNs. Patient's friend reportably called EMS when she was noted to be acting "funny" and she reported to him that she had taken 15 phenergen tablets. EMS unable to control patient's behavior at the seen and gave patient versed. She became more altered in ED, began vomiting, and was intubated for airway protection. There was possible aspiration during the intubation.    SIGNIFICANT EVENTS / STUDIES:  UDS (+) Cocaine, THC, and BZD 4/3 Intubated 4/3  LINES / TUBES: ETT 4/3  CULTURES: Blood Cultures 4/3 Urine Culture 4/3 Sputum Culture 4/3  ANTIBIOTICS: None   SUBJECTIVE:   01/17/14 repeat rounds: on fent and precedex. Agitated  with slightest stimulation  VITAL SIGNS: Temp:  [97.5 F (36.4 C)-98.6 F (37 C)] 98.6 F (37 C) (04/04 1203) Pulse Rate:  [54-140] 57 (04/04 1145) Resp:  [13-35] 16 (04/04 1145) BP: (84-179)/(44-134) 90/48 mmHg (04/04 1145) SpO2:  [96 %-100 %] 100 % (04/04 1145) FiO2 (%):  [35 %-40 %] 35 % (04/04 0922) Weight:  [55.9 kg (123 lb 3.8 oz)-68.04 kg (150 lb)] 55.9 kg (123 lb 3.8 oz) (04/04 0221) HEMODYNAMICS:   VENTILATOR SETTINGS: Vent Mode:  [-] PRVC FiO2 (%):  [35 %-40 %] 35 % Set Rate:  [16 bmp] 16 bmp Vt Set:  [400 mL] 400 mL PEEP:  [5 cmH20] 5 cmH20 Plateau Pressure:  [11 cmH20-21 cmH20] 18 cmH20 INTAKE / OUTPUT: Intake/Output     04/03 0701 - 04/04 0700 04/04 0701 - 04/05 0700   I.V. (mL/kg) 743 (13.3) 474.5 (8.5)   IV Piggyback 300    Total Intake(mL/kg) 1043 (18.7) 474.5 (8.5)   Urine  (mL/kg/hr) 260 435 (1.5)   Total Output 260 435   Net +783 +39.5          PHYSICAL EXAMINATION: General: NAD, in restraints Neuro:  Intubated and sedated HEENT:  Sclera anicteric, conjunctiva pink, MMM, ETT present, vomit in NG tube Cardiovascular:  RRR, NS1/S2, (-) MRG Lungs:  CTAB Abdomen:  S/NT/ND/(+)BS Musculoskeletal:  (-) C/C/E Skin:  Intact  LABS:  PULMONARY  Recent Labs Lab 01/17/14 0218  PHART 7.416  PCO2ART 34.6*  PO2ART 150.0*  HCO3 22.3  TCO2 23  O2SAT 99.0    CBC  Recent Labs Lab 01/16/14 2137 01/17/14 0320  HGB 14.6 12.0  HCT 42.9 35.4*  WBC 7.7 8.3  PLT 268 261    COAGULATION  Recent Labs Lab 01/17/14 0047  INR 1.00    CARDIAC   Recent Labs Lab 01/17/14 0047 01/17/14 0320  TROPONINI <0.30 <0.30    Recent Labs Lab 01/17/14 0047  PROBNP 10.9     CHEMISTRY  Recent Labs Lab 01/16/14 2137 01/17/14 0047 01/17/14 0742  NA 145  --  143  K 3.4*  --  3.8  CL 104  --  108  CO2 22  --  21  GLUCOSE 99  --  101*  BUN 9  --  6  CREATININE 0.82  --  0.64  CALCIUM 9.4  --  8.3*  MG  --  1.8  --   PHOS  --  2.7  --    Estimated Creatinine Clearance: 94.9 ml/min (by C-G formula based on Cr of 0.64).   LIVER  Recent Labs Lab 01/16/14 2137 01/17/14 0047  AST 17  --   ALT 13  --   ALKPHOS 74  --   BILITOT 0.4  --   PROT 7.9  --   ALBUMIN 4.2  --   INR  --  1.00     INFECTIOUS  Recent Labs Lab 01/17/14 0047  LATICACIDVEN 1.6     ENDOCRINE CBG (last 3)   Recent Labs  01/17/14 0149 01/17/14 0345 01/17/14 0728  GLUCAP 97 97 116*         IMAGING x48h  Ct Head Wo Contrast  01/17/2014   CLINICAL DATA:  Altered mental status, unresponsive  EXAM: CT HEAD WITHOUT CONTRAST  TECHNIQUE: Contiguous axial images were obtained from the base of the skull through the vertex without intravenous contrast.  COMPARISON:  Prior CT from 12/02/2006  FINDINGS: There is no acute intracranial hemorrhage or infarct. No  mass lesion or midline shift. Gray-white matter differentiation is well maintained. Ventricles are normal in size without evidence of hydrocephalus. CSF containing spaces are within normal limits. No extra-axial fluid collection.  The calvarium is intact.  Orbital soft tissues are within normal limits.  The paranasal sinuses and mastoid air cells are well pneumatized and free of fluid.  Scalp soft tissues are unremarkable.  IMPRESSION: No acute intracranial abnormality.   Electronically Signed   By: Rise MuBenjamin  McClintock M.D.   On: 01/17/2014 01:50   Dg Chest Port 1 View  01/17/2014   CLINICAL DATA:  Decreased breath sounds left chest  EXAM: PORTABLE CHEST - 1 VIEW  COMPARISON:  01/16/2014  FINDINGS: Cardiomediastinal silhouette is stable. No acute infiltrate or pulmonary edema. Mild left basilar atelectasis. Stable NG tube position. Endotracheal tube in place with tip 3.3 cm above the carina.  IMPRESSION: No acute infiltrate or pulmonary edema. Mild left basilar atelectasis. Stable NG tube position. Endotracheal tube in place with tip 3.3 cm above the carina.   Electronically Signed   By: Natasha MeadLiviu  Pop M.D.   On: 01/17/2014 09:47   Dg Chest Portable 1 View  01/16/2014   CLINICAL DATA:  Drug overdose.  Endotracheal tube placement.  EXAM: PORTABLE CHEST - 1 VIEW  COMPARISON:  Chest radiograph performed 11/24/2006  FINDINGS: The patient's endotracheal tube is seen ending just above the carina. This should be retracted 2-3 cm. The enteric tube is seen coiling within the stomach.  The lungs are well-aerated and clear. There is no evidence of focal opacification, pleural effusion or pneumothorax.  The cardiomediastinal silhouette is within normal limits. No acute osseous abnormalities are seen.  IMPRESSION: 1. Endotracheal tube seen ending just above the carina. This should be retracted 2-3 cm. 2. No acute cardiopulmonary process seen.  These results were called by telephone at the time of interpretation on 01/16/2014 at  11:11 PM to Dr. Nelva NayOBERT BEATON, who verbally acknowledged these results.   Electronically Signed   By: Roanna RaiderJeffery  Chang M.D.   On: 01/16/2014 23:12       ASSESSMENT / PLAN:  PULMONARY A:  Resp Failure 2/2 AMS: Thus far no evidence of significant side effect of aspiration. "Asthma": Details unknown. No evidence of exacerbation on exam. No record of maintenance meds in chart.   - does not meet extubation criterial  P:   Lung  protective ventilation VAP Prevention SBT once awake   CARDIOVASCULAR A: No acute issue  RENAL A:  hypomag  P:   Replete mag Serial BMPs  GASTROINTESTINAL A:  No Acute Issue  HEMATOLOGIC A:  No acute Issue  INFECTIOUS A:  No acute Issue P Check HIV  ENDOCRINE A:  No acute issue    NEUROLOGIC A:  AMS: Suspect 2/2 to combination of cocaine, THC and phenrgen (BZD likely 2/2 administration by EMS). Low suspicion for intracranial process but given the presence of cocaine will image. Seizures, etc very low on differential. ED has spoken with poison control - no specific therapy needed for phenergan. No evidence of EKG disturbance.    - CT head negative  P:   fent gtt and precedex Supportive Care Further investigation is doesn't clear over next 12-24 hrs   TODAY'S SUMMARY:   01/17/14: No family at bedside  I have personally obtained a history, examined the patient, evaluated laboratory and imaging results, formulated the assessment and plan and placed orders. CRITICAL CARE: The patient is critically ill with multiple organ systems failure and requires high complexity decision making for assessment and support, frequent evaluation and titration of therapies, application of advanced monitoring technologies and extensive interpretation of multiple databases. Critical Care Time devoted to patient care services described in this note is 30 minutes.   Dr. Kalman Shan, M.D., [facility].C.P Pulmonary and Critical Care Medicine Staff Physician [facility] [facility] Pager: [phone], If no answer or between  15:00h - 7:00h: call [phone]  01/17/2014 12:27 PM

## 2014-01-17 NOTE — H&P (Signed)
PULMONARY / CRITICAL CARE MEDICINE   Name: Patricia Dunn MRN: [id number] DOB: Nov 05, 1985    ADMISSION DATE:  01/16/2014 PRIMARY SERVICE: PCCM  CHIEF COMPLAINT:  AMS  BRIEF PATIENT DESCRIPTION: 47 F known PSA history presenting with AMS in setting of reported phenergan injection and UDS positive for cocaine, BZD, and THC. Intubated for airway protection. PCCM called for admission.   SIGNIFICANT EVENTS / STUDIES:  UDS (+) Cocaine, THC, and BZD 4/3 Intubated 4/3  LINES / TUBES: ETT 4/3  CULTURES: Blood Cultures 4/3 Urine Culture 4/3 Sputum Culture 4/3  ANTIBIOTICS: None  HISTORY OF PRESENT ILLNESS:  Patricia Dunn is a 28 yo F with known PSA history who was BIBA after being found at a local motel with AMS. She is currently intubated and sedated and unable to provide any history. The following is obtained from chart review and the RNs. Patient's friend reportably called EMS when she was noted to be acting "funny" and she reported to him that she had taken 15 phenergen tablets. EMS unable to control patient's behavior at the seen and gave patient versed. She became more altered in ED, began vomiting, and was intubated for airway protection. There was possible aspiration during the intubation.   PAST MEDICAL HISTORY :  Past Medical History  Diagnosis Date  . Asthma   . Headache     migraines   Past Surgical History  Procedure Laterality Date  . Vaginal delivery      X 4  . No past surgeries    . Tubal ligation  07/09/2012    Procedure: POST PARTUM TUBAL LIGATION;  Surgeon: Catalina Antigua, MD;  Location: [facility];  Service: Gynecology;  Laterality: Bilateral;   Prior to Admission medications   Medication Sig Start Date End Date Taking? Authorizing Provider  Promethazine HCl (PHENERGAN PO) Take by mouth once. Reported that patient took 15 tablets of phenergan   Yes Historical Provider, MD  albuterol (PROVENTIL HFA;VENTOLIN HFA) 108 (90 BASE) MCG/ACT inhaler Inhale 2 puffs into the  lungs every 6 (six) hours as needed. For shortness of breath    Historical Provider, MD  cephALEXin (KEFLEX) 500 MG capsule Take 1 capsule (500 mg total) by mouth 4 (four) times daily. 10/08/12   Geoffery Lyons, MD  hydrOXYzine (ATARAX/VISTARIL) 25 MG tablet Take 1 tablet (25 mg total) by mouth every 6 (six) hours. 10/08/12   Geoffery Lyons, MD  midazolam (VERSED) 5 MG/5ML SOLN injection Inject 5 mg into the vein once.    Historical Provider, MD   No Known Allergies  FAMILY HISTORY:  Family History  Problem Relation Age of Onset  . Other Neg Hx    SOCIAL HISTORY:  reports that she has been smoking Cigarettes.  She has been smoking about 1.00 pack per day. She has never used smokeless tobacco. She reports that she drinks alcohol. She reports that she uses illicit drugs (Marijuana and Cocaine).  REVIEW OF SYSTEMS:  Unable to obtain secondary to patient condition.  SUBJECTIVE:   VITAL SIGNS: Temp:  [98 F (36.7 C)] 98 F (36.7 C) (04/03 2139) Pulse Rate:  [95-115] 115 (04/03 2230) Resp:  [16-33] 16 (04/03 2230) BP: (108-159)/(62-126) 159/126 mmHg (04/03 2230) SpO2:  [98 %-100 %] 100 % (04/03 2230) FiO2 (%):  [40 %] 40 % (04/03 2239) Weight:  [150 lb (68.04 kg)] 150 lb (68.04 kg) (04/03 2312) HEMODYNAMICS:   VENTILATOR SETTINGS: Vent Mode:  [-] PRVC FiO2 (%):  [40 %] 40 % Set Rate:  [16 bmp]  16 bmp Vt Set:  [400 mL] 400 mL PEEP:  [5 cmH20] 5 cmH20 Plateau Pressure:  [11 cmH20] 11 cmH20 INTAKE / OUTPUT: Intake/Output   None     PHYSICAL EXAMINATION: General: NAD, in restraints Neuro:  Intubated and sedated HEENT:  Sclera anicteric, conjunctiva pink, MMM, ETT present, vomit in NG tube Cardiovascular:  RRR, NS1/S2, (-) MRG Lungs:  CTAB Abdomen:  S/NT/ND/(+)BS Musculoskeletal:  (-) C/C/E Skin:  Intact  LABS:  CBC  Recent Labs Lab 01/16/14 2137  WBC 7.7  HGB 14.6  HCT 42.9  PLT 268   Coag's No results found for this basename: APTT, INR,  in the last 168  hours BMET  Recent Labs Lab 01/16/14 2137  NA 145  K 3.4*  CL 104  CO2 22  BUN 9  CREATININE 0.82  GLUCOSE 99   Electrolytes  Recent Labs Lab 01/16/14 2137  CALCIUM 9.4   Sepsis Markers No results found for this basename: LATICACIDVEN, PROCALCITON, O2SATVEN,  in the last 168 hours ABG No results found for this basename: PHART, PCO2ART, PO2ART,  in the last 168 hours Liver Enzymes  Recent Labs Lab 01/16/14 2137  AST 17  ALT 13  ALKPHOS 74  BILITOT 0.4  ALBUMIN 4.2   Cardiac Enzymes No results found for this basename: TROPONINI, PROBNP,  in the last 168 hours Glucose  Recent Labs Lab 01/16/14 2153  GLUCAP 81    Imaging Dg Chest Portable 1 View  01/16/2014   CLINICAL DATA:  Drug overdose.  Endotracheal tube placement.  EXAM: PORTABLE CHEST - 1 VIEW  COMPARISON:  Chest radiograph performed 11/24/2006  FINDINGS: The patient's endotracheal tube is seen ending just above the carina. This should be retracted 2-3 cm. The enteric tube is seen coiling within the stomach.  The lungs are well-aerated and clear. There is no evidence of focal opacification, pleural effusion or pneumothorax.  The cardiomediastinal silhouette is within normal limits. No acute osseous abnormalities are seen.  IMPRESSION: 1. Endotracheal tube seen ending just above the carina. This should be retracted 2-3 cm. 2. No acute cardiopulmonary process seen.  These results were called by telephone at the time of interpretation on 01/16/2014 at 11:11 PM to Dr. Nelva Nay, who verbally acknowledged these results.   Electronically Signed   By: Roanna Raider M.D.   On: 01/16/2014 23:12   CXR: ETT at carina. Lungs clear.  ASSESSMENT / PLAN:  PULMONARY A:  Resp Failure 2/2 AMS: Thus far no evidence of significant side effect of aspiration. "Asthma": Details unknown. No evidence of exacerbation on exam. No record of maintenance meds in chart. P:   Lung protective ventilation VAP Prevention SBT once  awake Retract ETT if not already done and reshoot X-ray ABG to eval vent settings  CARDIOVASCULAR A: No acute issue  RENAL A:  Hypokalemia P:   Replete K Serial BMPs  GASTROINTESTINAL A:  No Acute Issue  HEMATOLOGIC A:  No acute Issue  INFECTIOUS A:  No acute Issue  ENDOCRINE A:  No acute issue    NEUROLOGIC A:  AMS: Suspect 2/2 to combination of cocaine, THC and phenrgen (BZD likely 2/2 administration by EMS). Low suspicion for intracranial process but given the presence of cocaine will image. Seizures, etc very low on differential. ED has spoken with poison control - no specific therapy needed for phenergan. No evidence of EKG disturbance.   P:   Head CT Supportive Care Minimize sedation Further investigation is doesn't clear over next 12-24 hrs  Favor using precedex/fentanyl for sedation  TODAY'S SUMMARY:   I have personally obtained a history, examined the patient, evaluated laboratory and imaging results, formulated the assessment and plan and placed orders. CRITICAL CARE: The patient is critically ill with multiple organ systems failure and requires high complexity decision making for assessment and support, frequent evaluation and titration of therapies, application of advanced monitoring technologies and extensive interpretation of multiple databases. Critical Care Time devoted to patient care services described in this note is 30 minutes.    Pulmonary and Critical Care Medicine [facility] Pager: [phone]  01/17/2014, 12:51 AM  Evalyn CascoAdam Maleak Brazzel, MD

## 2014-01-17 NOTE — ED Notes (Signed)
Attempted to call report

## 2014-01-17 NOTE — Progress Notes (Signed)
Patient restrained and combative at this time, RT unable to draw ABG. Will try at a later time.

## 2014-01-18 ENCOUNTER — Inpatient Hospital Stay (HOSPITAL_COMMUNITY): Payer: Medicaid Other

## 2014-01-18 DIAGNOSIS — J9600 Acute respiratory failure, unspecified whether with hypoxia or hypercapnia: Secondary | ICD-10-CM

## 2014-01-18 DIAGNOSIS — T50901A Poisoning by unspecified drugs, medicaments and biological substances, accidental (unintentional), initial encounter: Secondary | ICD-10-CM

## 2014-01-18 LAB — HEPATIC FUNCTION PANEL
ALBUMIN: 2.7 g/dL — AB (ref 3.5–5.2)
ALT: 10 U/L (ref 0–35)
AST: 35 U/L (ref 0–37)
Alkaline Phosphatase: 63 U/L (ref 39–117)
Bilirubin, Direct: <0.2 mg/dL (ref 0.0–0.3)
TOTAL PROTEIN: 5.6 g/dL — AB (ref 6.0–8.3)
Total Bilirubin: 0.4 mg/dL (ref 0.3–1.2)

## 2014-01-18 LAB — CBC WITH DIFFERENTIAL/PLATELET
BASOS PCT: 0 % (ref 0–1)
Basophils Absolute: 0 10*3/uL (ref 0.0–0.1)
Eosinophils Absolute: 0.3 10*3/uL (ref 0.0–0.7)
Eosinophils Relative: 2 % (ref 0–5)
HCT: 37.2 % (ref 36.0–46.0)
Hemoglobin: 12.1 g/dL (ref 12.0–15.0)
Lymphocytes Relative: 14 % (ref 12–46)
Lymphs Abs: 1.8 10*3/uL (ref 0.7–4.0)
MCH: 28.5 pg (ref 26.0–34.0)
MCHC: 32.5 g/dL (ref 30.0–36.0)
MCV: 87.7 fL (ref 78.0–100.0)
Monocytes Absolute: 0.7 10*3/uL (ref 0.1–1.0)
Monocytes Relative: 5 % (ref 3–12)
NEUTROS ABS: 10.1 10*3/uL — AB (ref 1.7–7.7)
NEUTROS PCT: 79 % — AB (ref 43–77)
Platelets: 214 10*3/uL (ref 150–400)
RBC: 4.24 MIL/uL (ref 3.87–5.11)
RDW: 13.6 % (ref 11.5–15.5)
WBC: 12.9 10*3/uL — ABNORMAL HIGH (ref 4.0–10.5)

## 2014-01-18 LAB — URINE CULTURE
CULTURE: NO GROWTH
Colony Count: NO GROWTH

## 2014-01-18 LAB — BASIC METABOLIC PANEL
BUN: 5 mg/dL — ABNORMAL LOW (ref 6–23)
CALCIUM: 8 mg/dL — AB (ref 8.4–10.5)
CO2: 19 mEq/L (ref 19–32)
Chloride: 106 mEq/L (ref 96–112)
Creatinine, Ser: 0.74 mg/dL (ref 0.50–1.10)
GFR calc Af Amer: >90 mL/min (ref >90)
Glucose, Bld: 134 mg/dL — ABNORMAL HIGH (ref 70–99)
Potassium: 3.8 mEq/L (ref 3.7–5.3)
SODIUM: 139 meq/L (ref 137–147)

## 2014-01-18 LAB — PROCALCITONIN: Procalcitonin: 0.3 ng/mL

## 2014-01-18 LAB — GLUCOSE, CAPILLARY
GLUCOSE-CAPILLARY: 116 mg/dL — AB (ref 70–99)
GLUCOSE-CAPILLARY: 63 mg/dL — AB (ref 70–99)
Glucose-Capillary: 105 mg/dL — ABNORMAL HIGH (ref 70–99)
Glucose-Capillary: 121 mg/dL — ABNORMAL HIGH (ref 70–99)
Glucose-Capillary: 138 mg/dL — ABNORMAL HIGH (ref 70–99)
Glucose-Capillary: 162 mg/dL — ABNORMAL HIGH (ref 70–99)
Glucose-Capillary: 77 mg/dL (ref 70–99)

## 2014-01-18 LAB — HIV ANTIBODY (ROUTINE TESTING W REFLEX): HIV: NONREACTIVE

## 2014-01-18 LAB — CK: CK TOTAL: 1263 U/L — AB (ref 7–177)

## 2014-01-18 LAB — PHOSPHORUS: PHOSPHORUS: 2.1 mg/dL — AB (ref 2.3–4.6)

## 2014-01-18 LAB — LACTIC ACID, PLASMA
LACTIC ACID, VENOUS: 0.9 mmol/L (ref 0.5–2.2)
Lactic Acid, Venous: 0.9 mmol/L (ref 0.5–2.2)

## 2014-01-18 LAB — MAGNESIUM: MAGNESIUM: 1.9 mg/dL (ref 1.5–2.5)

## 2014-01-18 MED ORDER — POTASSIUM PHOSPHATE DIBASIC 3 MMOLE/ML IV SOLN
10.0000 meq | Freq: Once | INTRAVENOUS | Status: AC
  Administered 2014-01-18: 10 meq via INTRAVENOUS
  Filled 2014-01-18: qty 2.27

## 2014-01-18 MED ORDER — SODIUM CHLORIDE 0.9 % IV SOLN
3.0000 g | Freq: Four times a day (QID) | INTRAVENOUS | Status: DC
  Administered 2014-01-18 – 2014-01-19 (×4): 3 g via INTRAVENOUS
  Filled 2014-01-18 (×7): qty 3

## 2014-01-18 MED ORDER — DEXTROSE-NACL 5-0.45 % IV SOLN
INTRAVENOUS | Status: DC
  Administered 2014-01-18: 01:00:00 via INTRAVENOUS

## 2014-01-18 MED ORDER — DEXTROSE 50 % IV SOLN
25.0000 mL | Freq: Once | INTRAVENOUS | Status: AC
  Administered 2014-01-18: 25 mL via INTRAVENOUS

## 2014-01-18 MED ORDER — ACETAMINOPHEN 650 MG RE SUPP: 650.0000 mg | Freq: Three times a day (TID) | RECTAL | Status: DC | PRN

## 2014-01-18 MED ORDER — ALPRAZOLAM 0.25 MG PO TABS
0.2500 mg | ORAL_TABLET | Freq: Two times a day (BID) | ORAL | Status: DC | PRN
  Administered 2014-01-18: 0.25 mg via ORAL
  Filled 2014-01-18: qty 1

## 2014-01-18 MED ORDER — ACETAMINOPHEN 160 MG/5ML PO SOLN
650.0000 mg | Freq: Four times a day (QID) | ORAL | Status: DC | PRN
  Administered 2014-01-18 (×2): 650 mg via ORAL
  Filled 2014-01-18 (×2): qty 20.3

## 2014-01-18 MED ORDER — DEXTROSE 50 % IV SOLN
INTRAVENOUS | Status: AC
  Administered 2014-01-18: 25 mL via INTRAVENOUS
  Filled 2014-01-18: qty 50

## 2014-01-18 MED ORDER — MAGNESIUM SULFATE IN D5W 10-5 MG/ML-% IV SOLN
1.0000 g | Freq: Once | INTRAVENOUS | Status: AC
  Administered 2014-01-18: 1 g via INTRAVENOUS
  Filled 2014-01-18: qty 100

## 2014-01-18 NOTE — Progress Notes (Signed)
ANTIBIOTIC CONSULT NOTE - INITIAL  Pharmacy Consult for Unasyn Indication: aspiration pneumonia  No Known Allergies Patient Measurements: Height: 5\' 5"  (165.1 cm) Weight: 122 lb 9.2 oz (55.6 kg) IBW/kg (Calculated) : 57 Vital Signs: Temp: 102 F (38.9 C) (04/05 0800) Temp src: Oral (04/05 0800) BP: 98/52 mmHg (04/05 0915) Pulse Rate: 61 (04/05 0915) Intake/Output from previous day: 04/04 0701 - 04/05 0700 In: 3328.7 [I.V.:3228.7; IV Piggyback:100] Out: 1170 [Urine:1170] Intake/Output from this shift: Total I/O In: 366.1 [I.V.:366.1] Out: 185 [Urine:185] Labs:  Recent Labs  01/16/14 2137 01/17/14 0320 01/17/14 0742 01/18/14 0239  WBC 7.7 8.3  --  12.9*  HGB 14.6 12.0  --  12.1  PLT 268 261  --  214  CREATININE 0.82  --  0.64 0.74   Estimated Creatinine Clearance: 94.4 ml/min (by C-G formula based on Cr of 0.74).  Microbiology: No results found for this or any previous visit (from the past 720 hour(s)).  Medical History: Past Medical History  Diagnosis Date  . Asthma   . Headache(784.0)     migraines  . Smoker     Assessment: [age] YOF with possible aspiration during intubation, fever, and leukocytosis to start Unasyn. Renal function is stable.   Goal of Therapy:  Clinical resolution of infection  Plan:  Unasyn 3g IV q6h Follow-up renal function and culture results  Patricia Dunn, PharmD, BCPS Clinical Pharmacist [phone] 01/18/2014,10:57 AM

## 2014-01-18 NOTE — Progress Notes (Signed)
Pts sedation orders d/c d/t sucessful planned extubation. 50 ml of 250 ml Fentanyl IV infusion wasted in sink with Dan MakerJames Dunlop, RN as witness.

## 2014-01-18 NOTE — Progress Notes (Signed)
PULMONARY / CRITICAL CARE MEDICINE   Name: Patricia Dunn MRN: [id number] DOB: 07-18-1986    ADMISSION DATE:  01/16/2014 CONSULTATION DATE:  4/4 PRIMARY SERVICE: PCCM  CHIEF COMPLAINT:  AMS  BRIEF PATIENT DESCRIPTION: 16 F known PSA history presenting with AMS in setting of reported phenergan injection and UDS positive for cocaine, BZD, and THC. Intubated for airway protection.  HISTORY OF PRESENT ILLNESS:  Patricia Dunn is a 28 yo F with known PSA history who was brought in by ambulance (BIBA) after being found at a local motel with AMS. She is currently intubated and sedated and unable to provide any history. The following is obtained from chart review and the RNs. Patient's friend reportably called EMS when she was noted to be acting "funny" and she reported to him that she had taken 15 phenergen tablets. EMS unable to control patient's behavior at the seen and gave patient versed. She became more altered in ED, began vomiting, and was intubated for airway protection. There was possible aspiration during the intubation.    SIGNIFICANT EVENTS / STUDIES:   4/3-UDS (+) Cocaine,THC, and BZD  4/3- XCR no acute infiltrate or pulmonary edema. Mild left basilar atelectasis 4/5-CXR no acute infiltrate or pulmonary edema  LINES / TUBES: ETT 4/3>>  CULTURES: 4/3- Blood Cultures 4/3>> 4/3 Urine Culture 4/3>> 4/3 Sputum Culture 4/3>> 01/18/14 HIV >> .Marland Kitchen... Blood cyulture Urine culture Sputum culture  ANTIBIOTICS: Unasyn 01/18/14>>    SUBJECTIVE:   Staff MD: Agitated signaling wants extubation on WUA. Oriented with questions on CAM-ICU test. However, febrile.   VITAL SIGNS: Temp:  [98 F (36.7 C)-101.4 F (38.6 C)] 101.4 F (38.6 C) (04/05 0400) Pulse Rate:  [51-89] 55 (04/05 0600) Resp:  [13-26] 16 (04/05 0600) BP: (81-117)/(43-76) 102/52 mmHg (04/05 0400) SpO2:  [96 %-100 %] 100 % (04/05 0600) FiO2 (%):  [30 %-35 %] 30 % (04/05 0400) Weight:  [122 lb 9.2 oz (55.6 kg)] 122 lb 9.2  oz (55.6 kg) (04/05 0500) HEMODYNAMICS:   VENTILATOR SETTINGS: Vent Mode:  [-] PRVC FiO2 (%):  [30 %-35 %] 30 % Set Rate:  [16 bmp] 16 bmp Vt Set:  [400 mL] 400 mL PEEP:  [5 cmH20] 5 cmH20 Plateau Pressure:  [18 cmH20-32 cmH20] 21 cmH20 INTAKE / OUTPUT: Intake/Output     04/04 0701 - 04/05 0700 04/05 0701 - 04/06 0700   I.V. (mL/kg) 3057.5 (55)    IV Piggyback 100    Total Intake(mL/kg) 3157.5 (56.8)    Urine (mL/kg/hr) 1170 (0.9)    Total Output 1170     Net +1987.5            PHYSICAL EXAMINATION: General:  Well built Neuro: Intubated and sedated but on WUA CAM- ICU negative with agitation o HEENT: Miosis (2 mm) bilaterally,  MMM, ETT present Cardiovascular: RRR, NS1/S2, (-) MRG  Lungs: CTAB  Abdomen: S/NT/ND/(+)BS  Musculoskeletal: (-) C/C/E  Skin: Intact  LABS:  CBC  Recent Labs Lab 01/16/14 2137 01/17/14 0320 01/18/14 0239  WBC 7.7 8.3 12.9*  HGB 14.6 12.0 12.1  HCT 42.9 35.4* 37.2  PLT 268 261 214   Coag's  Recent Labs Lab 01/17/14 0047  APTT 29  INR 1.00   BMET  Recent Labs Lab 01/16/14 2137 01/17/14 0742 01/18/14 0239  NA 145 143 139  K 3.4* 3.8 3.8  CL 104 108 106  CO2 22 21 19   BUN 9 6 5*  CREATININE 0.82 0.64 0.74  GLUCOSE 99 101* 134*  Electrolytes  Recent Labs Lab 01/16/14 2137 01/17/14 0047 01/17/14 0742 01/18/14 0239  CALCIUM 9.4  --  8.3* 8.0*  MG  --  1.8  --  1.9  PHOS  --  2.7  --  2.1*   Sepsis Markers  Recent Labs Lab 01/17/14 0047 01/18/14 0239  LATICACIDVEN 1.6 0.9   ABG  Recent Labs Lab 01/17/14 0218  PHART 7.416  PCO2ART 34.6*  PO2ART 150.0*   Liver Enzymes  Recent Labs Lab 01/16/14 2137 01/18/14 0239  AST 17 35  ALT 13 10  ALKPHOS 74 63  BILITOT 0.4 0.4  ALBUMIN 4.2 2.7*   Cardiac Enzymes  Recent Labs Lab 01/17/14 0047 01/17/14 0320 01/17/14 1242  TROPONINI <0.30 <0.30 <0.30  PROBNP 10.9  --   --    Glucose  Recent Labs Lab 01/17/14 0728 01/17/14 1149 01/17/14 2011  01/18/14 0021 01/18/14 0054 01/18/14 0429  GLUCAP 116* 100* 76 63* 162* 116*    Imaging Ct Head Wo Contrast  01/17/2014   CLINICAL DATA:  Altered mental status, unresponsive  EXAM: CT HEAD WITHOUT CONTRAST  TECHNIQUE: Contiguous axial images were obtained from the base of the skull through the vertex without intravenous contrast.  COMPARISON:  Prior CT from 12/02/2006  FINDINGS: There is no acute intracranial hemorrhage or infarct. No mass lesion or midline shift. Gray-white matter differentiation is well maintained. Ventricles are normal in size without evidence of hydrocephalus. CSF containing spaces are within normal limits. No extra-axial fluid collection.  The calvarium is intact.  Orbital soft tissues are within normal limits.  The paranasal sinuses and mastoid air cells are well pneumatized and free of fluid.  Scalp soft tissues are unremarkable.  IMPRESSION: No acute intracranial abnormality.   Electronically Signed   By: Rise Mu M.D.   On: 01/17/2014 01:50   Dg Chest Port 1 View  01/17/2014   CLINICAL DATA:  Decreased breath sounds left chest  EXAM: PORTABLE CHEST - 1 VIEW  COMPARISON:  01/16/2014  FINDINGS: Cardiomediastinal silhouette is stable. No acute infiltrate or pulmonary edema. Mild left basilar atelectasis. Stable NG tube position. Endotracheal tube in place with tip 3.3 cm above the carina.  IMPRESSION: No acute infiltrate or pulmonary edema. Mild left basilar atelectasis. Stable NG tube position. Endotracheal tube in place with tip 3.3 cm above the carina.   Electronically Signed   By: Natasha Mead M.D.   On: 01/17/2014 09:47   Dg Chest Portable 1 View  01/16/2014   CLINICAL DATA:  Drug overdose.  Endotracheal tube placement.  EXAM: PORTABLE CHEST - 1 VIEW  COMPARISON:  Chest radiograph performed 11/24/2006  FINDINGS: The patient's endotracheal tube is seen ending just above the carina. This should be retracted 2-3 cm. The enteric tube is seen coiling within the stomach.   The lungs are well-aerated and clear. There is no evidence of focal opacification, pleural effusion or pneumothorax.  The cardiomediastinal silhouette is within normal limits. No acute osseous abnormalities are seen.  IMPRESSION: 1. Endotracheal tube seen ending just above the carina. This should be retracted 2-3 cm. 2. No acute cardiopulmonary process seen.  These results were called by telephone at the time of interpretation on 01/16/2014 at 11:11 PM to Dr. Nelva Nay, who verbally acknowledged these results.   Electronically Signed   By: Roanna Raider M.D.   On: 01/16/2014 23:12    ASSESSMENT / PLAN:   PULMONARY A:   - Resp Failure 2/2 AMS.  - ?Asthma: Details unknown. No wheezing  now  -  - meets extubation criteria (staff note)  P:   - extubate (staff MD note)   CARDIOVASCULAR A: No acute issue except for bradycardia (on precedex now).  Trop negative X s  RENAL A:  - Hypophosphorus - HpoMg resolved - K, Mga normal  P:   -Replete Phos -Serial BMPs  GASTROINTESTINAL A:  No Acute Issue P: -Pepcid 20 mg IV bid   HEMATOLOGIC A:  No acute Issue  INFECTIOUS A:  fever with T 102, unclear etiology. CXR negative. Culture of urine, resp and Urine pending. 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  P (staff MD note) - rulue out NMS wit lactate and ck (low risk) - pan culture -- await HIV - empiric unasyn  ENDOCRINE A:  No acute issue    NEUROLOGIC A:   - AMS: Suspect 2/2 to combination of cocaine, THC and phenrgen (BZD likely 2/2 administration by EMS). ED has spoken with poison control - no specific therapy needed for phenergan.  - Low suspicion for intracranial process given CT head negative   - Agitated due to et tube but oriented   P:  (staff comment) DC fent gtt Use prcedex prn post extubation  Supportive Care  [place] Niu PGY3 [phone]    TODAY'S SUMMARY:   I have personally obtained a history, examined the patient, evaluated  laboratory and imaging results, formulated the assessment and plan and placed orders. See my specific comments under "staff comment"  CRITICAL CARE: The patient is critically ill with multiple organ systems failure and requires high complexity decision making for assessment and support, frequent evaluation and titration of therapies, application of advanced monitoring technologies and extensive interpretation of multiple databases. Critical Care Time devoted to patient care services described in this note is 30 minutes.     Dr. Kalman ShanMurali Sumi Lye, M.D., [facility] Center[facility]P Pulmonary and Critical Care Medicine Staff Physician [facility] [place] [facility] Pager: [phone], If no answer or between  15:00h - 7:00h: call [phone]  01/18/2014 10:26 AM

## 2014-01-18 NOTE — Procedures (Signed)
Extubation Procedure Note  Patient Details:   Name: Marsa ArisLabrisha C Maultsby DOB: 16-Dec-1985 MRN: [id number]   Airway Documentation:     Evaluation  O2 sats: stable throughout Complications: No apparent complications Patient did tolerate procedure well. Bilateral Breath Sounds: Rhonchi;Diminished   Yes Patient extubated to 4LNC. Vital signs stable at this time. No complications. RT will continue to monitor   Ave Filterdkins, Etta Gassett Williams 01/18/2014, 12:44 PM

## 2014-01-18 NOTE — Progress Notes (Signed)
CRITICAL VALUE ALERT  Critical value received:CBG 63  Date of notification:01/18/14  Time of notification: 00.08  Critical value read back: no, results verified at bedside  Nurse who received alert:Yassin Scales RN  MD notified (1st page): Dr Wylie Hailzub.  Time of first page: 00.08  MD notified (2nd page):  Time of second page:  Responding MD:Dr Zub.  Time MD responded: 00.08

## 2014-01-18 NOTE — Progress Notes (Addendum)
-   Patient is reevaluated, and is doing fine after extubation. Will discontinue prexedexa now. If doing fine, will transfer to SDU.   -Consulted to psychiatry    Lorretta HarpXilin Jakwon Gayton, MD PGY3, Internal Medicine Teaching Service Pager: [phone]

## 2014-01-19 ENCOUNTER — Encounter (HOSPITAL_COMMUNITY): Payer: Self-pay | Admitting: Orthopedic Surgery

## 2014-01-19 DIAGNOSIS — T50901A Poisoning by unspecified drugs, medicaments and biological substances, accidental (unintentional), initial encounter: Secondary | ICD-10-CM

## 2014-01-19 DIAGNOSIS — J9600 Acute respiratory failure, unspecified whether with hypoxia or hypercapnia: Secondary | ICD-10-CM

## 2014-01-19 LAB — CBC
HCT: 41.6 % (ref 36.0–46.0)
Hemoglobin: 14.1 g/dL (ref 12.0–15.0)
MCH: 28.5 pg (ref 26.0–34.0)
MCHC: 33.9 g/dL (ref 30.0–36.0)
MCV: 84.2 fL (ref 78.0–100.0)
Platelets: 275 10*3/uL (ref 150–400)
RBC: 4.94 MIL/uL (ref 3.87–5.11)
RDW: 13.1 % (ref 11.5–15.5)
WBC: 11 10*3/uL — AB (ref 4.0–10.5)

## 2014-01-19 LAB — BASIC METABOLIC PANEL
BUN: <3 mg/dL — ABNORMAL LOW (ref 6–23)
CHLORIDE: 106 meq/L (ref 96–112)
CO2: 23 meq/L (ref 19–32)
CREATININE: 0.69 mg/dL (ref 0.50–1.10)
Calcium: 9.4 mg/dL (ref 8.4–10.5)
GFR calc non Af Amer: >90 mL/min (ref >90)
Glucose, Bld: 88 mg/dL (ref 70–99)
Potassium: 3.2 mEq/L — ABNORMAL LOW (ref 3.7–5.3)
SODIUM: 146 meq/L (ref 137–147)

## 2014-01-19 LAB — GLUCOSE, CAPILLARY
GLUCOSE-CAPILLARY: 106 mg/dL — AB (ref 70–99)
GLUCOSE-CAPILLARY: 108 mg/dL — AB (ref 70–99)

## 2014-01-19 LAB — PHOSPHORUS: PHOSPHORUS: 2 mg/dL — AB (ref 2.3–4.6)

## 2014-01-19 LAB — MAGNESIUM: Magnesium: 2 mg/dL (ref 1.5–2.5)

## 2014-01-19 MED ORDER — ALPRAZOLAM 0.25 MG PO TABS
0.2500 mg | ORAL_TABLET | Freq: Four times a day (QID) | ORAL | Status: DC | PRN
  Administered 2014-01-19: 0.25 mg via ORAL
  Filled 2014-01-19 (×2): qty 1

## 2014-01-19 MED ORDER — ALPRAZOLAM 0.25 MG PO TABS
0.2500 mg | ORAL_TABLET | Freq: Every evening | ORAL | Status: DC | PRN
  Administered 2014-01-19: 0.25 mg via ORAL

## 2014-01-19 MED ORDER — ACETAMINOPHEN 325 MG PO TABS: 650.0000 mg | ORAL_TABLET | Freq: Four times a day (QID) | ORAL | Status: DC | PRN

## 2014-01-19 NOTE — Progress Notes (Signed)
PCCM Progress Note:  Pt seen, examined, and cleared by Psych.  Psych recs were for pt to follow up as an outpatient for substance abuse counseling.  No recs made for psychotropic meds.  I was on my way to discharge the pt at 17:20; however, she decided to leave AMA before I could see her.   Rutherford Guysahul Dennice Tindol, PA - [facility] Pgr: [phone]  or [phone]

## 2014-01-19 NOTE — Discharge Summary (Signed)
Physician Discharge Summary  Patient ID: LEEASIA Patricia Dunn MRN: [id number] DOB/AGE: 01/17/1986 28 y.o.  Admit date: 01/16/2014 Discharge date: 01/19/2014    Discharge Diagnoses:  Acute Encephalopathy Overdose Fever Acute Respiratory Failure                                                                      DISCHARGE PLAN BY DIAGNOSIS     Acute Encephalopathy Overdose Polysubstance Abuse Discharge Plan:  - Seen by Psych during hospitalization, they recommended outpatient follow up. - Substance abuse counseling.                 DISCHARGE SUMMARY   Patricia Dunn is a 28 y.o. y/o female with a PMH of headaches, asthma, and tobacco abuse who presented to [facility] on 4/3 via EMS with altered mental status in the setting of reported phenergan injection, cocaine, benzodiazepine & THC positive UDS.  She reportedly told friends that she took 15 phenergan tablets.  She became significantly agitated with EMS & [employer] (trying to bite staff) and was given versed IM.  On presentation, her respirations were 8/min.   Poison Control notified on admission.  Initial CXR was negative for acute infiltrate or pulmonary edema.  She was given Romazicon in ER and vomited with concern for aspiration.  She was intubated in the ER for airway protection.  Patient remained on mechanical ventilation for approximately 24 hours.  Empiric unasyn administered for possible aspiration PNA.  Cultures to date are negative.  Patient required no further pulmonary interventions.  Hospital course complicated by transient hypomagnesemia and isolated fever.  No identifiable source of infection.  Psychiatry consulted 4/6, they saw the pt and did not consider her a threat to herself or others.  They cleared her from psych perspective and recommended that she follow up as an outpatient for substance abuse. Pt decided that she did not want to wait for any discharge paperwork and decided to leave against medical advice on  4/6.  She was warned of potential risks involved with leaving AMA including possible death.  She was adamant that she wanted to leave and was in fact cursing at nursing staff. AMA paperwork signed and pt left the floor.  Pt did not receive any prescriptions on discharge.            SIGNIFICANT EVENTS / STUDIES:  4/3  UDS (+) Cocaine,THC, and BZD  4/3  XCR no acute infiltrate or pulmonary edema. Mild left basilar atelectasis  4/5  CXR no acute infiltrate or pulmonary edema   LINES / TUBES:  ETT 4/3>>4/4   CULTURES:  4/3 Blood Cultures>>  4/3 Urine Culture >>neg 4/3 Sputum Culture>>  4/5  HIV >>neg  4/5 UC>>>  ANTIBIOTICS:  Unasyn 01/18/14>>4/6    Discharge Exam: General: Well built  Neuro: alert and Ox3  HEENT: Miosis (2 mm) bilaterally, MMM,  Cardiovascular: RRR, NS1/S2, (-) MRG  Lungs: CTAB  Abdomen: S/NT/ND/(+)BS  Musculoskeletal: (-) C/C/E  Skin: Intact    Filed Vitals:   01/18/14 2358 01/19/14 0153 01/19/14 0500 01/19/14 1440  BP: 134/83  140/95 120/83  Pulse: 88  95 58  Temp: 99.6 F (37.6 C)  98.8 F (37.1 C) 98 F (36.7 C)  TempSrc: Oral  Oral Oral  Resp: _0 Height:      Weight:  133 lb (60.328 kg)    SpO2: 100%  100% 100%     Discharge Labs  BMET  Recent Labs Lab 01/16/14 2137 01/17/14 0047 01/17/14 0742 01/18/14 0239 01/19/14 0840  NA 145  --  143 139 146  K 3.4*  --  3.8 3.8 3.2*  CL 104  --  108 106 106  CO2 22  --  _1 GLUCOSE 99  --  101* 134* 88  BUN 9  --  6 5* <3*  CREATININE 0.82  --  0.64 0.74 0.69  CALCIUM 9.4  --  8.3* 8.0* 9.4  MG  --  1.8  --  1.9 2.0  PHOS  --  2.7  --  2.1* 2.0*   CBC  Recent Labs Lab 01/17/14 0320 01/18/14 0239 01/19/14 0840  HGB 12.0 12.1 14.1  HCT 35.4* 37.2 41.6  WBC 8.3 12.9* 11.0*  PLT 261 214 275   Anti-Coagulation  Recent Labs Lab 01/17/14 0047  INR 1.00      Medication List    ASK your doctor about these medications       midazolam 5 MG/5ML Soln injection   Commonly known as:  VERSED  Inject 5 mg into the vein once.     PHENERGAN PO  Take by mouth once. Reported that patient took 15 tablets of phenergan          Disposition: Home  Discharged Condition: Patricia Dunn has met maximum benefit of inpatient care and is medically stable and cleared for discharge.  Patient is pending follow up as above.      Time spent on disposition:  Greater than 45 minutes.    Montey Hora, [place] [facility] Pgr: [phone]  or [phone]

## 2014-01-19 NOTE — Progress Notes (Signed)
NUTRITION FOLLOW UP  Intervention:   General healthful diet No additional interventions warranted at this time  Nutrition Dx:   Inadequate oral intake related to inability to eat as evidenced by NPO status; discontinued  Goal:   Pt to meet >/= 90% of their estimated nutrition needs; being met  Monitor:   Respiratory status, weight, labs, I/O's  Assessment:   69 F known PSA history presenting with AMS in setting of reported phenergan injection and UDS positive for cocaine, BZD, and THC. Intubated for airway protection.  Pt was extubated 4/5 and diet advanced early this AM. Pt's weight has increased 10 lbs since admission. +3 L fluid balance.  Pt states her appetite is good and she ate most of her meals today. She denies any difficulty chewing or swallowing. Pt has no questions or concerns at this time. Encouraged ongoing adequate PO intake.   Labs: Low potassium, low phosphorus   Height: Ht Readings from Last 1 Encounters:  01/16/14 _0  (1.651 m)    Weight Status:   Wt Readings from Last 1 Encounters:  01/19/14 133 lb (60.328 kg)    Re-estimated needs:  Kcal: 1700-1900 Protein: 55-65 grams Fluid: 1.9 L/day  Skin: intact  Diet Order: General   Intake/Output Summary (Last 24 hours) at 01/19/14 1435 Last data filed at 01/19/14 0100  Gross per 24 hour  Intake   1094 ml  Output   1600 ml  Net   -506 ml    Last BM: 4/5   Labs:   Recent Labs Lab 01/17/14 0047 01/17/14 0742 01/18/14 0239 01/19/14 0840  NA  --  143 139 146  K  --  3.8 3.8 3.2*  CL  --  108 106 106  CO2  --  _1 BUN  --  6 5* <3*  CREATININE  --  0.64 0.74 0.69  CALCIUM  --  8.3* 8.0* 9.4  MG 1.8  --  1.9 2.0  PHOS 2.7  --  2.1* 2.0*  GLUCOSE  --  101* 134* 88    CBG (last 3)   Recent Labs  01/18/14 2028 01/19/14 0001 01/19/14 1100  GLUCAP 77 108* 106*    Scheduled Meds:   Continuous Infusions:   Pryor Ochoa RD, LDN Inpatient Clinical Dietitian Pager:  [phone] After Hours Pager: [phone]

## 2014-01-19 NOTE — Consult Note (Signed)
Reason for Consult: Overdose Referring Physician: Dr. Haynes Hoehn is an 28 y.o. female.  HPI: Patient was seen and chart reviewed. Case discussed with patient's staff RN, physician and patient aunt on the phone, reportedly patient aunt has no safety concerns about the patient. Patient has been staying with 5 children and reportedly taking care of her children with the help of several family members and baby father. Patient reported she was surprised when she was opening is in the hospital.   reportedly she went out with the home-girl who has been in contact with her about a week then smoking a blunt which has laced with cocaine and also stated that she does not know how she was taken phener as other people told her. Patient endorses abusing marijuana but denied other drug of abuse. Patient can't reported that she was recently had a surgery on her eye and she needed patient will be assisting her while she is recovering. Patient is willing to stay with family and not to abuse drugs and contracts for safety. Patient is also willing to follow up with outpatient psychiatric services. Patient has no previous acute psychiatric hospitalization. Patient urine drug screen is positive for cocaine,  benzodiazepines and tetrahydrocannabinol.  She has similar drugs in the past UDS in about a year ago. She has no pertinent medical history otherwise. There was no report of any suicide attempt, substance dependence.   ROS: Patient is anxious and asking going home and if family support   Mental Status Examination: Patient appeared as per his stated age, and fairly groomed, and maintaining good eye contact. Patient has anxious mood and affect.  she  has normal rate, rhythm, and volume of speech. her  thought process is linear and goal directed. Patient has denied suicidal, homicidal ideations, intentions or plans. Patient has no evidence of auditory or visual hallucinations, delusions, and paranoia. Patient has  fair insight judgment and impulse control.  Past Medical History  Diagnosis Date  . Asthma   . Headache(784.0)     migraines  . Smoker     Past Surgical History  Procedure Laterality Date  . Vaginal delivery      X 4  . No past surgeries    . Tubal ligation  07/09/2012    Procedure: POST PARTUM TUBAL LIGATION;  Surgeon: Mora Bellman, MD;  Location: [facility];  Service: Gynecology;  Laterality: Bilateral;    Family History  Problem Relation Age of Onset  . Other Neg Hx     Social History:  reports that she has been smoking Cigarettes.  She has been smoking about 1.00 pack per day. She has never used smokeless tobacco. She reports that she drinks alcohol. She reports that she uses illicit drugs (Marijuana and Cocaine).  Allergies: No Known Allergies  Medications: I have reviewed the patient's current medications.  Results for orders placed during the hospital encounter of 01/16/14 (from the past 48 hour(s))  GLUCOSE, CAPILLARY     Status: None   Collection Time    01/17/14  8:11 PM      Result Value Ref Range   Glucose-Capillary 76  70 - 99 mg/dL  GLUCOSE, CAPILLARY     Status: Abnormal   Collection Time    01/18/14 12:21 AM      Result Value Ref Range   Glucose-Capillary 63 (*) 70 - 99 mg/dL  GLUCOSE, CAPILLARY     Status: Abnormal   Collection Time    01/18/14 12:54 AM  Result Value Ref Range   Glucose-Capillary 162 (*) 70 - 99 mg/dL   Comment 1 Notify RN    HIV ANTIBODY (ROUTINE TESTING)     Status: None   Collection Time    01/18/14  2:39 AM      Result Value Ref Range   HIV NON REACTIVE  NON REACTIVE   Comment: (NOTE)     Effective January 19, 2014, [employer] will no longer offer the     current 3rd Generation HIV diagnostic screening assay, HIV Antibodies,     HIV-1/2 EIA, with reflexes. At that time, [employer] will     only offer HIV-1/2 Ag/Ab, 4th Gen, w/ Reflexes as recommended by the     CDC. This HIV diagnostic screening assay  tests for antibodies to HIV-1     and HIV-2 as well as HIV p24 antigen and provides greater sensitivity     for the detection of recent infection. Any orders for the 3rd     Generation assay will automatically be referred to the 4th Generation     assay.     Performed at [facility]     Status: Abnormal   Collection Time    01/18/14  2:39 AM      Result Value Ref Range   Sodium 139  137 - 147 mEq/L   Potassium 3.8  3.7 - 5.3 mEq/L   Chloride 106  96 - 112 mEq/L   CO2 19  19 - 32 mEq/L   Glucose, Bld 134 (*) 70 - 99 mg/dL   BUN 5 (*) 6 - 23 mg/dL   Creatinine, Ser 0.74  0.50 - 1.10 mg/dL   Calcium 8.0 (*) 8.4 - 10.5 mg/dL   GFR calc non Af Amer >90  >90 mL/min   GFR calc Af Amer >90  >90 mL/min   Comment: (NOTE)     The eGFR has been calculated using the CKD EPI equation.     This calculation has not been validated in all clinical situations.     eGFR's persistently <90 mL/min signify possible Chronic Kidney     Disease.  PHOSPHORUS     Status: Abnormal   Collection Time    01/18/14  2:39 AM      Result Value Ref Range   Phosphorus 2.1 (*) 2.3 - 4.6 mg/dL  MAGNESIUM     Status: None   Collection Time    01/18/14  2:39 AM      Result Value Ref Range   Magnesium 1.9  1.5 - 2.5 mg/dL  CBC WITH DIFFERENTIAL     Status: Abnormal   Collection Time    01/18/14  2:39 AM      Result Value Ref Range   WBC 12.9 (*) 4.0 - 10.5 K/uL   RBC 4.24  3.87 - 5.11 MIL/uL   Hemoglobin 12.1  12.0 - 15.0 g/dL   HCT 37.2  36.0 - 46.0 %   MCV 87.7  78.0 - 100.0 fL   MCH 28.5  26.0 - 34.0 pg   MCHC 32.5  30.0 - 36.0 g/dL   RDW 13.6  11.5 - 15.5 %   Platelets 214  150 - 400 K/uL   Neutrophils Relative % 79 (*) 43 - 77 %   Neutro Abs 10.1 (*) 1.7 - 7.7 K/uL   Lymphocytes Relative 14  12 - 46 %   Lymphs Abs 1.8  0.7 - 4.0 K/uL  Monocytes Relative 5  3 - 12 %   Monocytes Absolute 0.7  0.1 - 1.0 K/uL   Eosinophils Relative 2  0 - 5 %   Eosinophils Absolute 0.3   0.0 - 0.7 K/uL   Basophils Relative 0  0 - 1 %   Basophils Absolute 0.0  0.0 - 0.1 K/uL  LACTIC ACID, PLASMA     Status: None   Collection Time    01/18/14  2:39 AM      Result Value Ref Range   Lactic Acid, Venous 0.9  0.5 - 2.2 mmol/L  HEPATIC FUNCTION PANEL     Status: Abnormal   Collection Time    01/18/14  2:39 AM      Result Value Ref Range   Total Protein 5.6 (*) 6.0 - 8.3 g/dL   Albumin 2.7 (*) 3.5 - 5.2 g/dL   AST 35  0 - 37 U/L   ALT 10  0 - 35 U/L   Alkaline Phosphatase 63  39 - 117 U/L   Total Bilirubin 0.4  0.3 - 1.2 mg/dL   Bilirubin, Direct <0.2  0.0 - 0.3 mg/dL   Indirect Bilirubin NOT CALCULATED  0.3 - 0.9 mg/dL  GLUCOSE, CAPILLARY     Status: Abnormal   Collection Time    01/18/14  4:29 AM      Result Value Ref Range   Glucose-Capillary 116 (*) 70 - 99 mg/dL   Comment 1 Notify RN    GLUCOSE, CAPILLARY     Status: Abnormal   Collection Time    01/18/14  7:39 AM      Result Value Ref Range   Glucose-Capillary 121 (*) 70 - 99 mg/dL  GLUCOSE, CAPILLARY     Status: Abnormal   Collection Time    01/18/14 11:31 AM      Result Value Ref Range   Glucose-Capillary 138 (*) 70 - 99 mg/dL  URINE CULTURE     Status: None   Collection Time    01/18/14 12:36 PM      Result Value Ref Range   Specimen Description URINE, CATHETERIZED     Special Requests NONE     Culture  Setup Time       Value: 01/18/2014 19:21     Performed at [facility]       Value: >=100,000 COLONIES/ML     Performed at [facility]   Culture       Value: [place]     Performed at [facility]   Report Status PENDING    CK     Status: Abnormal   Collection Time    01/18/14  3:14 PM      Result Value Ref Range   Total CK 1263 (*) 7 - 177 U/L  LACTIC ACID, PLASMA     Status: None   Collection Time    01/18/14  3:14 PM      Result Value Ref Range   Lactic Acid, Venous 0.9  0.5 - 2.2 mmol/L  CULTURE, BLOOD (ROUTINE X 2)     Status: None    Collection Time    01/18/14  3:14 PM      Result Value Ref Range   Specimen Description BLOOD LEFT ARM     Special Requests BOTTLES DRAWN AEROBIC ONLY 1 CC     Culture  Setup Time       Value: 01/18/2014 20:08     Performed  at [facility]   Culture       Value:        BLOOD CULTURE RECEIVED NO GROWTH TO DATE CULTURE WILL BE HELD FOR 5 DAYS BEFORE ISSUING A FINAL NEGATIVE REPORT     Performed at [facility]   Report Status PENDING    PROCALCITONIN     Status: None   Collection Time    01/18/14  3:14 PM      Result Value Ref Range   Procalcitonin 0.30     Comment:            Interpretation:     PCT (Procalcitonin) <= 0.5 ng/mL:     Systemic infection (sepsis) is not likely.     Local bacterial infection is possible.     (NOTE)             ICU PCT Algorithm               Non ICU PCT Algorithm        ----------------------------     ------------------------------             PCT < 0.25 ng/mL                 PCT < 0.1 ng/mL         Stopping of antibiotics            Stopping of antibiotics           strongly encouraged.               strongly encouraged.        ----------------------------     ------------------------------           PCT level decrease by               PCT < 0.25 ng/mL           >= 80% from peak PCT           OR PCT 0.25 - 0.5 ng/mL          Stopping of antibiotics                                                 encouraged.         Stopping of antibiotics               encouraged.        ----------------------------     ------------------------------           PCT level decrease by              PCT >= 0.25 ng/mL           < 80% from peak PCT            AND PCT >= 0.5 ng/mL            Continuing antibiotics                                                  encouraged.           Continuing antibiotics  encouraged.        ----------------------------     ------------------------------         PCT level increase compared          PCT >  0.5 ng/mL             with peak PCT AND              PCT >= 0.5 ng/mL             Escalation of antibiotics                                              strongly encouraged.          Escalation of antibiotics            strongly encouraged.  GLUCOSE, CAPILLARY     Status: Abnormal   Collection Time    01/18/14  3:46 PM      Result Value Ref Range   Glucose-Capillary 105 (*) 70 - 99 mg/dL  GLUCOSE, CAPILLARY     Status: None   Collection Time    01/18/14  8:28 PM      Result Value Ref Range   Glucose-Capillary 77  70 - 99 mg/dL  CULTURE, RESPIRATORY (NON-EXPECTORATED)     Status: None   Collection Time    01/18/14  8:50 PM      Result Value Ref Range   Specimen Description TRACHEAL ASPIRATE     Special Requests NONE     Gram Stain       Value: MODERATE WBC PRESENT, PREDOMINANTLY PMN     FEW SQUAMOUS EPITHELIAL CELLS PRESENT     NO ORGANISMS SEEN     Performed at [facility]   Culture       Value: NO GROWTH     Performed at [facility]   Report Status PENDING    GLUCOSE, CAPILLARY     Status: Abnormal   Collection Time    01/19/14 12:01 AM      Result Value Ref Range   Glucose-Capillary 108 (*) 70 - 99 mg/dL  BASIC METABOLIC PANEL     Status: Abnormal   Collection Time    01/19/14  8:40 AM      Result Value Ref Range   Sodium 146  137 - 147 mEq/L   Comment: DELTA CHECK NOTED   Potassium 3.2 (*) 3.7 - 5.3 mEq/L   Chloride 106  96 - 112 mEq/L   CO2 23  19 - 32 mEq/L   Glucose, Bld 88  70 - 99 mg/dL   BUN <3 (*) 6 - 23 mg/dL   Creatinine, Ser 0.69  0.50 - 1.10 mg/dL   Calcium 9.4  8.4 - 10.5 mg/dL   GFR calc non Af Amer >90  >90 mL/min   GFR calc Af Amer >90  >90 mL/min   Comment: (NOTE)     The eGFR has been calculated using the CKD EPI equation.     This calculation has not been validated in all clinical situations.     eGFR's persistently <90 mL/min signify possible Chronic Kidney     Disease.  CBC     Status: Abnormal   Collection Time     01/19/14  8:40 AM      Result Value Ref Range   WBC 11.0 (*)  4.0 - 10.5 K/uL   RBC 4.94  3.87 - 5.11 MIL/uL   Hemoglobin 14.1  12.0 - 15.0 g/dL   HCT 41.6  36.0 - 46.0 %   MCV 84.2  78.0 - 100.0 fL   MCH 28.5  26.0 - 34.0 pg   MCHC 33.9  30.0 - 36.0 g/dL   RDW 13.1  11.5 - 15.5 %   Platelets 275  150 - 400 K/uL  MAGNESIUM     Status: None   Collection Time    01/19/14  8:40 AM      Result Value Ref Range   Magnesium 2.0  1.5 - 2.5 mg/dL  PHOSPHORUS     Status: Abnormal   Collection Time    01/19/14  8:40 AM      Result Value Ref Range   Phosphorus 2.0 (*) 2.3 - 4.6 mg/dL  GLUCOSE, CAPILLARY     Status: Abnormal   Collection Time    01/19/14 11:00 AM      Result Value Ref Range   Glucose-Capillary 106 (*) 70 - 99 mg/dL   Comment 1 Documented in Chart     Comment 2 Notify RN      Dg Chest Port 1 View  01/18/2014   CLINICAL DATA:  Intubation  EXAM: PORTABLE CHEST - 1 VIEW  COMPARISON:  01/17/2014  FINDINGS: Endotracheal tube is at the carina directed to the right mainstem. This could be retracted 3 cm. NG tube in the stomach. Normal heart size and vascularity. Visualized lungs remain clear. No effusion pneumothorax. No collapse or consolidation.  IMPRESSION: Low endotracheal tube at the carina directed to the right mainstem, recommend retraction 3 cm.  Lungs remain clear  These results were called by telephone at the time of interpretation on 01/18/2014 at 9:03 AM to [facility], [place], who verbally acknowledged these results.   Electronically Signed   By: Daryll Brod M.D.   On: 01/18/2014 09:05    Positive for anxiety, bad mood and illegal drug usage Blood pressure 120/83, pulse 58, temperature 98 F (36.7 C), temperature source Oral, resp. rate 18, height '5\' 5"'  (1.651 m), weight 60.328 kg (133 lb), SpO2 100.00%, unknown if currently breastfeeding.   Assessment/Plan: Polysubstance abuse (Cocaine, Benzo's and cannabis)  Recommendation: Patient does not meet criteria for acute  psychiatric hospital as she does not have any concerns at this time Referred to out patient mental health at [facility] for substance abuse counseling Recommend no psychotropic medication Appreciate psych consult and will sign off at this time  Maddie Brazier,JANARDHAHA R. 01/19/2014, 4:34 PM

## 2014-01-19 NOTE — Clinical Social Work Psych Note (Signed)
Psych CSW called [facility] to confirm that pt was on psychiatrist consult log.  Vickii PennaGina Athziry Millican, LCSWA [phone]  Clinical Social Work

## 2014-01-19 NOTE — Clinical Documentation Improvement (Signed)
Possible Clinical Conditions?   Encephalopathy (describe type if known)                       Anoxic                       Septic                       Alcoholic                        Hepatic                       Hypertensive                       Metabolic                       Toxic Drug induced delirium  Acute Confusion Acute Delirium Other Condition Cannot Clinically Determine    Risk Factors: AMS, suspect secondary to combination of cocaine, THC, and phenergan; unresponsive on arrival, noted per 4/04 progress notes.   Thank You, Marciano SequinWanda Mathews-Bethea,RN,BSN, Clinical Documentation Specialist:  [phone]  [facility]- Health Information Management ________________________________________________________________________________________________Documentation Clarification #2:   Possible Clinical Conditions?  Aspiration Pneumonia Community Acquired Pneumonia Other Condition Cannot Clinically Determine   Risk Factors: Pharmacy consult for Aspiration Pneumonia, Unasyn started per 4/05 pharmacy note.   Thank You, Marciano SequinWanda Mathews-Bethea,RN,BSN, Clinical Documentation Specialist

## 2014-01-19 NOTE — Progress Notes (Signed)
PULMONARY / CRITICAL CARE MEDICINE   Name: Patricia Dunn MRN: [id number] DOB: [birth year]    ADMISSION DATE:  01/16/2014 CONSULTATION DATE:  4/4 PRIMARY SERVICE: PCCM  CHIEF COMPLAINT:  AMS  BRIEF PATIENT DESCRIPTION: [age] F known PSA history presenting with AMS in setting of reported phenergan injection and UDS positive for cocaine, BZD, and THC. Intubated for airway protection. Pt states she did not intentionally commit suicide    SIGNIFICANT EVENTS / STUDIES:   4/3-UDS (+) Cocaine,THC, and BZD  4/3- XCR no acute infiltrate or pulmonary edema. Mild left basilar atelectasis 4/5-CXR no acute infiltrate or pulmonary edema  LINES / TUBES: ETT 4/3>>4/4  CULTURES: 4/3- Blood Cultures 4/3>> 4/3 Urine Culture 4/3>> 4/3 Sputum Culture 4/3>> 01/18/14 HIV >>neg .Marland Kitchen....   ANTIBIOTICS: Unasyn 01/18/14>>4/6    SUBJECTIVE:  Pt alert on floor. Wants to go home.  VITAL SIGNS: Temp:  [98.8 F (37.1 C)-102.3 F (39.1 C)] 98.8 F (37.1 C) (04/06 0500) Pulse Rate:  [46-95] 95 (04/06 0500) Resp:  [16-27] 18 (04/06 0500) BP: (73-140)/(45-95) 140/95 mmHg (04/06 0500) SpO2:  [99 %-100 %] 100 % (04/06 0500) Weight:  [60.328 kg (133 lb)] 60.328 kg (133 lb) (04/06 0153)   INTAKE / OUTPUT: Intake/Output     04/05 0701 - 04/06 0700 04/06 0701 - 04/07 0700   P.O. 400    I.V. (mL/kg) 1860.1 (30.8)    IV Piggyback 300    Total Intake(mL/kg) 2560.1 (42.4)    Urine (mL/kg/hr) 2460 (1.7)    Total Output 2460     Net +100.1          Urine Occurrence 1 x    Stool Occurrence 1 x      PHYSICAL EXAMINATION: General:  Well built Neuro: alert and Ox3  HEENT: Miosis (2 mm) bilaterally,  MMM,  Cardiovascular: RRR, NS1/S2, (-) MRG  Lungs: CTAB  Abdomen: S/NT/ND/(+)BS  Musculoskeletal: (-) C/C/E  Skin: Intact  LABS:  CBC  Recent Labs Lab 01/16/14 2137 01/17/14 0320 01/18/14 0239  WBC 7.7 8.3 12.9*  HGB 14.6 12.0 12.1  HCT 42.9 35.4* 37.2  PLT 268 261 214   Coag's  Recent  Labs Lab 01/17/14 0047  APTT 29  INR 1.00   BMET  Recent Labs Lab 01/16/14 2137 01/17/14 0742 01/18/14 0239  NA 145 143 139  K 3.4* 3.8 3.8  CL 104 108 106  CO2 22 21 19   BUN 9 6 5*  CREATININE 0.82 0.64 0.74  GLUCOSE 99 101* 134*   Electrolytes  Recent Labs Lab 01/16/14 2137 01/17/14 0047 01/17/14 0742 01/18/14 0239  CALCIUM 9.4  --  8.3* 8.0*  MG  --  1.8  --  1.9  PHOS  --  2.7  --  2.1*   Sepsis Markers  Recent Labs Lab 01/17/14 0047 01/18/14 0239 01/18/14 1514  LATICACIDVEN 1.6 0.9 0.9  PROCALCITON  --   --  0.30   ABG  Recent Labs Lab 01/17/14 0218  PHART 7.416  PCO2ART 34.6*  PO2ART 150.0*   Liver Enzymes  Recent Labs Lab 01/16/14 2137 01/18/14 0239  AST 17 35  ALT 13 10  ALKPHOS 74 63  BILITOT 0.4 0.4  ALBUMIN 4.2 2.7*   Cardiac Enzymes  Recent Labs Lab 01/17/14 0047 01/17/14 0320 01/17/14 1242  TROPONINI <0.30 <0.30 <0.30  PROBNP 10.9  --   --    Glucose  Recent Labs Lab 01/18/14 0429 01/18/14 0739 01/18/14 1131 01/18/14 1546 01/18/14 2028 01/19/14 0001  GLUCAP 116*  121* 138* 105* 77 108*    Imaging Dg Chest Port 1 View  01/18/2014   CLINICAL DATA:  Intubation  EXAM: PORTABLE CHEST - 1 VIEW  COMPARISON:  01/17/2014  FINDINGS: Endotracheal tube is at the carina directed to the right mainstem. This could be retracted 3 cm. NG tube in the stomach. Normal heart size and vascularity. Visualized lungs remain clear. No effusion pneumothorax. No collapse or consolidation.  IMPRESSION: Low endotracheal tube at the carina directed to the right mainstem, recommend retraction 3 cm.  Lungs remain clear  These results were called by telephone at the time of interpretation on 01/18/2014 at 9:03 AM to [facility], RN, who verbally acknowledged these results.   Electronically Signed   By: Ruel Favors M.D.   On: 01/18/2014 09:05    ASSESSMENT / PLAN:   PULMONARY A:   - Resp Failure 2/2 AMS. >>resolved - P:   Off  oxygen   CARDIOVASCULAR A: no issues  RENAL A:  - - HpoMg resolved - no issues P:    GASTROINTESTINAL A:  No Acute Issue P: -Pepcid 20 mg IV bid >>d/c  HEMATOLOGIC A:  No acute Issue  INFECTIOUS A:  fever with T 102, unclear etiology. CXR negative. Culture of urine, resp and Urine pending. 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  P (staff MD note) HIV neg No overt infection Fever resolved Off abx 4/6  ENDOCRINE A:  No acute issue    NEUROLOGIC A:   - AMS: Suspect 2/2 to combination of cocaine, THC and phenrgen (BZD likely 2/2 administration by EMS). No clear evidence of suicidal  Monitor Psych eval then d/c later this PM if cleared by psych  Dorcas Carrow Beeper  [phone]  Cell  [phone]  If no response or cell goes to voicemail, call beeper [phone]   01/19/2014 9:51 AM
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
## 2014-01-19 NOTE — Progress Notes (Signed)
Pt was extremely anxious and combative at change of shift. Claimed that the staff in ICU and on the floor were trying to kill her and were ignoring her. Pt screaming at staff while screaming at family members on the phone. Claimed she had not used drugs and everyone was lying about her. ICU MD notified and orders received for xanax. NT and RN worked with patient at length to calm her down. She was able to talk calmly after an hour, was able to take a shower with assistance, was calmly asking questions about her condition, and was able to void with some dysuria which improved throughout the evening. Orders received to start a regular diet and pt has been eating food without difficulty. She continues to be upset with family members and is on the phone constantly yelling at various people but is mostly polite and respectful to staff.

## 2014-01-19 NOTE — Progress Notes (Signed)
Psych MD cleared Pt for D/C. Pt states she is not waiting for D/C papers. Notified Dr Delford FieldWright of Pts agitation and verbal abuse including cursing about how she had waited all day long for a MD. AMA paper signed and Pt left the floor at this time. Notified Dr Delford FieldWright of Pt leaving AMA.

## 2014-01-20 LAB — URINE CULTURE: Colony Count: >=100000

## 2014-01-21 LAB — CULTURE, RESPIRATORY

## 2014-01-21 LAB — CULTURE, RESPIRATORY W GRAM STAIN

## 2014-01-23 LAB — CULTURE, BLOOD (ROUTINE X 2)
CULTURE: NO GROWTH
CULTURE: NO GROWTH

## 2014-01-24 LAB — CULTURE, BLOOD (ROUTINE X 2): Culture: NO GROWTH

## 2014-01-24 NOTE — ED Provider Notes (Signed)
I saw and evaluated the patient, reviewed the resident's note and I agree with the findings and plan.   .Face to face Exam:  General:  Obtunded HEENT:  Atraumatic Resp:  Normal effort Abd:  Nondistended   After patient arrived emerged apartment she was given Romazicon in order to attempt to reverse the overdose.  Patient had vomited prior to arrival in emergency department and she began wheezing and became more combative.  At this time emergent intubation was undertaken.  INTUBATION Performed by: Nelia Shiobert L Orpah Hausner  Required items: required blood products, implants, devices, and special equipment available Patient identity confirmed: provided demographic data and hospital-assigned identification number Time out: Immediately prior to procedure a "time out" was called to verify the correct patient, procedure, equipment, support staff and site/side marked as required.  Indications: Decreased level of consciousness this with possible aspiration.    Intubation method: Glidescope Laryngoscopy   Preoxygenation: BVM  Sedatives: Etomidate Paralytic: Rocuronium  Tube Size: 7.5 cuffed  Post-procedure assessment: chest rise and ETCO2 monitor Breath sounds: equal and absent over the epigastrium Tube secured with: ETT holder Chest x-ray interpreted by radiologist and me.  Chest x-ray findings: endotracheal tube in appropriate position  Patient tolerated the procedure well with no immediate complications.   CRITICAL CARE Performed by: Nelia Shiobert L Khamryn Calderone Total critical care time: 45 min Critical care time was exclusive of separately billable procedures and treating other patients. Critical care was necessary to treat or prevent imminent or life-threatening deterioration. Critical care was time spent personally by me on the following activities: development of treatment plan with patient and/or surrogate as well as nursing, discussions with consultants, evaluation of patient's response to  treatment, examination of patient, obtaining history from patient or surrogate, ordering and performing treatments and interventions, ordering and review of laboratory studies, ordering and review of radiographic studies, pulse oximetry and re-evaluation of patient's condition.       Nelia Shiobert L Fuller Makin, MD 01/24/14 1535

## 2014-01-25 NOTE — Discharge Summary (Signed)
Reviewed.   Zoha Spranger, MD Pulmonary and Critical Care Medicine [facility] Pager: [phone]    

## 2014-08-17 ENCOUNTER — Encounter (HOSPITAL_COMMUNITY): Payer: Self-pay | Admitting: Orthopedic Surgery

## 2014-09-23 ENCOUNTER — Emergency Department (HOSPITAL_COMMUNITY)
Admission: EM | Admit: 2014-09-23 | Discharge: 2014-09-23 | Disposition: A | Discharge status: Home / Self Care | Payer: Medicaid Other | Attending: Emergency Medicine | Admitting: Emergency Medicine

## 2014-09-23 ENCOUNTER — Encounter (HOSPITAL_COMMUNITY): Payer: Self-pay | Admitting: *Deleted

## 2014-09-23 DIAGNOSIS — R05 Cough: Secondary | ICD-10-CM | POA: Diagnosis present

## 2014-09-23 DIAGNOSIS — J45901 Unspecified asthma with (acute) exacerbation: Secondary | ICD-10-CM

## 2014-09-23 DIAGNOSIS — Z720 Tobacco use: Secondary | ICD-10-CM | POA: Diagnosis not present

## 2014-09-23 DIAGNOSIS — J45909 Unspecified asthma, uncomplicated: Secondary | ICD-10-CM | POA: Diagnosis not present

## 2014-09-23 DIAGNOSIS — Z79899 Other long term (current) drug therapy: Secondary | ICD-10-CM | POA: Diagnosis not present

## 2014-09-23 DIAGNOSIS — K088 Other specified disorders of teeth and supporting structures: Secondary | ICD-10-CM | POA: Insufficient documentation

## 2014-09-23 DIAGNOSIS — K0889 Other specified disorders of teeth and supporting structures: Secondary | ICD-10-CM

## 2014-09-23 MED ORDER — ONDANSETRON 4 MG PO TBDP
4.0000 mg | ORAL_TABLET | Freq: Once | ORAL | Status: AC
  Administered 2014-09-23: 4 mg via ORAL
  Filled 2014-09-23: qty 1

## 2014-09-23 MED ORDER — AMOXICILLIN 500 MG PO CAPS: 500.0000 mg | ORAL_CAPSULE | Freq: Three times a day (TID) | ORAL | Status: DC

## 2014-09-23 MED ORDER — ALBUTEROL SULFATE HFA 108 (90 BASE) MCG/ACT IN AERS
2.0000 | INHALATION_SPRAY | RESPIRATORY_TRACT | Status: DC
  Administered 2014-09-23: 2 via RESPIRATORY_TRACT
  Filled 2014-09-23: qty 6.7

## 2014-09-23 MED ORDER — HYDROCODONE-ACETAMINOPHEN 5-325 MG PO TABS: 2.0000 | ORAL_TABLET | ORAL | Status: DC | PRN

## 2014-09-23 MED ORDER — ALBUTEROL SULFATE (2.5 MG/3ML) 0.083% IN NEBU
2.5000 mg | INHALATION_SOLUTION | Freq: Once | RESPIRATORY_TRACT | Status: AC
  Administered 2014-09-23: 2.5 mg via RESPIRATORY_TRACT
  Filled 2014-09-23: qty 3

## 2014-09-23 NOTE — ED Notes (Signed)
Pt states she cannot urinate at this time and refuses an in and out cath

## 2014-09-23 NOTE — ED Notes (Signed)
Pt reports n/v/d, bodyaches, chills x 3 days.

## 2014-09-23 NOTE — ED Provider Notes (Signed)
CSN: [id number]     Arrival date & time 09/23/14  0940 History   First MD Initiated Contact with Patient 09/23/14 1104     Chief Complaint  Patient presents with  . Cough  . Influenza     (Consider location/radiation/quality/duration/timing/severity/associated sxs/prior Treatment) Patient is a 28 y.o. female presenting with cough and flu symptoms. The history is provided by the patient. No language interpreter was used.  Cough Cough characteristics:  Productive Sputum characteristics:  Nondescript Severity:  Moderate Onset quality:  Gradual Timing:  Constant Progression:  Worsening Chronicity:  New Smoker: no   Relieved by:  Nothing Worsened by:  Nothing tried Ineffective treatments:  None tried Associated symptoms: fever   Influenza Presenting symptoms: cough and fever     Past Medical History  Diagnosis Date  . Asthma   . Headache(784.0)     migraines  . Smoker    Past Surgical History  Procedure Laterality Date  . Vaginal delivery      X 4  . No past surgeries    . Tubal ligation  07/09/2012    Procedure: POST PARTUM TUBAL LIGATION;  Surgeon: Catalina AntiguaPeggy Constant, MD;  Location: [facility];  Service: Gynecology;  Laterality: Bilateral;   Family History  Problem Relation Age of Onset  . Other Neg Hx    History  Substance Use Topics  . Smoking status: Current Every Day Smoker -- 1.00 packs/day    Types: Cigarettes  . Smokeless tobacco: Never Used  . Alcohol Use: Yes     Comment: Beginning of pregnancy   OB History    Gravida Para Term Preterm AB TAB SAB Ectopic Multiple Living   7 5 5  0 2 0 2 0 0 5     Review of Systems  Constitutional: Positive for fever.  Respiratory: Positive for cough.   All other systems reviewed and are negative.     Allergies  Review of patient's allergies indicates no known allergies.  Home Medications   Prior to Admission medications   Medication Sig Start Date End Date Taking? Authorizing Provider  albuterol (PROVENTIL  HFA;VENTOLIN HFA) 108 (90 BASE) MCG/ACT inhaler Inhale 1-2 puffs into the lungs every 6 (six) hours as needed for wheezing or shortness of breath.   Yes Historical Provider, MD  OVER THE COUNTER MEDICATION Take 30 mLs by mouth every 6 (six) hours as needed (Cold/flu symptoms). Cold/flu syrup   Yes Historical Provider, MD   BP 112/63 mmHg  Pulse 70  Temp(Src) 98.4 F (36.9 C) (Oral)  Resp 18  SpO2 100% Physical Exam  Constitutional: She is oriented to person, place, and time. She appears well-developed and well-nourished.  HENT:  Head: Normocephalic.  Right Ear: External ear normal.  Left Ear: External ear normal.  Nose: Nose normal.  Mouth/Throat: Oropharynx is clear and moist.  Eyes: Conjunctivae and EOM are normal. Pupils are equal, round, and reactive to light.  Neck: Normal range of motion.  Cardiovascular: Normal rate and normal heart sounds.   Pulmonary/Chest: Effort normal. She has wheezes.  Abdominal: She exhibits no distension.  Musculoskeletal: Normal range of motion.  Neurological: She is alert and oriented to person, place, and time.  Psychiatric: She has a normal mood and affect.  Nursing note and vitals reviewed.   ED Course  Procedures (including critical care time) Labs Review Labs Reviewed  URINALYSIS, ROUTINE W REFLEX MICROSCOPIC    Imaging Review No results found.   EKG Interpretation None      MDM  Final diagnoses:  Asthma attack  Toothache    amoxicillian Hydrocodone Albuterol inhaler given here    Elson AreasLeslie K Kairon Shock, PA-C 09/23/14 1437  Vanetta MuldersScott Zackowski, MD 09/24/14 [phone]

## 2014-09-23 NOTE — Discharge Instructions (Signed)
Asthma °Asthma is a recurring condition in which the airways tighten and narrow. Asthma can make it difficult to breathe. It can cause coughing, wheezing, and shortness of breath. Asthma episodes, also called asthma attacks, range from minor to life-threatening. Asthma cannot be cured, but medicines and lifestyle changes can help control it. °CAUSES °Asthma is believed to be caused by inherited (genetic) and environmental factors, but its exact cause is unknown. Asthma may be triggered by allergens, lung infections, or irritants in the air. Asthma triggers are different for each person. Common triggers include:  °· Animal dander. °· Dust mites. °· Cockroaches. °· Pollen from trees or grass. °· Mold. °· Smoke. °· Air pollutants such as dust, household cleaners, hair sprays, aerosol sprays, paint fumes, strong chemicals, or strong odors. °· Cold air, weather changes, and winds (which increase molds and pollens in the air). °· Strong emotional expressions such as crying or laughing hard. °· Stress. °· Certain medicines (such as aspirin) or types of drugs (such as beta-blockers). °· Sulfites in foods and drinks. Foods and drinks that may contain sulfites include dried fruit, potato chips, and sparkling grape juice. °· Infections or inflammatory conditions such as the flu, a cold, or an inflammation of the nasal membranes (rhinitis). °· Gastroesophageal reflux disease (GERD). °· Exercise or strenuous activity. °SYMPTOMS °Symptoms may occur immediately after asthma is triggered or many hours later. Symptoms include: °· Wheezing. °· Excessive nighttime or early morning coughing. °· Frequent or severe coughing with a common cold. °· Chest tightness. °· Shortness of breath. °DIAGNOSIS  °The diagnosis of asthma is made by a review of your medical history and a physical exam. Tests may also be performed. These may include: °· Lung function studies. These tests show how much air you breathe in and out. °· Allergy  tests. °· Imaging tests such as X-rays. °TREATMENT  °Asthma cannot be cured, but it can usually be controlled. Treatment involves identifying and avoiding your asthma triggers. It also involves medicines. There are 2 classes of medicine used for asthma treatment:  °· Controller medicines. These prevent asthma symptoms from occurring. They are usually taken every day. °· Reliever or rescue medicines. These quickly relieve asthma symptoms. They are used as needed and provide short-term relief. °Your health care provider will help you create an asthma action plan. An asthma action plan is a written plan for managing and treating your asthma attacks. It includes a list of your asthma triggers and how they may be avoided. It also includes information on when medicines should be taken and when their dosage should be changed. An action plan may also involve the use of a device called a peak flow meter. A peak flow meter measures how well the lungs are working. It helps you monitor your condition. °HOME CARE INSTRUCTIONS  °· Take medicines only as directed by your health care provider. Speak with your health care provider if you have questions about how or when to take the medicines. °· Use a peak flow meter as directed by your health care provider. Record and keep track of readings. °· Understand and use the action plan to help minimize or stop an asthma attack without needing to seek medical care. °· Control your home environment in the following ways to help prevent asthma attacks: °¨ Do not smoke. Avoid being exposed to secondhand smoke. °¨ Change your heating and air conditioning filter regularly. °¨ Limit your use of fireplaces and wood stoves. °¨ Get rid of pests (such as roaches and   mice) and their droppings.  Throw away plants if you see mold on them.  Clean your floors and dust regularly. Use unscented cleaning products.  Try to have someone else vacuum for you regularly. Stay out of rooms while they are  being vacuumed and for a short while afterward. If you vacuum, use a dust mask from a hardware store, a double-layered or microfilter vacuum cleaner bag, or a vacuum cleaner with a HEPA filter.  Replace carpet with wood, tile, or vinyl flooring. Carpet can trap dander and dust.  Use allergy-proof pillows, mattress covers, and box spring covers.  Wash bed sheets and blankets every week in hot water and dry them in a dryer.  Use blankets that are made of polyester or cotton.  Clean bathrooms and kitchens with bleach. If possible, have someone repaint the walls in these rooms with mold-resistant paint. Keep out of the rooms that are being cleaned and painted.  Wash hands frequently. SEEK MEDICAL CARE IF:   You have wheezing, shortness of breath, or a cough even if taking medicine to prevent attacks.  The colored mucus you cough up (sputum) is thicker than usual.  Your sputum changes from clear or white to yellow, green, gray, or bloody.  You have any problems that may be related to the medicines you are taking (such as a rash, itching, swelling, or trouble breathing).  You are using a reliever medicine more than 2-3 times per week.  Your peak flow is still at 50-79% of your personal best after following your action plan for 1 hour.  You have a fever. SEEK IMMEDIATE MEDICAL CARE IF:   You seem to be getting worse and are unresponsive to treatment during an asthma attack.  You are short of breath even at rest.  You get short of breath when doing very little physical activity.  You have difficulty eating, drinking, or talking due to asthma symptoms.  You develop chest pain.  You develop a fast heartbeat.  You have a bluish color to your lips or fingernails.  You are light-headed, dizzy, or faint.  Your peak flow is less than 50% of your personal best. MAKE SURE YOU:   Understand these instructions.  Will watch your condition.  Will get help right away if you are not  doing well or get worse. Document Released: 10/02/2005 Document Revised: 02/16/2014 Document Reviewed: 05/01/2013 [facility] Patient Information 2015 [employer] [place] [place]. This information is not intended to replace advice given to you by your health care provider. Make sure you discuss any questions you have with your health care provider. Dental Pain A tooth ache may be caused by cavities (tooth decay). Cavities expose the nerve of the tooth to air and hot or cold temperatures. It may come from an infection or abscess (also called a boil or furuncle) around your tooth. It is also often caused by dental caries (tooth decay). This causes the pain you are having. DIAGNOSIS  Your caregiver can diagnose this problem by exam. TREATMENT   If caused by an infection, it may be treated with medications which kill germs (antibiotics) and pain medications as prescribed by your caregiver. Take medications as directed.  Only take over-the-counter or prescription medicines for pain, discomfort, or fever as directed by your caregiver.  Whether the tooth ache today is caused by infection or dental disease, you should see your dentist as soon as possible for further care. SEEK MEDICAL CARE IF: The exam and treatment you received today has been provided on  an emergency basis only. This is not a substitute for complete medical or dental care. If your problem worsens or new problems (symptoms) appear, and you are unable to meet with your dentist, call or return to this location. SEEK IMMEDIATE MEDICAL CARE IF:   You have a fever.  You develop redness and swelling of your face, jaw, or neck.  You are unable to open your mouth.  You have severe pain uncontrolled by pain medicine. MAKE SURE YOU:   Understand these instructions.  Will watch your condition.  Will get help right away if you are not doing well or get worse. Document Released: 10/02/2005 Document Revised: 12/25/2011 Document Reviewed:  05/20/2008 [facility] Patient Information 2015 [place], [place]. This information is not intended to replace advice given to you by your health care provider. Make sure you discuss any questions you have with your health care provider.

## 2014-10-02 ENCOUNTER — Emergency Department (HOSPITAL_COMMUNITY)
Admission: EM | Admit: 2014-10-02 | Discharge: 2014-10-02 | Disposition: A | Discharge status: Home / Self Care | Payer: Medicaid Other | Attending: Emergency Medicine | Admitting: Emergency Medicine

## 2014-10-02 ENCOUNTER — Encounter (HOSPITAL_COMMUNITY): Payer: Self-pay | Admitting: *Deleted

## 2014-10-02 DIAGNOSIS — Z720 Tobacco use: Secondary | ICD-10-CM | POA: Insufficient documentation

## 2014-10-02 DIAGNOSIS — Z79899 Other long term (current) drug therapy: Secondary | ICD-10-CM | POA: Insufficient documentation

## 2014-10-02 DIAGNOSIS — J45901 Unspecified asthma with (acute) exacerbation: Secondary | ICD-10-CM

## 2014-10-02 DIAGNOSIS — Z8679 Personal history of other diseases of the circulatory system: Secondary | ICD-10-CM | POA: Insufficient documentation

## 2014-10-02 DIAGNOSIS — Z792 Long term (current) use of antibiotics: Secondary | ICD-10-CM | POA: Insufficient documentation

## 2014-10-02 DIAGNOSIS — J45909 Unspecified asthma, uncomplicated: Secondary | ICD-10-CM | POA: Diagnosis present

## 2014-10-02 DIAGNOSIS — K029 Dental caries, unspecified: Secondary | ICD-10-CM | POA: Diagnosis not present

## 2014-10-02 DIAGNOSIS — R0789 Other chest pain: Secondary | ICD-10-CM | POA: Diagnosis not present

## 2014-10-02 MED ORDER — IBUPROFEN 400 MG PO TABS
800.0000 mg | ORAL_TABLET | Freq: Once | ORAL | Status: AC
  Administered 2014-10-02: 800 mg via ORAL
  Filled 2014-10-02: qty 4

## 2014-10-02 MED ORDER — IBUPROFEN 800 MG PO TABS: 800.0000 mg | ORAL_TABLET | Freq: Three times a day (TID) | ORAL | Status: DC

## 2014-10-02 MED ORDER — ALBUTEROL SULFATE (2.5 MG/3ML) 0.083% IN NEBU
5.0000 mg | INHALATION_SOLUTION | Freq: Once | RESPIRATORY_TRACT | Status: AC
  Administered 2014-10-02: 5 mg via RESPIRATORY_TRACT
  Filled 2014-10-02: qty 6

## 2014-10-02 MED ORDER — NICOTINE 21 MG/24HR TD PT24
21.0000 mg | MEDICATED_PATCH | Freq: Every day | TRANSDERMAL | Status: DC
  Administered 2014-10-02: 21 mg via TRANSDERMAL
  Filled 2014-10-02: qty 1

## 2014-10-02 MED ORDER — PREDNISONE 20 MG PO TABS: 40.0000 mg | ORAL_TABLET | Freq: Every day | ORAL | Status: DC

## 2014-10-02 MED ORDER — PREDNISONE 20 MG PO TABS
60.0000 mg | ORAL_TABLET | Freq: Once | ORAL | Status: AC
  Administered 2014-10-02: 60 mg via ORAL
  Filled 2014-10-02: qty 3

## 2014-10-02 NOTE — ED Provider Notes (Signed)
CSN: [id number]     Arrival date & time 10/02/14  1701 History  This chart was scribed for non-physician practitioner, Dierdre ForthHannah Ramiro Pangilinan, PA-C, working with Suzi RootsKevin E Steinl, MD, by Bronson CurbJacqueline Melvin, ED Scribe. This patient was seen in room TR08C/TR08C and the patient's care was started at 5:38 PM.   Chief Complaint  Patient presents with  . Asthma    The history is provided by the patient and medical records. No language interpreter was used.     HPI Comments: Patricia ArisLabrisha C Oswald is a 28 y.o. female who presents to the Emergency Department complaining of worsening asthma for the past 9 days. Patient also reports dental pain.  Patient was seen here 9 days ago for the same, in addition to dental pain. There is associated cough, chills, and chest tightness. She also reports chest pain, only with cough, that radiates to the middle of her back. Patient works in a cold environment and suspects this may be related. Patient states that she was not given anything for her asthma, but was given amoxicillin and pain medication for her dental pain. She denies nausea or vomiting. Patient is a current smoker, with history of asthma and headache. Patient is also requesting a prescriptoin for nicotine patches, stating that she received one during her last visit which helped her smoking habit.   Past Medical History  Diagnosis Date  . Asthma   . Headache(784.0)     migraines  . Smoker    Past Surgical History  Procedure Laterality Date  . Vaginal delivery      X 4  . No past surgeries    . Tubal ligation  07/09/2012    Procedure: POST PARTUM TUBAL LIGATION;  Surgeon: Catalina AntiguaPeggy Constant, MD;  Location: [facility];  Service: Gynecology;  Laterality: Bilateral;   Family History  Problem Relation Age of Onset  . Other Neg Hx    History  Substance Use Topics  . Smoking status: Current Every Day Smoker -- 1.00 packs/day    Types: Cigarettes  . Smokeless tobacco: Never Used  . Alcohol Use: Yes     Comment:  Beginning of pregnancy   OB History    Gravida Para Term Preterm AB TAB SAB Ectopic Multiple Living   7 5 5  0 2 0 2 0 0 5     Review of Systems  Constitutional: Negative for fever, chills, appetite change and fatigue.  HENT: Positive for congestion, dental problem, postnasal drip, rhinorrhea and sinus pressure. Negative for ear discharge, ear pain, mouth sores and sore throat.   Eyes: Negative for visual disturbance.  Respiratory: Positive for cough and chest tightness. Negative for shortness of breath, wheezing and stridor.   Cardiovascular: Positive for chest pain (with cough only). Negative for palpitations and leg swelling.  Gastrointestinal: Negative for nausea, vomiting, abdominal pain and diarrhea.  Genitourinary: Negative for dysuria, urgency, frequency and hematuria.  Musculoskeletal: Negative for myalgias, arthralgias and neck stiffness.  Skin: Negative for rash.  Neurological: Negative for syncope, light-headedness, numbness and headaches.  Hematological: Negative for adenopathy.  Psychiatric/Behavioral: The patient is not nervous/anxious.   All other systems reviewed and are negative.     Allergies  Review of patient's allergies indicates no known allergies.  Home Medications   Prior to Admission medications   Medication Sig Start Date End Date Taking? Authorizing Provider  albuterol (PROVENTIL HFA;VENTOLIN HFA) 108 (90 BASE) MCG/ACT inhaler Inhale 1-2 puffs into the lungs every 6 (six) hours as needed for wheezing or shortness of  breath.    Historical Provider, MD  amoxicillin (AMOXIL) 500 MG capsule Take 1 capsule (500 mg total) by mouth 3 (three) times daily. 09/23/14   Elson AreasLeslie K Sofia, PA-C  HYDROcodone-acetaminophen (NORCO/VICODIN) 5-325 MG per tablet Take 2 tablets by mouth every 4 (four) hours as needed for moderate pain or severe pain. 09/23/14   Elson AreasLeslie K Sofia, PA-C  ibuprofen (ADVIL,MOTRIN) 800 MG tablet Take 1 tablet (800 mg total) by mouth 3 (three) times  daily. 10/02/14   Jayma Volpi, PA-C  OVER THE COUNTER MEDICATION Take 30 mLs by mouth every 6 (six) hours as needed (Cold/flu symptoms). Cold/flu syrup    Historical Provider, MD  predniSONE (DELTASONE) 20 MG tablet Take 2 tablets (40 mg total) by mouth daily. 10/02/14   Joseh Sjogren, PA-C   Triage Vitals: BP 119/64 mmHg  Pulse 74  Temp(Src) 98.3 F (36.8 C)  Resp 16  Ht 5\' 4"  (1.626 m)  Wt 125 lb (56.7 kg)  BMI 21.45 kg/m2  SpO2 98%  LMP 09/25/2014  Physical Exam  Constitutional: She is oriented to person, place, and time. She appears well-developed and well-nourished. No distress.  HENT:  Head: Normocephalic and atraumatic.  Right Ear: Tympanic membrane, external ear and ear canal normal.  Left Ear: Tympanic membrane, external ear and ear canal normal.  Nose: Mucosal edema and rhinorrhea present. No epistaxis. Right sinus exhibits no maxillary sinus tenderness and no frontal sinus tenderness. Left sinus exhibits no maxillary sinus tenderness and no frontal sinus tenderness.  Mouth/Throat: Uvula is midline, oropharynx is clear and moist and mucous membranes are normal. Mucous membranes are not pale and not cyanotic. No oral lesions. Abnormal dentition. Dental caries present. No uvula swelling or lacerations. No oropharyngeal exudate, posterior oropharyngeal edema, posterior oropharyngeal erythema or tonsillar abscesses.    No gingival swelling, fluctuance or induration No gross abscess Large dental cary at tooth #15 without gingival erythema, induration, edema or gross abscess  Eyes: Conjunctivae are normal. Pupils are equal, round, and reactive to light. Right eye exhibits no discharge. Left eye exhibits no discharge.  Neck: Normal range of motion and full passive range of motion without pain. Neck supple.  No stridor Handling secretions without difficulty No nuchal rigidity No cervical lymphadenopathy   Cardiovascular: Normal rate, regular rhythm, normal heart  sounds and intact distal pulses.   Pulmonary/Chest: Effort normal. No stridor. No respiratory distress. She has decreased breath sounds. She exhibits tenderness (pain with palpation to the anterior chest).  Diminished, but equal breath sounds without focal wheezes, rhonchi, rales  Abdominal: Soft. Bowel sounds are normal. She exhibits no distension. There is no tenderness.  Musculoskeletal: Normal range of motion.  Lymphadenopathy:    She has no cervical adenopathy.  Neurological: She is alert and oriented to person, place, and time.  Skin: Skin is warm and dry. No rash noted. She is not diaphoretic.  Psychiatric: She has a normal mood and affect.  Nursing note and vitals reviewed.   ED Course  Procedures (including critical care time)  DIAGNOSTIC STUDIES: Oxygen Saturation is 98% on room air, normal by my interpretation.    COORDINATION OF CARE: At 1743 Discussed treatment plan with patient which includes breathing treatment. Patient agrees.   Labs Review Labs Reviewed - No data to display  Imaging Review No results found.   EKG Interpretation None      MDM   Final diagnoses:  Asthma exacerbation  Pain due to dental caries  Chest wall pain   Patricia Dunn  presents with multiple complaints.  She continues to c/o her dental pain for which she was treated 9 days ago.  No gross abscess or signs of infection.  Exam unconcerning for Ludwig's angina or spread of infection.  Pt is to complete her Rx of penicillin.  Urged patient to follow-up with dentist.    Patient also with complaints of chest tightness, cough and "asthma exacerbation" due to a URI. This was also evaluated 9 days ago but she was not discharged home with any prednisone. Patient reports she does have her albuterol inhaler. Will give albuterol treatment and prednisone and reassess.  Asymmetric cough since onset of her URI with reproducible chest wall pain anteriorly. We'll discharge home with ibuprofen for  this.  6:36 PM Patient with improved breath sounds after completion of her albuterol inhaler. We'll discharge home.  Urine equal breath sounds without focal wheezes or rhonchi, afebrile and without hypoxia. Highly doubt pneumonia.  I have personally reviewed patient's vitals, nursing note and any pertinent labs or imaging.  I performed an focused physical exam; undressed when appropriate .    It has been determined that no acute conditions requiring further emergency intervention are present at this time. The patient/guardian have been advised of the diagnosis and plan. I reviewed any labs and imaging including any potential incidental findings. We have discussed signs and symptoms that warrant return to the ED and they are listed in the discharge instructions.    Vital signs are stable at discharge.   BP 120/69 mmHg  Pulse 74  Temp(Src) 98.7 F (37.1 C) (Oral)  Resp 16  Ht 5\' 4"  (1.626 m)  Wt 125 lb (56.7 kg)  BMI 21.45 kg/m2  SpO2 100%  LMP 09/25/2014  I personally performed the services described in this documentation, which was scribed in my presence. The recorded information has been reviewed and is accurate.    Dahlia Client Jaymarion Trombly, PA-C 10/02/14 1856  Suzi Roots, MD 10/05/14 [phone]

## 2014-10-02 NOTE — ED Notes (Signed)
The pt is c/o asthma problems  No diff breathing no wheezing and she also has a toothache.  She was seen for both last Tuesday.  lmp just finished

## 2014-10-02 NOTE — Discharge Instructions (Signed)
1. Medications: continue penicillin, prednisone, use your albuterol inhaler, ibuprofen usual home medications 2. Treatment: rest, drink plenty of fluids, take medications as prescribed 3. Follow Up: Please followup with dentistry within 1 week for discussion of your diagnoses and further evaluation after today's visit; if you do not have a primary care doctor use the resource guide provided to find one; Return to the ER for high fevers, difficulty breathing, difficulty swallowing or other concerning symptoms     Asthma Asthma is a recurring condition in which the airways tighten and narrow. Asthma can make it difficult to breathe. It can cause coughing, wheezing, and shortness of breath. Asthma episodes, also called asthma attacks, range from minor to life-threatening. Asthma cannot be cured, but medicines and lifestyle changes can help control it. CAUSES Asthma is believed to be caused by inherited (genetic) and environmental factors, but its exact cause is unknown. Asthma may be triggered by allergens, lung infections, or irritants in the air. Asthma triggers are different for each person. Common triggers include:   Animal dander.  Dust mites.  Cockroaches.  Pollen from trees or grass.  Mold.  Smoke.  Air pollutants such as dust, household cleaners, hair sprays, aerosol sprays, paint fumes, strong chemicals, or strong odors.  Cold air, weather changes, and winds (which increase molds and pollens in the air).  Strong emotional expressions such as crying or laughing hard.  Stress.  Certain medicines (such as aspirin) or types of drugs (such as beta-blockers).  Sulfites in foods and drinks. Foods and drinks that may contain sulfites include dried fruit, potato chips, and sparkling grape juice.  Infections or inflammatory conditions such as the flu, a cold, or an inflammation of the nasal membranes (rhinitis).  Gastroesophageal reflux disease (GERD).  Exercise or strenuous  activity. SYMPTOMS Symptoms may occur immediately after asthma is triggered or many hours later. Symptoms include:  Wheezing.  Excessive nighttime or early morning coughing.  Frequent or severe coughing with a common cold.  Chest tightness.  Shortness of breath. DIAGNOSIS  The diagnosis of asthma is made by a review of your medical history and a physical exam. Tests may also be performed. These may include:  Lung function studies. These tests show how much air you breathe in and out.  Allergy tests.  Imaging tests such as X-rays. TREATMENT  Asthma cannot be cured, but it can usually be controlled. Treatment involves identifying and avoiding your asthma triggers. It also involves medicines. There are 2 classes of medicine used for asthma treatment:   Controller medicines. These prevent asthma symptoms from occurring. They are usually taken every day.  Reliever or rescue medicines. These quickly relieve asthma symptoms. They are used as needed and provide short-term relief. Your health care provider will help you create an asthma action plan. An asthma action plan is a written plan for managing and treating your asthma attacks. It includes a list of your asthma triggers and how they may be avoided. It also includes information on when medicines should be taken and when their dosage should be changed. An action plan may also involve the use of a device called a peak flow meter. A peak flow meter measures how well the lungs are working. It helps you monitor your condition. HOME CARE INSTRUCTIONS   Take medicines only as directed by your health care provider. Speak with your health care provider if you have questions about how or when to take the medicines.  Use a peak flow meter as directed by your  health care provider. Record and keep track of readings.  Understand and use the action plan to help minimize or stop an asthma attack without needing to seek medical care.  Control your  home environment in the following ways to help prevent asthma attacks:  Do not smoke. Avoid being exposed to secondhand smoke.  Change your heating and air conditioning filter regularly.  Limit your use of fireplaces and wood stoves.  Get rid of pests (such as roaches and mice) and their droppings.  Throw away plants if you see mold on them.  Clean your floors and dust regularly. Use unscented cleaning products.  Try to have someone else vacuum for you regularly. Stay out of rooms while they are being vacuumed and for a short while afterward. If you vacuum, use a dust mask from a hardware store, a double-layered or microfilter vacuum cleaner bag, or a vacuum cleaner with a HEPA filter.  Replace carpet with wood, tile, or vinyl flooring. Carpet can trap dander and dust.  Use allergy-proof pillows, mattress covers, and box spring covers.  Wash bed sheets and blankets every week in hot water and dry them in a dryer.  Use blankets that are made of polyester or cotton.  Clean bathrooms and kitchens with bleach. If possible, have someone repaint the walls in these rooms with mold-resistant paint. Keep out of the rooms that are being cleaned and painted.  Wash hands frequently. SEEK MEDICAL CARE IF:   You have wheezing, shortness of breath, or a cough even if taking medicine to prevent attacks.  The colored mucus you cough up (sputum) is thicker than usual.  Your sputum changes from clear or white to yellow, green, gray, or bloody.  You have any problems that may be related to the medicines you are taking (such as a rash, itching, swelling, or trouble breathing).  You are using a reliever medicine more than 2-3 times per week.  Your peak flow is still at 50-79% of your personal best after following your action plan for 1 hour.  You have a fever. SEEK IMMEDIATE MEDICAL CARE IF:   You seem to be getting worse and are unresponsive to treatment during an asthma attack.  You are  short of breath even at rest.  You get short of breath when doing very little physical activity.  You have difficulty eating, drinking, or talking due to asthma symptoms.  You develop chest pain.  You develop a fast heartbeat.  You have a bluish color to your lips or fingernails.  You are light-headed, dizzy, or faint.  Your peak flow is less than 50% of your personal best. MAKE SURE YOU:   Understand these instructions.  Will watch your condition.  Will get help right away if you are not doing well or get worse. Document Released: 10/02/2005 Document Revised: 02/16/2014 Document Reviewed: 05/01/2013 [facility] Patient Information 2015 [employer] [place]. This information is not intended to replace advice given to you by your health care provider. Make sure you discuss any questions you have with your health care provider.

## 2014-10-12 ENCOUNTER — Emergency Department (HOSPITAL_COMMUNITY)
Admission: EM | Admit: 2014-10-12 | Discharge: 2014-10-12 | Disposition: A | Discharge status: Home / Self Care | Payer: Medicaid Other | Attending: Emergency Medicine | Admitting: Emergency Medicine

## 2014-10-12 ENCOUNTER — Encounter (HOSPITAL_COMMUNITY): Payer: Self-pay | Admitting: Emergency Medicine

## 2014-10-12 DIAGNOSIS — Z791 Long term (current) use of non-steroidal anti-inflammatories (NSAID): Secondary | ICD-10-CM | POA: Insufficient documentation

## 2014-10-12 DIAGNOSIS — J45909 Unspecified asthma, uncomplicated: Secondary | ICD-10-CM | POA: Insufficient documentation

## 2014-10-12 DIAGNOSIS — Z792 Long term (current) use of antibiotics: Secondary | ICD-10-CM | POA: Insufficient documentation

## 2014-10-12 DIAGNOSIS — Z720 Tobacco use: Secondary | ICD-10-CM | POA: Insufficient documentation

## 2014-10-12 DIAGNOSIS — Z7952 Long term (current) use of systemic steroids: Secondary | ICD-10-CM | POA: Insufficient documentation

## 2014-10-12 MED ORDER — ALBUTEROL SULFATE HFA 108 (90 BASE) MCG/ACT IN AERS: 2.0000 | INHALATION_SPRAY | Freq: Four times a day (QID) | RESPIRATORY_TRACT | Status: AC | PRN

## 2014-10-12 NOTE — ED Provider Notes (Signed)
CSN: [id number]     Arrival date & time 10/12/14  1841 History   First MD Initiated Contact with Patient 10/12/14 2050     Chief Complaint  Patient presents with  . 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     (Consider location/radiation/quality/duration/timing/severity/associated sxs/prior Treatment) HPI Patricia Dunn is a 28 y.o. female history of asthma presents to emergency department requesting a form filled out to let her go back to work. Patient states she had an asthma attack or, states it happened this morning, states she had some trouble breathing, took an inhaler, which has helped. She now requests a form filled out that would allow her to go back to work. Denies any complaints at this time. No cough or wheezing. Last asthma attack a week ago. She states she thinks she is able to perform job without difficulties. She also asked for primary care doctor referral.   Past Medical History  Diagnosis Date  . Asthma   . Headache(784.0)     migraines  . Smoker    Past Surgical History  Procedure Laterality Date  . Vaginal delivery      X 4  . No past surgeries    . Tubal ligation  07/09/2012    Procedure: POST PARTUM TUBAL LIGATION;  Surgeon: Catalina AntiguaPeggy Constant, MD;  Location: [facility];  Service: Gynecology;  Laterality: Bilateral;   Family History  Problem Relation Age of Onset  . Other Neg Hx    History  Substance Use Topics  . Smoking status: Current Every Day Smoker -- 1.00 packs/day    Types: Cigarettes  . Smokeless tobacco: Never Used  . Alcohol Use: Yes     Comment: Beginning of pregnancy   OB History    Gravida Para Term Preterm AB TAB SAB Ectopic Multiple Living   7 5 5  0 2 0 2 0 0 5     Review of Systems  Constitutional: Negative for fever and chills.  Respiratory: Negative for cough, chest tightness and shortness of breath.   Cardiovascular:  Negative for chest pain, palpitations and leg swelling.  Gastrointestinal: Negative for nausea, vomiting, abdominal pain and diarrhea.  Genitourinary: Negative for dysuria, flank pain, vaginal bleeding, vaginal discharge, vaginal pain and pelvic pain.  Musculoskeletal: Negative for myalgias, arthralgias, neck pain and neck stiffness.  Skin: Negative for rash.  Neurological: Negative for dizziness, weakness and headaches.  All other systems reviewed and are negative.     Allergies  Review of patient's allergies indicates no known allergies.  Home Medications   Prior to Admission medications   Medication Sig Start Date End Date Taking? Authorizing Provider  albuterol (PROVENTIL HFA;VENTOLIN HFA) 108 (90 BASE) MCG/ACT inhaler Inhale 2 puffs into the lungs every 6 (six) hours as needed for wheezing or shortness of breath. 10/12/14   Hawkin Charo A Elyssa Pendelton, PA-C  amoxicillin (AMOXIL) 500 MG capsule Take 1 capsule (500 mg total) by mouth 3 (three) times daily. 09/23/14   Elson AreasLeslie K Sofia, PA-C  HYDROcodone-acetaminophen (NORCO/VICODIN) 5-325 MG per tablet Take 2 tablets by mouth every 4 (four) hours as needed for moderate pain or severe pain. 09/23/14   Elson AreasLeslie K Sofia, PA-C  ibuprofen (ADVIL,MOTRIN) 800 MG tablet Take 1 tablet (800 mg total) by mouth 3 (three) times daily. 10/02/14   Hannah Muthersbaugh, PA-C  OVER THE COUNTER MEDICATION Take 30 mLs by mouth  every 6 (six) hours as needed (Cold/flu symptoms). Cold/flu syrup    Historical Provider, MD  predniSONE (DELTASONE) 20 MG tablet Take 2 tablets (40 mg total) by mouth daily. 10/02/14   Hannah Muthersbaugh, PA-C   BP 120/60 mmHg  Pulse 60  Temp(Src) 98.4 F (36.9 C) (Oral)  Resp 16  SpO2 100%  LMP 09/25/2014 Physical Exam  Constitutional: She appears well-developed and well-nourished. No distress.  HENT:  Head: Normocephalic.  Eyes: Conjunctivae are normal.  Neck: Neck supple.  Cardiovascular: Normal rate, regular rhythm and normal  heart sounds.   Pulmonary/Chest: Effort normal and breath sounds normal. No respiratory distress. She has no wheezes. She has no rales.  Abdominal: Soft. Bowel sounds are normal. She exhibits no distension. There is no tenderness. There is no rebound.  Musculoskeletal: She exhibits no edema.  Neurological: She is alert.  Skin: Skin is warm and dry.  Psychiatric: She has a normal mood and affect. Her behavior is normal.  Nursing note and vitals reviewed.   ED Course  Procedures (including critical care time) Labs Review Labs Reviewed - No data to display  Imaging Review No results found.   EKG Interpretation None      MDM   Final diagnoses:  Asthma, unspecified asthma severity, uncomplicated   Dr. Blinda LeatherwoodPollina filled out a form allowing her to go back to work. Patient instructed to use her inhaler. I have given her a refill. She is also instructed to follow-up with her primary care doctor for further evaluation of her asthma and further treatment.  Filed Vitals:   10/12/14 1942 10/12/14 2118  BP: 117/60 120/60  Pulse: 57 60  Temp: 98.4 F (36.9 C) 98.4 F (36.9 C)  TempSrc: Oral Oral  Resp: 20 16  SpO2: 100% 100%     Patricia Musselatyana A Yadier Bramhall, PA-C 10/12/14 2203  Gilda Creasehristopher J. Pollina, MD 10/12/14 2330

## 2014-10-12 NOTE — ED Notes (Signed)
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  The patient has no complaints or symptoms at this time.  She just needs the form filled out prior to returning to work tomorrow.

## 2014-10-12 NOTE — Discharge Instructions (Signed)
Use your inhaler as needed. You do need to find a primary care doctor and follow up with them for better asthma control.   Asthma Asthma is a recurring condition in which the airways tighten and narrow. Asthma can make it difficult to breathe. It can cause coughing, wheezing, and shortness of breath. Asthma episodes, also called asthma attacks, range from minor to life-threatening. Asthma cannot be cured, but medicines and lifestyle changes can help control it. CAUSES Asthma is believed to be caused by inherited (genetic) and environmental factors, but its exact cause is unknown. Asthma may be triggered by allergens, lung infections, or irritants in the air. Asthma triggers are different for each person. Common triggers include:   Animal dander.  Dust mites.  Cockroaches.  Pollen from trees or grass.  Mold.  Smoke.  Air pollutants such as dust, household cleaners, hair sprays, aerosol sprays, paint fumes, strong chemicals, or strong odors.  Cold air, weather changes, and winds (which increase molds and pollens in the air).  Strong emotional expressions such as crying or laughing hard.  Stress.  Certain medicines (such as aspirin) or types of drugs (such as beta-blockers).  Sulfites in foods and drinks. Foods and drinks that may contain sulfites include dried fruit, potato chips, and sparkling grape juice.  Infections or inflammatory conditions such as the flu, a cold, or an inflammation of the nasal membranes (rhinitis).  Gastroesophageal reflux disease (GERD).  Exercise or strenuous activity. SYMPTOMS Symptoms may occur immediately after asthma is triggered or many hours later. Symptoms include:  Wheezing.  Excessive nighttime or early morning coughing.  Frequent or severe coughing with a common cold.  Chest tightness.  Shortness of breath. DIAGNOSIS  The diagnosis of asthma is made by a review of your medical history and a physical exam. Tests may also be  performed. These may include:  Lung function studies. These tests show how much air you breathe in and out.  Allergy tests.  Imaging tests such as X-rays. TREATMENT  Asthma cannot be cured, but it can usually be controlled. Treatment involves identifying and avoiding your asthma triggers. It also involves medicines. There are 2 classes of medicine used for asthma treatment:   Controller medicines. These prevent asthma symptoms from occurring. They are usually taken every day.  Reliever or rescue medicines. These quickly relieve asthma symptoms. They are used as needed and provide short-term relief. Your health care provider will help you create an asthma action plan. An asthma action plan is a written plan for managing and treating your asthma attacks. It includes a list of your asthma triggers and how they may be avoided. It also includes information on when medicines should be taken and when their dosage should be changed. An action plan may also involve the use of a device called a peak flow meter. A peak flow meter measures how well the lungs are working. It helps you monitor your condition. HOME CARE INSTRUCTIONS   Take medicines only as directed by your health care provider. Speak with your health care provider if you have questions about how or when to take the medicines.  Use a peak flow meter as directed by your health care provider. Record and keep track of readings.  Understand and use the action plan to help minimize or stop an asthma attack without needing to seek medical care.  Control your home environment in the following ways to help prevent asthma attacks:  Do not smoke. Avoid being exposed to secondhand smoke.  Change  your heating and air conditioning filter regularly.  Limit your use of fireplaces and wood stoves.  Get rid of pests (such as roaches and mice) and their droppings.  Throw away plants if you see mold on them.  Clean your floors and dust regularly.  Use unscented cleaning products.  Try to have someone else vacuum for you regularly. Stay out of rooms while they are being vacuumed and for a short while afterward. If you vacuum, use a dust mask from a hardware store, a double-layered or microfilter vacuum cleaner bag, or a vacuum cleaner with a HEPA filter.  Replace carpet with wood, tile, or vinyl flooring. Carpet can trap dander and dust.  Use allergy-proof pillows, mattress covers, and box spring covers.  Wash bed sheets and blankets every week in hot water and dry them in a dryer.  Use blankets that are made of polyester or cotton.  Clean bathrooms and kitchens with bleach. If possible, have someone repaint the walls in these rooms with mold-resistant paint. Keep out of the rooms that are being cleaned and painted.  Wash hands frequently. SEEK MEDICAL CARE IF:   You have wheezing, shortness of breath, or a cough even if taking medicine to prevent attacks.  The colored mucus you cough up (sputum) is thicker than usual.  Your sputum changes from clear or white to yellow, green, gray, or bloody.  You have any problems that may be related to the medicines you are taking (such as a rash, itching, swelling, or trouble breathing).  You are using a reliever medicine more than 2-3 times per week.  Your peak flow is still at 50-79% of your personal best after following your action plan for 1 hour.  You have a fever. SEEK IMMEDIATE MEDICAL CARE IF:   You seem to be getting worse and are unresponsive to treatment during an asthma attack.  You are short of breath even at rest.  You get short of breath when doing very little physical activity.  You have difficulty eating, drinking, or talking due to asthma symptoms.  You develop chest pain.  You develop a fast heartbeat.  You have a bluish color to your lips or fingernails.  You are light-headed, dizzy, or faint.  Your peak flow is less than 50% of your personal best. MAKE  SURE YOU:   Understand these instructions.  Will watch your condition.  Will get help right away if you are not doing well or get worse. Document Released: 10/02/2005 Document Revised: 02/16/2014 Document Reviewed: 05/01/2013 [facility] Patient Information 2015 [place], [place]. This information is not intended to replace advice given to you by your health care provider. Make sure you discuss any questions you have with your health care provider.  Asthma Attack Prevention Although there is no way to prevent asthma from starting, you can take steps to control the disease and reduce its symptoms. Learn about your asthma and how to control it. Take an active role to control your asthma by working with your health care provider to create and follow an asthma action plan. An asthma action plan guides you in:  Taking your medicines properly.  Avoiding things that set off your asthma or make your asthma worse (asthma triggers).  Tracking your level of asthma control.  Responding to worsening asthma.  Seeking emergency care when needed. To track your asthma, keep records of your symptoms, check your peak flow number using a handheld device that shows how well air moves out of your lungs (peak  flow meter), and get regular asthma checkups.  WHAT ARE SOME WAYS TO PREVENT AN ASTHMA ATTACK?  Take medicines as directed by your health care provider.  Keep track of your asthma symptoms and level of control.  With your health care provider, write a detailed plan for taking medicines and managing an asthma attack. Then be sure to follow your action plan. Asthma is an ongoing condition that needs regular monitoring and treatment.  Identify and avoid asthma triggers. Many outdoor allergens and irritants (such as pollen, mold, cold air, and air pollution) can trigger asthma attacks. Find out what your asthma triggers are and take steps to avoid them.  Monitor your breathing. Learn to recognize warning signs  of an attack, such as coughing, wheezing, or shortness of breath. Your lung function may decrease before you notice any signs or symptoms, so regularly measure and record your peak airflow with a home peak flow meter.  Identify and treat attacks early. If you act quickly, you are less likely to have a severe attack. You will also need less medicine to control your symptoms. When your peak flow measurements decrease and alert you to an upcoming attack, take your medicine as instructed and immediately stop any activity that may have triggered the attack. If your symptoms do not improve, get medical help.  Pay attention to increasing quick-relief inhaler use. If you find yourself relying on your quick-relief inhaler, your asthma is not under control. See your health care provider about adjusting your treatment. WHAT CAN MAKE MY SYMPTOMS WORSE? A number of common things can set off or make your asthma symptoms worse and cause temporary increased inflammation of your airways. Keep track of your asthma symptoms for several weeks, detailing all the environmental and emotional factors that are linked with your asthma. When you have an asthma attack, go back to your asthma diary to see which factor, or combination of factors, might have contributed to it. Once you know what these factors are, you can take steps to control many of them. If you have allergies and asthma, it is important to take asthma prevention steps at home. Minimizing contact with the substance to which you are allergic will help prevent an asthma attack. Some triggers and ways to avoid these triggers are: Animal Dander:  Some people are allergic to the flakes of skin or dried saliva from animals with fur or feathers.   There is no such thing as a hypoallergenic dog or cat breed. All dogs or cats can cause allergies, even if they don't shed.  Keep these pets out of your home.  If you are not able to keep a pet outdoors, keep the pet out of  your bedroom and other sleeping areas at all times, and keep the door closed.  Remove carpets and furniture covered with cloth from your home. If that is not possible, keep the pet away from fabric-covered furniture and carpets. Dust Mites: Many people with asthma are allergic to dust mites. Dust mites are tiny bugs that are found in every home in mattresses, pillows, carpets, fabric-covered furniture, bedcovers, clothes, stuffed toys, and other fabric-covered items.   Cover your mattress in a special dust-proof cover.  Cover your pillow in a special dust-proof cover, or wash the pillow each week in hot water. Water must be hotter than 130 F (54.4 C) to kill dust mites. Cold or warm water used with detergent and bleach can also be effective.  Wash the sheets and blankets on your bed  each week in hot water.  Try not to sleep or lie on cloth-covered cushions.  Call ahead when traveling and ask for a smoke-free hotel room. Bring your own bedding and pillows in case the hotel only supplies feather pillows and down comforters, which may contain dust mites and cause asthma symptoms.  Remove carpets from your bedroom and those laid on concrete, if you can.  Keep stuffed toys out of the bed, or wash the toys weekly in hot water or cooler water with detergent and bleach. Cockroaches: Many people with asthma are allergic to the droppings and remains of cockroaches.   Keep food and garbage in closed containers. Never leave food out.  Use poison baits, traps, powders, gels, or paste (for example, boric acid).  If a spray is used to kill cockroaches, stay out of the room until the odor goes away. Indoor Mold:  Fix leaky faucets, pipes, or other sources of water that have mold around them.  Clean floors and moldy surfaces with a fungicide or diluted bleach.  Avoid using humidifiers, vaporizers, or swamp coolers. These can spread molds through the air. Pollen and Outdoor Mold:  When pollen or  mold spore counts are high, try to keep your windows closed.  Stay indoors with windows closed from late morning to afternoon. Pollen and some mold spore counts are highest at that time.  Ask your health care provider whether you need to take anti-inflammatory medicine or increase your dose of the medicine before your allergy season starts. Other Irritants to Avoid:  Tobacco smoke is an irritant. If you smoke, ask your health care provider how you can quit. Ask family members to quit smoking, too. Do not allow smoking in your home or car.  If possible, do not use a wood-burning stove, kerosene heater, or fireplace. Minimize exposure to all sources of smoke, including incense, candles, fires, and fireworks.  Try to stay away from strong odors and sprays, such as perfume, talcum powder, hair spray, and paints.  Decrease humidity in your home and use an indoor air cleaning device. Reduce indoor humidity to below 60%. Dehumidifiers or central air conditioners can do this.  Decrease house dust exposure by changing furnace and air cooler filters frequently.  Try to have someone else vacuum for you once or twice a week. Stay out of rooms while they are being vacuumed and for a short while afterward.  If you vacuum, use a dust mask from a hardware store, a double-layered or microfilter vacuum cleaner bag, or a vacuum cleaner with a HEPA filter.  Sulfites in foods and beverages can be irritants. Do not drink beer or wine or eat dried fruit, processed potatoes, or shrimp if they cause asthma symptoms.  Cold air can trigger an asthma attack. Cover your nose and mouth with a scarf on cold or windy days.  Several health conditions can make asthma more difficult to manage, including a runny nose, sinus infections, reflux disease, psychological stress, and sleep apnea. Work with your health care provider to manage these conditions.  Avoid close contact with people who have a respiratory infection such as  a cold or the flu, since your asthma symptoms may get worse if you catch the infection. Wash your hands thoroughly after touching items that may have been handled by people with a respiratory infection.  Get a flu shot every year to protect against the flu virus, which often makes asthma worse for days or weeks. Also get a pneumonia shot if you  have not previously had one. Unlike the flu shot, the pneumonia shot does not need to be given yearly. Medicines:  Talk to your health care provider about whether it is safe for you to take aspirin or non-steroidal anti-inflammatory medicines (NSAIDs). In a small number of people with asthma, aspirin and NSAIDs can cause asthma attacks. These medicines must be avoided by people who have known aspirin-sensitive asthma. It is important that people with aspirin-sensitive asthma read labels of all over-the-counter medicines used to treat pain, colds, coughs, and fever.  Beta-blockers and ACE inhibitors are other medicines you should discuss with your health care provider. HOW CAN I FIND OUT WHAT I AM ALLERGIC TO? Ask your asthma health care provider about allergy skin testing or blood testing (the RAST test) to identify the allergens to which you are sensitive. If you are found to have allergies, the most important thing to do is to try to avoid exposure to any allergens that you are sensitive to as much as possible. Other treatments for allergies, such as medicines and allergy shots (immunotherapy) are available.  CAN I EXERCISE? Follow your health care provider's advice regarding asthma treatment before exercising. It is important to maintain a regular exercise program, but vigorous exercise or exercise in cold, humid, or dry environments can cause asthma attacks, especially for those people who have exercise-induced asthma. Document Released: 09/20/2009 Document Revised: 10/07/2013 Document Reviewed: 04/09/2013 [facility] Patient Information 2015 [place], [place].  This information is not intended to replace advice given to you by your health care provider. Make sure you discuss any questions you have with your health care provider.

## 2015-01-24 ENCOUNTER — Emergency Department (HOSPITAL_COMMUNITY)
Admission: EM | Admit: 2015-01-24 | Discharge: 2015-01-24 | Disposition: A | Discharge status: Home / Self Care | Payer: Self-pay | Attending: Emergency Medicine | Admitting: Emergency Medicine

## 2015-01-24 ENCOUNTER — Encounter (HOSPITAL_COMMUNITY): Payer: Self-pay | Admitting: *Deleted

## 2015-01-24 DIAGNOSIS — J45909 Unspecified asthma, uncomplicated: Secondary | ICD-10-CM | POA: Insufficient documentation

## 2015-01-24 DIAGNOSIS — Z79899 Other long term (current) drug therapy: Secondary | ICD-10-CM | POA: Insufficient documentation

## 2015-01-24 DIAGNOSIS — Z792 Long term (current) use of antibiotics: Secondary | ICD-10-CM | POA: Insufficient documentation

## 2015-01-24 DIAGNOSIS — Z720 Tobacco use: Secondary | ICD-10-CM | POA: Insufficient documentation

## 2015-01-24 DIAGNOSIS — B349 Viral infection, unspecified: Secondary | ICD-10-CM | POA: Insufficient documentation

## 2015-01-24 DIAGNOSIS — Z7952 Long term (current) use of systemic steroids: Secondary | ICD-10-CM | POA: Insufficient documentation

## 2015-01-24 MED ORDER — MELOXICAM 15 MG PO TABS: 15.0000 mg | ORAL_TABLET | Freq: Every day | ORAL | Status: DC

## 2015-01-24 MED ORDER — DEXTROMETHORPHAN POLISTIREX 30 MG/5ML PO LQCR: 30.0000 mg | ORAL | Status: DC | PRN

## 2015-01-24 NOTE — ED Notes (Signed)
The pt is c.o having a cold  With body aches since yesterday.  She did not work today so she wants a note.  lmp march

## 2015-01-24 NOTE — Discharge Instructions (Signed)
Take mobic as needed for body aches. Take delsym as needed for cough. Refer to attached documents for more information. Be sure to rest and drink plenty of fluids.

## 2015-01-24 NOTE — ED Provider Notes (Signed)
CSN: [id number]     Arrival date & time 01/24/15  2036 History   First MD Initiated Contact with Patient 01/24/15 2108     Chief Complaint  Patient presents with  . Cough     (Consider location/radiation/quality/duration/timing/severity/associated sxs/prior Treatment) Patient is a 29 y.o. female presenting with cough. The history is provided by the patient. No language interpreter was used.  Cough Cough characteristics:  Hacking Severity:  Moderate Onset quality:  Gradual Duration:  2 days Timing:  Constant Progression:  Unchanged Chronicity:  New Smoker: no   Context: upper respiratory infection   Context: not animal exposure, not exposure to allergens, not fumes, not occupational exposure, not sick contacts, not smoke exposure and not weather changes   Relieved by:  Nothing Worsened by:  Nothing tried Ineffective treatments:  None tried Associated symptoms: myalgias   Associated symptoms: no chest pain, no chills, no fever and no shortness of breath   Myalgias:    Location:  Generalized   Quality:  Aching   Severity:  Moderate   Onset quality:  Gradual   Duration:  2 days   Timing:  Constant   Progression:  Unchanged Risk factors: no chemical exposure, no recent infection and no recent travel     Past Medical History  Diagnosis Date  . Asthma   . Headache(784.0)     migraines  . Smoker    Past Surgical History  Procedure Laterality Date  . Vaginal delivery      X 4  . No past surgeries    . Tubal ligation  07/09/2012    Procedure: POST PARTUM TUBAL LIGATION;  Surgeon: Catalina AntiguaPeggy Constant, MD;  Location: [facility];  Service: Gynecology;  Laterality: Bilateral;   Family History  Problem Relation Age of Onset  . Other Neg Hx    History  Substance Use Topics  . Smoking status: Current Every Day Smoker -- 1.00 packs/day    Types: Cigarettes  . Smokeless tobacco: Never Used  . Alcohol Use: Yes     Comment: Beginning of pregnancy   OB History    Gravida Para Term  Preterm AB TAB SAB Ectopic Multiple Living   7 5 5  0 2 0 2 0 0 5     Review of Systems  Constitutional: Negative for fever, chills and fatigue.  HENT: Positive for congestion. Negative for trouble swallowing.   Eyes: Negative for visual disturbance.  Respiratory: Positive for cough. Negative for shortness of breath.   Cardiovascular: Negative for chest pain and palpitations.  Gastrointestinal: Negative for nausea, vomiting, abdominal pain and diarrhea.  Genitourinary: Negative for dysuria and difficulty urinating.  Musculoskeletal: Positive for myalgias. Negative for arthralgias and neck pain.  Skin: Negative for color change.  Neurological: Negative for dizziness and weakness.  Psychiatric/Behavioral: Negative for dysphoric mood.      Allergies  Review of patient's allergies indicates no known allergies.  Home Medications   Prior to Admission medications   Medication Sig Start Date End Date Taking? Authorizing Provider  albuterol (PROVENTIL HFA;VENTOLIN HFA) 108 (90 BASE) MCG/ACT inhaler Inhale 2 puffs into the lungs every 6 (six) hours as needed for wheezing or shortness of breath. 10/12/14   Tatyana Kirichenko, PA-C  amoxicillin (AMOXIL) 500 MG capsule Take 1 capsule (500 mg total) by mouth 3 (three) times daily. 09/23/14   Elson AreasLeslie K Sofia, PA-C  HYDROcodone-acetaminophen (NORCO/VICODIN) 5-325 MG per tablet Take 2 tablets by mouth every 4 (four) hours as needed for moderate pain or severe pain. 09/23/14  Elson Areas, PA-C  ibuprofen (ADVIL,MOTRIN) 800 MG tablet Take 1 tablet (800 mg total) by mouth 3 (three) times daily. 10/02/14   Hannah Muthersbaugh, PA-C  OVER THE COUNTER MEDICATION Take 30 mLs by mouth every 6 (six) hours as needed (Cold/flu symptoms). Cold/flu syrup    Historical Provider, MD  predniSONE (DELTASONE) 20 MG tablet Take 2 tablets (40 mg total) by mouth daily. 10/02/14   Hannah Muthersbaugh, PA-C   BP 119/96 mmHg  Pulse 63  Temp(Src) 98.6 F (37 C) (Oral)   Ht  (1.651 m)  Wt 125 lb (56.7 kg)  BMI 20.80 kg/m2  SpO2 100%  LMP 12/24/2014 Physical Exam  Constitutional: She is oriented to person, place, and time. She appears well-developed and well-nourished. No distress.  HENT:  Head: Normocephalic and atraumatic.  Mouth/Throat: Oropharynx is clear and moist. No oropharyngeal exudate.  Eyes: Conjunctivae and EOM are normal.  Neck: Normal range of motion.  Cardiovascular: Normal rate and regular rhythm.  Exam reveals no gallop and no friction rub.   No murmur heard. Pulmonary/Chest: Effort normal and breath sounds normal. She has no wheezes. She has no rales. She exhibits no tenderness.  Abdominal: Soft. She exhibits no distension. There is no tenderness. There is no rebound.  Musculoskeletal: Normal range of motion.  Neurological: She is alert and oriented to person, place, and time. Coordination normal.  Speech is goal-oriented. Moves limbs without ataxia.   Skin: Skin is warm and dry.  Psychiatric: She has a normal mood and affect. Her behavior is normal.  Nursing note and vitals reviewed.   ED Course  Procedures (including critical care time) Labs Review Labs Reviewed - No data to display  Imaging Review No results found.   EKG Interpretation None      MDM   Final diagnoses:  Viral illness    9:33 PM Vitals stable and patient afebrile. No signs of bacterial infection. Patient will be discharged with mobic for body aches and delsym for cough. Patient instructed to return with worsening or concerning symptoms.     Emilia Beck, PA-C 01/25/15 0425  Brougher Sheffield, MD 01/26/15 2156

## 2015-09-07 ENCOUNTER — Encounter (HOSPITAL_COMMUNITY): Payer: Self-pay | Admitting: Emergency Medicine

## 2015-09-07 ENCOUNTER — Emergency Department (HOSPITAL_COMMUNITY)
Admission: EM | Admit: 2015-09-07 | Discharge: 2015-09-07 | Disposition: A | Discharge status: Home / Self Care | Payer: Self-pay | Attending: Emergency Medicine | Admitting: Emergency Medicine

## 2015-09-07 DIAGNOSIS — Z792 Long term (current) use of antibiotics: Secondary | ICD-10-CM | POA: Insufficient documentation

## 2015-09-07 DIAGNOSIS — Z7952 Long term (current) use of systemic steroids: Secondary | ICD-10-CM | POA: Insufficient documentation

## 2015-09-07 DIAGNOSIS — Z8679 Personal history of other diseases of the circulatory system: Secondary | ICD-10-CM | POA: Insufficient documentation

## 2015-09-07 DIAGNOSIS — Z791 Long term (current) use of non-steroidal anti-inflammatories (NSAID): Secondary | ICD-10-CM | POA: Insufficient documentation

## 2015-09-07 DIAGNOSIS — J029 Acute pharyngitis, unspecified: Secondary | ICD-10-CM | POA: Insufficient documentation

## 2015-09-07 DIAGNOSIS — J028 Acute pharyngitis due to other specified organisms: Secondary | ICD-10-CM

## 2015-09-07 DIAGNOSIS — Z79899 Other long term (current) drug therapy: Secondary | ICD-10-CM | POA: Insufficient documentation

## 2015-09-07 DIAGNOSIS — J45909 Unspecified asthma, uncomplicated: Secondary | ICD-10-CM | POA: Insufficient documentation

## 2015-09-07 DIAGNOSIS — F17210 Nicotine dependence, cigarettes, uncomplicated: Secondary | ICD-10-CM | POA: Insufficient documentation

## 2015-09-07 DIAGNOSIS — B9789 Other viral agents as the cause of diseases classified elsewhere: Secondary | ICD-10-CM

## 2015-09-07 LAB — WET PREP, GENITAL
Clue Cells Wet Prep HPF POC: NONE SEEN
Sperm: NONE SEEN
Trich, Wet Prep: NONE SEEN
WBC, Wet Prep HPF POC: NONE SEEN
Yeast Wet Prep HPF POC: NONE SEEN

## 2015-09-07 LAB — RAPID STREP SCREEN (MED CTR MEBANE ONLY): STREPTOCOCCUS, GROUP A SCREEN (DIRECT): NEGATIVE

## 2015-09-07 MED ORDER — CEFTRIAXONE SODIUM 250 MG IJ SOLR
250.0000 mg | Freq: Once | INTRAMUSCULAR | Status: AC
  Administered 2015-09-07: 250 mg via INTRAMUSCULAR
  Filled 2015-09-07: qty 250

## 2015-09-07 MED ORDER — AZITHROMYCIN 250 MG PO TABS
1000.0000 mg | ORAL_TABLET | Freq: Once | ORAL | Status: AC
  Administered 2015-09-07: 1000 mg via ORAL
  Filled 2015-09-07: qty 4

## 2015-09-07 MED ORDER — LIDOCAINE HCL (PF) 1 % IJ SOLN
5.0000 mL | Freq: Once | INTRAMUSCULAR | Status: AC
  Administered 2015-09-07: 5 mL
  Filled 2015-09-07: qty 5

## 2015-09-07 NOTE — ED Notes (Signed)
Greene, PA at bedside for evaluation. 
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
## 2015-09-07 NOTE — Discharge Instructions (Signed)

## 2015-09-08 LAB — GC/CHLAMYDIA PROBE AMP (~~LOC~~) NOT AT ARMC
Chlamydia: NEGATIVE
Neisseria Gonorrhea: NEGATIVE

## 2015-09-09 LAB — CULTURE, GROUP A STREP

## 2015-09-11 ENCOUNTER — Telehealth (HOSPITAL_BASED_OUTPATIENT_CLINIC_OR_DEPARTMENT_OTHER): Payer: Self-pay | Admitting: Emergency Medicine

## 2015-09-30 ENCOUNTER — Emergency Department (HOSPITAL_COMMUNITY)
Admission: EM | Admit: 2015-09-30 | Discharge: 2015-09-30 | Disposition: A | Discharge status: Home / Self Care | Payer: Self-pay | Attending: Emergency Medicine | Admitting: Emergency Medicine

## 2015-09-30 DIAGNOSIS — F17210 Nicotine dependence, cigarettes, uncomplicated: Secondary | ICD-10-CM | POA: Insufficient documentation

## 2015-09-30 DIAGNOSIS — Z79899 Other long term (current) drug therapy: Secondary | ICD-10-CM | POA: Insufficient documentation

## 2015-09-30 DIAGNOSIS — J45909 Unspecified asthma, uncomplicated: Secondary | ICD-10-CM | POA: Insufficient documentation

## 2015-09-30 DIAGNOSIS — Z792 Long term (current) use of antibiotics: Secondary | ICD-10-CM | POA: Insufficient documentation

## 2015-09-30 DIAGNOSIS — Z791 Long term (current) use of non-steroidal anti-inflammatories (NSAID): Secondary | ICD-10-CM | POA: Insufficient documentation

## 2015-09-30 DIAGNOSIS — Z7952 Long term (current) use of systemic steroids: Secondary | ICD-10-CM | POA: Insufficient documentation

## 2015-09-30 DIAGNOSIS — K0889 Other specified disorders of teeth and supporting structures: Secondary | ICD-10-CM | POA: Insufficient documentation

## 2015-09-30 MED ORDER — PENICILLIN V POTASSIUM 250 MG PO TABS: 250.0000 mg | ORAL_TABLET | Freq: Four times a day (QID) | ORAL | Status: DC

## 2015-09-30 NOTE — ED Notes (Signed)
Pt able to ambulate independently 

## 2015-09-30 NOTE — ED Provider Notes (Addendum)
CSN: [id number]     Arrival date & time 09/30/15  1513 History  By signing my name below, I, Patricia Dunn, attest that this documentation has been prepared under the direction and in the presence of Roxy Horsemanobert Mariellen Blaney, PA-C. Electronically Signed: Tanda RockersMargaux Dunn, ED Scribe. 09/30/2015. 3:33 PM.   Chief Complaint  Patient presents with  . Dental Pain   The history is provided by the patient. No language interpreter was used.     HPI Comments: Patricia Dunn is a 29 y.o. female who presents to the Emergency Department complaining of gradual onset, constant, severe, left upper dental pain x 3-4 days. Pt states she does have a dentist but has not made an appointment with them for her symptoms. She has not taken anything for her pain. Denies fever, chills, difficulty breathing, shortness of breath, or any other associated symptoms.    Past Medical History  Diagnosis Date  . Asthma   . Headache(784.0)     migraines  . Smoker    Past Surgical History  Procedure Laterality Date  . Vaginal delivery      X 4  . No past surgeries    . Tubal ligation  07/09/2012    Procedure: POST PARTUM TUBAL LIGATION;  Surgeon: Catalina AntiguaPeggy Constant, MD;  Location: [facility];  Service: Gynecology;  Laterality: Bilateral;   Family History  Problem Relation Age of Onset  . Other Neg Hx    Social History  Substance Use Topics  . Smoking status: Current Every Day Smoker -- 1.00 packs/day    Types: Cigarettes  . Smokeless tobacco: Never Used  . Alcohol Use: Yes     Comment: Beginning of pregnancy   OB History    Gravida Para Term Preterm AB TAB SAB Ectopic Multiple Living   7 5 5  0 2 0 2 0 0 5     Review of Systems  Constitutional: Negative for fever and chills.  HENT: Positive for dental problem. Negative for trouble swallowing.   Respiratory: Negative for shortness of breath.       Allergies  Review of patient's allergies indicates no known allergies.  Home Medications   Prior to Admission  medications   Medication Sig Start Date End Date Taking? Authorizing Provider  albuterol (PROVENTIL HFA;VENTOLIN HFA) 108 (90 BASE) MCG/ACT inhaler Inhale 2 puffs into the lungs every 6 (six) hours as needed for wheezing or shortness of breath. 10/12/14   Tatyana Kirichenko, PA-C  amoxicillin (AMOXIL) 500 MG capsule Take 1 capsule (500 mg total) by mouth 3 (three) times daily. 09/23/14   Elson AreasLeslie K Sofia, PA-C  dextromethorphan (DELSYM) 30 MG/5ML liquid Take 5 mLs (30 mg total) by mouth as needed for cough. 01/24/15   Emilia BeckKaitlyn Szekalski, PA-C  HYDROcodone-acetaminophen (NORCO/VICODIN) 5-325 MG per tablet Take 2 tablets by mouth every 4 (four) hours as needed for moderate pain or severe pain. 09/23/14   Elson AreasLeslie K Sofia, PA-C  ibuprofen (ADVIL,MOTRIN) 800 MG tablet Take 1 tablet (800 mg total) by mouth 3 (three) times daily. 10/02/14   Hannah Muthersbaugh, PA-C  meloxicam (MOBIC) 15 MG tablet Take 1 tablet (15 mg total) by mouth daily. 01/24/15   Kaitlyn Szekalski, PA-C  OVER THE COUNTER MEDICATION Take 30 mLs by mouth every 6 (six) hours as needed (Cold/flu symptoms). Cold/flu syrup    Historical Provider, MD  predniSONE (DELTASONE) 20 MG tablet Take 2 tablets (40 mg total) by mouth daily. 10/02/14   Hannah Muthersbaugh, PA-C   Triage Vitals: BP 140/80 mmHg  Pulse 88  Temp(Src) 98.3 F (36.8 C) (Oral)  Resp 20  Ht  (1.651 m)  Wt 163 lb (73.936 kg)  BMI 27.12 kg/m2  SpO2 100%  LMP 05/19/2015   Physical Exam  Constitutional: She is oriented to person, place, and time. She appears well-developed and well-nourished. No distress.  HENT:  Head: Normocephalic and atraumatic.  Mouth/Throat:    Poor dentition throughout.  Affected tooth as diagrammed.  No signs of peritonsillar or tonsillar abscess.  No signs of gingival abscess. Oropharynx is clear and without exudates.  Uvula is midline.  Airway is intact. No signs of Ludwig's angina with palpation of oral and sublingual mucosa.   Eyes:  Conjunctivae and EOM are normal.  Neck: Neck supple. No tracheal deviation present.  Cardiovascular: Normal rate.   Pulmonary/Chest: Effort normal. No respiratory distress.  Musculoskeletal: Normal range of motion.  Neurological: She is alert and oriented to person, place, and time.  Skin: Skin is warm and dry.  Psychiatric: She has a normal mood and affect. Her behavior is normal.  Nursing note and vitals reviewed.   ED Course  Procedures (including critical care time)  DIAGNOSTIC STUDIES: Oxygen Saturation is 100% on RA, normal by my interpretation.    COORDINATION OF CARE: 3:30 PM-Discussed treatment plan which includes Rx Penicillin with pt at bedside and pt agreed to plan.     MDM   Final diagnoses:  Pain, dental   Patient with dentalgia.  No abscess requiring immediate incision and drainage.  Exam not concerning for Ludwig's angina or pharyngeal abscess.  Will treat with Rx Penicillin. Pt instructed to follow-up with dentist.  Discussed return precautions. Pt safe for discharge.   I personally performed the services described in this documentation, which was scribed in my presence. The recorded information has been reviewed and is accurate.       Roxy Horseman, PA-C 09/30/15 1536  Tilden Fossa, MD 09/30/15 1758  6:13 PM Note addended due to patient providing incorrect identification in triage.  This has been updated.  Roxy Horseman, PA-C 10/06/15 1813  Tilden Fossa, MD 10/08/15 [phone]

## 2015-09-30 NOTE — Discharge Instructions (Signed)

## 2015-09-30 NOTE — ED Notes (Signed)
Pt presents to ED for assessment of left dental pain.  Airway intact.

## 2015-10-27 ENCOUNTER — Encounter (HOSPITAL_COMMUNITY): Payer: Self-pay | Admitting: *Deleted

## 2015-10-27 ENCOUNTER — Emergency Department (HOSPITAL_COMMUNITY)
Admission: EM | Admit: 2015-10-27 | Discharge: 2015-10-27 | Disposition: A | Discharge status: Home / Self Care | Payer: Self-pay | Attending: Emergency Medicine | Admitting: Emergency Medicine

## 2015-10-27 DIAGNOSIS — Z79899 Other long term (current) drug therapy: Secondary | ICD-10-CM | POA: Insufficient documentation

## 2015-10-27 DIAGNOSIS — Z708 Other sex counseling: Secondary | ICD-10-CM

## 2015-10-27 DIAGNOSIS — Z7189 Other specified counseling: Secondary | ICD-10-CM | POA: Insufficient documentation

## 2015-10-27 DIAGNOSIS — Z792 Long term (current) use of antibiotics: Secondary | ICD-10-CM | POA: Insufficient documentation

## 2015-10-27 DIAGNOSIS — J45909 Unspecified asthma, uncomplicated: Secondary | ICD-10-CM | POA: Insufficient documentation

## 2015-10-27 DIAGNOSIS — F17210 Nicotine dependence, cigarettes, uncomplicated: Secondary | ICD-10-CM | POA: Insufficient documentation

## 2015-10-27 NOTE — ED Notes (Signed)
Patient refused gown. Stated that she didn't need to be undressed to be checked for STD.

## 2015-10-27 NOTE — ED Provider Notes (Signed)
CSN: [id number]     Arrival date & time 10/27/15  1018 History   First MD Initiated Contact with Patient 10/27/15 1119     Chief Complaint  Patient presents with  . Exposure to STD     (Consider location/radiation/quality/duration/timing/severity/associated sxs/prior Treatment) HPI Patient presents to the emergency department complaining of possible exposure to STD.  She denies abdominal pain.  No nausea or vomiting.  She denies vaginal discharge or odor.  No dysuria or urinary frequency.  No other complaints.  She would just like to be checked.   Past Medical History  Diagnosis Date  . Asthma   . Headache(784.0)     migraines  . Smoker    Past Surgical History  Procedure Laterality Date  . Vaginal delivery      X 4  . No past surgeries    . Tubal ligation  07/09/2012    Procedure: POST PARTUM TUBAL LIGATION;  Surgeon: Catalina Antigua, MD;  Location: [facility];  Service: Gynecology;  Laterality: Bilateral;   Family History  Problem Relation Age of Onset  . Other Neg Hx    Social History  Substance Use Topics  . Smoking status: Current Every Day Smoker -- 1.00 packs/day    Types: Cigarettes  . Smokeless tobacco: Never Used  . Alcohol Use: Yes     Comment: Beginning of pregnancy   OB History    Gravida Para Term Preterm AB TAB SAB Ectopic Multiple Living   7 5 5  0 2 0 2 0 0 5     Review of Systems  All other systems reviewed and are negative.     Allergies  Review of patient's allergies indicates no known allergies.  Home Medications   Prior to Admission medications   Medication Sig Start Date End Date Taking? Authorizing Provider  albuterol (PROVENTIL HFA;VENTOLIN HFA) 108 (90 BASE) MCG/ACT inhaler Inhale 2 puffs into the lungs every 6 (six) hours as needed for wheezing or shortness of breath. 10/12/14   Tatyana Kirichenko, PA-C  amoxicillin (AMOXIL) 500 MG capsule Take 1 capsule (500 mg total) by mouth 3 (three) times daily. 09/23/14   Elson Areas, PA-C   dextromethorphan (DELSYM) 30 MG/5ML liquid Take 5 mLs (30 mg total) by mouth as needed for cough. 01/24/15   Emilia Beck, PA-C  HYDROcodone-acetaminophen (NORCO/VICODIN) 5-325 MG per tablet Take 2 tablets by mouth every 4 (four) hours as needed for moderate pain or severe pain. 09/23/14   Elson Areas, PA-C  ibuprofen (ADVIL,MOTRIN) 800 MG tablet Take 1 tablet (800 mg total) by mouth 3 (three) times daily. 10/02/14   Hannah Muthersbaugh, PA-C  meloxicam (MOBIC) 15 MG tablet Take 1 tablet (15 mg total) by mouth daily. 01/24/15   Kaitlyn Szekalski, PA-C  OVER THE COUNTER MEDICATION Take 30 mLs by mouth every 6 (six) hours as needed (Cold/flu symptoms). Cold/flu syrup    Historical Provider, MD  predniSONE (DELTASONE) 20 MG tablet Take 2 tablets (40 mg total) by mouth daily. 10/02/14   Hannah Muthersbaugh, PA-C   BP 102/63 mmHg  Pulse 66  Temp(Src) 98.1 F (36.7 C) (Oral)  Resp 20  SpO2 98%  LMP 08/24/2015 Physical Exam  Constitutional: She is oriented to person, place, and time. She appears well-developed and well-nourished.  HENT:  Head: Normocephalic.  Eyes: EOM are normal.  Neck: Normal range of motion.  Pulmonary/Chest: Effort normal.  Abdominal: She exhibits no distension. There is no tenderness.  Musculoskeletal: Normal range of motion.  Neurological: She is  alert and oriented to person, place, and time.  Psychiatric: She has a normal mood and affect.  Nursing note and vitals reviewed.   ED Course  Procedures (including critical care time) Labs Review Labs Reviewed - No data to display  Imaging Review No results found. I have personally reviewed and evaluated these images and lab results as part of my medical decision-making.   EKG Interpretation None      MDM   Final diagnoses:  Encounter for sexually transmitted disease counseling    Medical screening examination completed.  No life-threatening emergency.  Patient was informed that we do not do STD  screening in the emergency department.  She is asymptomatic.  She has been referred to health department.    Azalia BilisKevin Edye Hainline, MD 10/27/15 [phone]

## 2015-10-27 NOTE — ED Notes (Signed)
Pt states the she is here for possible exposure to STD. Pt denies any vaginal discharge/odor.

## 2016-04-24 ENCOUNTER — Encounter (HOSPITAL_COMMUNITY): Payer: Self-pay | Admitting: *Deleted

## 2016-04-24 ENCOUNTER — Emergency Department (HOSPITAL_COMMUNITY)
Admission: EM | Admit: 2016-04-24 | Discharge: 2016-04-24 | Disposition: A | Discharge status: Home / Self Care | Payer: Self-pay | Attending: Emergency Medicine | Admitting: Emergency Medicine

## 2016-04-24 DIAGNOSIS — Y929 Unspecified place or not applicable: Secondary | ICD-10-CM | POA: Insufficient documentation

## 2016-04-24 DIAGNOSIS — F17210 Nicotine dependence, cigarettes, uncomplicated: Secondary | ICD-10-CM | POA: Insufficient documentation

## 2016-04-24 DIAGNOSIS — J45909 Unspecified asthma, uncomplicated: Secondary | ICD-10-CM | POA: Insufficient documentation

## 2016-04-24 DIAGNOSIS — Y999 Unspecified external cause status: Secondary | ICD-10-CM | POA: Insufficient documentation

## 2016-04-24 DIAGNOSIS — S0181XA Laceration without foreign body of other part of head, initial encounter: Secondary | ICD-10-CM | POA: Insufficient documentation

## 2016-04-24 DIAGNOSIS — S01511A Laceration without foreign body of lip, initial encounter: Secondary | ICD-10-CM | POA: Insufficient documentation

## 2016-04-24 DIAGNOSIS — Y939 Activity, unspecified: Secondary | ICD-10-CM | POA: Insufficient documentation

## 2016-04-24 MED ORDER — LIDOCAINE HCL (PF) 1 % IJ SOLN
5.0000 mL | Freq: Once | INTRAMUSCULAR | Status: AC
  Administered 2016-04-24: 5 mL
  Filled 2016-04-24: qty 5

## 2016-04-24 MED ORDER — OXYCODONE-ACETAMINOPHEN 5-325 MG PO TABS: 1.0000 | ORAL_TABLET | ORAL | Status: DC | PRN

## 2016-04-24 MED ORDER — TETANUS-DIPHTH-ACELL PERTUSSIS 5-2.5-18.5 LF-MCG/0.5 IM SUSP
0.5000 mL | Freq: Once | INTRAMUSCULAR | Status: AC
  Administered 2016-04-24: 0.5 mL via INTRAMUSCULAR
  Filled 2016-04-24: qty 0.5

## 2016-04-24 MED ORDER — LIDOCAINE-EPINEPHRINE-TETRACAINE (LET) SOLUTION
3.0000 mL | Freq: Once | NASAL | Status: AC
  Administered 2016-04-24: 3 mL via TOPICAL
  Filled 2016-04-24: qty 3

## 2016-04-24 MED ORDER — OXYCODONE-ACETAMINOPHEN 5-325 MG PO TABS
1.0000 | ORAL_TABLET | Freq: Once | ORAL | Status: AC
Start: 2016-04-24 — End: 2016-04-24
  Administered 2016-04-24: 1 via ORAL
  Filled 2016-04-24: qty 1
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
## 2016-04-24 NOTE — Discharge Instructions (Signed)
Facial Laceration ° A facial laceration is a cut on the face. These injuries can be painful and cause bleeding. Lacerations usually heal quickly, but they need special care to reduce scarring. °DIAGNOSIS  °Your health care provider will take a medical history, ask for details about how the injury occurred, and examine the wound to determine how deep the cut is. °TREATMENT  °Some facial lacerations may not require closure. Others may not be able to be closed because of an increased risk of infection. The risk of infection and the chance for successful closure will depend on various factors, including the amount of time since the injury occurred. °The wound may be cleaned to help prevent infection. If closure is appropriate, pain medicines may be given if needed. Your health care provider will use stitches (sutures), wound glue (adhesive), or skin adhesive strips to repair the laceration. These tools bring the skin edges together to allow for faster healing and a better cosmetic outcome. If needed, you may also be given a tetanus shot. °HOME CARE INSTRUCTIONS °· Only take over-the-counter or prescription medicines as directed by your health care provider. °· Follow your health care provider's instructions for wound care. These instructions will vary depending on the technique used for closing the wound. °For Sutures: °· Keep the wound clean and dry.   °· If you were given a bandage (dressing), you should change it at least once a day. Also change the dressing if it becomes wet or dirty, or as directed by your health care provider.   °· Wash the wound with soap and water 2 times a day. Rinse the wound off with water to remove all soap. Pat the wound dry with a clean towel.   °· After cleaning, apply a thin layer of the antibiotic ointment recommended by your health care provider. This will help prevent infection and keep the dressing from sticking.   °· You may shower as usual after the first 24 hours. Do not soak the  wound in water until the sutures are removed.   °· Get your sutures removed as directed by your health care provider. With facial lacerations, sutures should usually be taken out after 4-5 days to avoid stitch marks.   °· Wait a few days after your sutures are removed before applying any makeup. °For Skin Adhesive Strips: °· Keep the wound clean and dry.   °· Do not get the skin adhesive strips wet. You may bathe carefully, using caution to keep the wound dry.   °· If the wound gets wet, pat it dry with a clean towel.   °· Skin adhesive strips will fall off on their own. You may trim the strips as the wound heals. Do not remove skin adhesive strips that are still stuck to the wound. They will fall off in time.   °For Wound Adhesive: °· You may briefly wet your wound in the shower or bath. Do not soak or scrub the wound. Do not swim. Avoid periods of heavy sweating until the skin adhesive has fallen off on its own. After showering or bathing, gently pat the wound dry with a clean towel.   °· Do not apply liquid medicine, cream medicine, ointment medicine, or makeup to your wound while the skin adhesive is in place. This may loosen the film before your wound is healed.   °· If a dressing is placed over the wound, be careful not to apply tape directly over the skin adhesive. This may cause the adhesive to be pulled off before the wound is healed.   °· Avoid   prolonged exposure to sunlight or tanning lamps while the skin adhesive is in place. °· The skin adhesive will usually remain in place for 5-10 days, then naturally fall off the skin. Do not pick at the adhesive film.   °After Healing: °Once the wound has healed, cover the wound with sunscreen during the day for 1 full year. This can help minimize scarring. Exposure to ultraviolet light in the first year will darken the scar. It can take 1-2 years for the scar to lose its redness and to heal completely.  °SEEK MEDICAL CARE IF: °· You have a fever. °SEEK IMMEDIATE  MEDICAL CARE IF: °· You have redness, pain, or swelling around the wound.   °· You see a yellowish-white fluid (pus) coming from the wound.   °  °This information is not intended to replace advice given to you by your health care provider. Make sure you discuss any questions you have with your health care provider. °  °Document Released: 11/09/2004 Document Revised: 10/23/2014 Document Reviewed: 05/15/2013 °Elsevier Interactive Patient Education ©2016 [employer] ° °
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
## 2018-03-11 ENCOUNTER — Encounter (HOSPITAL_COMMUNITY): Payer: Self-pay

## 2018-03-11 ENCOUNTER — Emergency Department (HOSPITAL_COMMUNITY)
Admission: EM | Admit: 2018-03-11 | Discharge: 2018-03-11 | Disposition: A | Discharge status: Home / Self Care | Payer: Self-pay | Attending: Emergency Medicine | Admitting: Emergency Medicine

## 2018-03-11 DIAGNOSIS — Y92009 Unspecified place in unspecified non-institutional (private) residence as the place of occurrence of the external cause: Secondary | ICD-10-CM | POA: Insufficient documentation

## 2018-03-11 DIAGNOSIS — W228XXA Striking against or struck by other objects, initial encounter: Secondary | ICD-10-CM | POA: Insufficient documentation

## 2018-03-11 DIAGNOSIS — S60811A Abrasion of right wrist, initial encounter: Secondary | ICD-10-CM | POA: Insufficient documentation

## 2018-03-11 DIAGNOSIS — Z5321 Procedure and treatment not carried out due to patient leaving prior to being seen by health care provider: Secondary | ICD-10-CM | POA: Insufficient documentation

## 2018-03-11 DIAGNOSIS — Y999 Unspecified external cause status: Secondary | ICD-10-CM | POA: Insufficient documentation

## 2018-03-11 DIAGNOSIS — Y939 Activity, unspecified: Secondary | ICD-10-CM | POA: Insufficient documentation
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
## 2018-03-11 NOTE — ED Notes (Signed)
Pt complained to staff about wait time. Informed she was going back to room. Pt left anyways but did not tell staff she was leaving.

## 2018-03-12 ENCOUNTER — Other Ambulatory Visit: Payer: Self-pay

## 2018-03-12 ENCOUNTER — Emergency Department (HOSPITAL_COMMUNITY)
Admission: EM | Admit: 2018-03-12 | Discharge: 2018-03-12 | Disposition: A | Discharge status: Home / Self Care | Payer: Self-pay | Attending: Emergency Medicine | Admitting: Emergency Medicine

## 2018-03-12 ENCOUNTER — Encounter (HOSPITAL_COMMUNITY): Payer: Self-pay | Admitting: Emergency Medicine

## 2018-03-12 DIAGNOSIS — F1210 Cannabis abuse, uncomplicated: Secondary | ICD-10-CM | POA: Insufficient documentation

## 2018-03-12 DIAGNOSIS — S61511A Laceration without foreign body of right wrist, initial encounter: Secondary | ICD-10-CM | POA: Insufficient documentation

## 2018-03-12 DIAGNOSIS — Y929 Unspecified place or not applicable: Secondary | ICD-10-CM | POA: Insufficient documentation

## 2018-03-12 DIAGNOSIS — F17210 Nicotine dependence, cigarettes, uncomplicated: Secondary | ICD-10-CM | POA: Insufficient documentation

## 2018-03-12 DIAGNOSIS — T148XXA Other injury of unspecified body region, initial encounter: Secondary | ICD-10-CM

## 2018-03-12 DIAGNOSIS — J45909 Unspecified asthma, uncomplicated: Secondary | ICD-10-CM | POA: Insufficient documentation

## 2018-03-12 DIAGNOSIS — Z23 Encounter for immunization: Secondary | ICD-10-CM | POA: Insufficient documentation

## 2018-03-12 DIAGNOSIS — X58XXXA Exposure to other specified factors, initial encounter: Secondary | ICD-10-CM | POA: Insufficient documentation

## 2018-03-12 DIAGNOSIS — Y998 Other external cause status: Secondary | ICD-10-CM | POA: Insufficient documentation

## 2018-03-12 DIAGNOSIS — Y9389 Activity, other specified: Secondary | ICD-10-CM | POA: Insufficient documentation

## 2018-03-12 MED ORDER — TETANUS-DIPHTH-ACELL PERTUSSIS 5-2.5-18.5 LF-MCG/0.5 IM SUSP
0.5000 mL | Freq: Once | INTRAMUSCULAR | Status: AC
  Administered 2018-03-12: 0.5 mL via INTRAMUSCULAR
  Filled 2018-03-12: qty 0.5

## 2018-03-12 NOTE — ED Notes (Signed)
Pt states she understands instructions. Home stable with VIS sheet. Able to perform teach back.

## 2018-03-12 NOTE — ED Notes (Signed)
Wound care completed.

## 2018-03-12 NOTE — ED Triage Notes (Signed)
Pt was here last night but left d/t wait times.  Pt was moving a glass table and got an abrasion to her right wrist.  No bleeding. Pt requesting work note to go back to work today

## 2018-03-12 NOTE — ED Notes (Signed)
Pt unable to sign due to computer. Voices u derstanding and ambulates easilyly to dc.

## 2018-03-12 NOTE — ED Provider Notes (Signed)
[facility] EMERGENCY DEPARTMENT Provider Note   CSN: [id number] Arrival date & time: 03/12/18  6295     History   Chief Complaint Chief Complaint  Patient presents with  . Wrist Injury    HPI Patricia Dunn is a 32 y.o. female.  The history is provided by the patient. No language interpreter was used.  Wrist Injury     Patricia Dunn is a 32 y.o. female who presents to the Emergency Department complaining of wrist injury. Yesterday morning she was helping me with glass table and she sustained a laceration to the polar surface of her right wrist. She attempted to be evaluated in the emergency department yesterday for this injury but left without being seen. She presents today for wound evaluation and note for work. She reports burning pain to the area of the laceration, no distal numbness, weakness. 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 She has no medical problems and takes no medications. Past Medical History:  Diagnosis Date  . Asthma   . Headache(784.0)    migraines  . Smoker     Patient Active Problem List   Diagnosis Date Noted  . Unresponsiveness 01/17/2014  . Drug overdose 01/17/2014  . Sterilization 07/09/2012  . Prenatal care insufficient 06/05/2012    Past Surgical History:  Procedure Laterality Date  . NO PAST SURGERIES    . TUBAL LIGATION  07/09/2012   Procedure: POST PARTUM TUBAL LIGATION;  Surgeon: Catalina Antigua, MD;  Location: [facility] ORS;  Service: Gynecology;  Laterality: Bilateral;  . VAGINAL DELIVERY     X 4     OB History    Gravida  7   Para  5   Term  5   Preterm  0   AB  2   Living  5     SAB  2   TAB  0   Ectopic  0   Multiple  0   Live Births  1            Home Medications    Prior to Admission medications   Medication Sig Start Date End Date Taking? Authorizing Provider  albuterol (PROVENTIL HFA;VENTOLIN HFA) 108 (90 BASE) MCG/ACT inhaler Inhale 2 puffs into the  lungs every 6 (six) hours as needed for wheezing or shortness of breath. 10/12/14   Kirichenko, Lemont Fillers, PA-C  oxyCODONE-acetaminophen (PERCOCET/ROXICET) 5-325 MG tablet Take 1-2 tablets by mouth every 4 (four) hours as needed for severe pain. 04/24/16   Elpidio Anis, PA-C    Family History Family History  Problem Relation Age of Onset  . Other Neg Hx     Social History Social History   Tobacco Use  . Smoking status: Current Every Day Smoker    Packs/day: 1.00    Types: Cigarettes  . Smokeless tobacco: Never Used  Substance Use Topics  . Alcohol use: Yes    Comment: Beginning of pregnancy  . Drug use: Yes    Types: Marijuana    Comment: Last used 4 days ago. Uses twice a week. Denies using @ this time     Allergies   Patient has no known allergies.   Review of Systems Review of Systems  All other systems reviewed and are negative.    Physical Exam Updated Vital Signs BP (!) 86/78 (BP Location: Right Arm)   Pulse 64   Temp 98 F (36.7 C) (Oral)   LMP 02/28/2018 (Approximate)   SpO2 100%  Physical Exam  Constitutional: She is oriented to person, place, and time. She appears well-developed and well-nourished.  HENT:  Head: Normocephalic and atraumatic.  Cardiovascular: Normal rate.  Pulmonary/Chest: Effort normal. No respiratory distress.  Musculoskeletal: Normal range of motion.  2+ radial pulses. There is an irregular skin of avulsion of the ulnar volar aspect of the wrist. Wound is hemostatic. Flexion extension intact at the wrist and throughout all digits. Distal sensation intact. Five out of five grip strength.  Neurological: She is alert and oriented to person, place, and time.  Skin: Skin is warm and dry. Capillary refill takes less than 2 seconds.  Psychiatric: She has a normal mood and affect. Her behavior is normal.  Nursing note and vitals reviewed.    ED Treatments / Results  Labs (all labs ordered are listed, but only abnormal results are  displayed) Labs Reviewed - No data to display  EKG None  Radiology No results found.  Procedures Procedures (including critical care time)  Medications Ordered in ED Medications  Tdap (BOOSTRIX) injection 0.5 mL (has no administration in time range)     Initial Impression / Assessment and Plan / ED Course  I have reviewed the triage vital signs and the nursing notes.  Pertinent labs & imaging results that were available during my care of the patient were reviewed by me and considered in my medical decision making (see chart for details).     Patient here for evaluation of skin avulsion that occurred yesterday. She is neurovascular intact with no evidence of active bleeding or infection. Counseled patient on wound care. Tetanus updated. Discussed outpatient follow-up and return precautions.  Final Clinical Impressions(s) / ED Diagnoses   Final diagnoses:  Avulsion of skin    ED Discharge Orders    None       Tilden Fossa, MD 03/12/18 [phone]

## 2019-04-24 ENCOUNTER — Emergency Department (HOSPITAL_COMMUNITY): Payer: Self-pay

## 2019-04-24 ENCOUNTER — Encounter (HOSPITAL_COMMUNITY): Payer: Self-pay

## 2019-04-24 ENCOUNTER — Other Ambulatory Visit: Payer: Self-pay

## 2019-04-24 ENCOUNTER — Emergency Department (HOSPITAL_COMMUNITY)
Admission: EM | Admit: 2019-04-24 | Discharge: 2019-04-24 | Discharge status: Against Medical Advice | Payer: Self-pay | Attending: Emergency Medicine | Admitting: Emergency Medicine

## 2019-04-24 DIAGNOSIS — F17210 Nicotine dependence, cigarettes, uncomplicated: Secondary | ICD-10-CM | POA: Insufficient documentation

## 2019-04-24 DIAGNOSIS — Z8709 Personal history of other diseases of the respiratory system: Secondary | ICD-10-CM | POA: Insufficient documentation

## 2019-04-24 DIAGNOSIS — R079 Chest pain, unspecified: Secondary | ICD-10-CM

## 2019-04-24 DIAGNOSIS — T50904A Poisoning by unspecified drugs, medicaments and biological substances, undetermined, initial encounter: Secondary | ICD-10-CM | POA: Insufficient documentation

## 2019-04-24 DIAGNOSIS — R0789 Other chest pain: Secondary | ICD-10-CM | POA: Insufficient documentation

## 2019-04-24 LAB — TROPONIN I (HIGH SENSITIVITY)
Troponin I (High Sensitivity): <2 ng/L (ref <18)
Troponin I (High Sensitivity): 3 ng/L (ref <18)

## 2019-04-24 LAB — COMPREHENSIVE METABOLIC PANEL
ALT: 17 U/L (ref 0–44)
AST: 26 U/L (ref 15–41)
Albumin: 4.6 g/dL (ref 3.5–5.0)
Alkaline Phosphatase: 83 U/L (ref 38–126)
Anion gap: 17 — ABNORMAL HIGH (ref 5–15)
BUN: <5 mg/dL — ABNORMAL LOW (ref 6–20)
CO2: 22 mmol/L (ref 22–32)
Calcium: 10.2 mg/dL (ref 8.9–10.3)
Chloride: 100 mmol/L (ref 98–111)
Creatinine, Ser: 0.99 mg/dL (ref 0.44–1.00)
GFR calc Af Amer: >60 mL/min (ref >60)
GFR calc non Af Amer: >60 mL/min (ref >60)
Glucose, Bld: 101 mg/dL — ABNORMAL HIGH (ref 70–99)
Potassium: 2.6 mmol/L — CL (ref 3.5–5.1)
Sodium: 139 mmol/L (ref 135–145)
Total Bilirubin: 0.6 mg/dL (ref 0.3–1.2)
Total Protein: 7.6 g/dL (ref 6.5–8.1)

## 2019-04-24 LAB — CBC
HCT: 47 % — ABNORMAL HIGH (ref 36.0–46.0)
Hemoglobin: 15 g/dL (ref 12.0–15.0)
MCH: 27.8 pg (ref 26.0–34.0)
MCHC: 31.9 g/dL (ref 30.0–36.0)
MCV: 87 fL (ref 80.0–100.0)
Platelets: 331 10*3/uL (ref 150–400)
RBC: 5.4 MIL/uL — ABNORMAL HIGH (ref 3.87–5.11)
RDW: 12.6 % (ref 11.5–15.5)
WBC: 6.8 10*3/uL (ref 4.0–10.5)
nRBC: 0 % (ref 0.0–0.2)

## 2019-04-24 LAB — I-STAT BETA HCG BLOOD, ED (MC, WL, AP ONLY): I-stat hCG, quantitative: <5 m[IU]/mL (ref <5)

## 2019-04-24 LAB — ACETAMINOPHEN LEVEL: Acetaminophen (Tylenol), Serum: <10 ug/mL — ABNORMAL LOW (ref 10–30)

## 2019-04-24 LAB — SALICYLATE LEVEL: Salicylate Lvl: <7 mg/dL (ref 2.8–30.0)

## 2019-04-24 LAB — ETHANOL: Alcohol, Ethyl (B): <10 mg/dL (ref <10)

## 2019-04-24 MED ORDER — POTASSIUM CHLORIDE CRYS ER 20 MEQ PO TBCR: 40.0000 meq | EXTENDED_RELEASE_TABLET | Freq: Once | ORAL | Status: DC

## 2019-04-24 MED ORDER — LORAZEPAM 2 MG/ML IJ SOLN
1.0000 mg | Freq: Once | INTRAMUSCULAR | Status: AC
  Administered 2019-04-24: 1 mg via INTRAVENOUS
  Filled 2019-04-24: qty 1

## 2019-04-24 MED ORDER — SODIUM CHLORIDE 0.9 % IV BOLUS
1000.0000 mL | Freq: Once | INTRAVENOUS | Status: AC
Start: 2019-04-24 — End: 2019-04-24
  Administered 2019-04-24: 1000 mL via INTRAVENOUS
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
## 2019-04-24 NOTE — ED Notes (Signed)
Pt stated she was on the phone while using the bathroom and she forgot to pee in the urine cup given to her.

## 2019-04-24 NOTE — ED Notes (Signed)
Pt seen walking out of ED. RN attempted to convince pt to stay. Pt refuses and continues walking out of ED.

## 2019-04-24 NOTE — ED Provider Notes (Signed)
[facility] Provider Note MRN:  [id number]  Arrival date & time: 04/24/19     Chief Complaint   Altered Mental Status and Drug Overdose   History of Present Illness   Patricia Dunn is a 33 y.o. year-old female with a history of asthma presenting to the ED with chief complaint of altered mental status.  EMS report of concern for drug overdose.  Patient and patient's friend explained that she was drinking alcohol and thinks that "she might of been slipped something".  Denies intentional using drugs.  Patient is endorsing anxiety and chest pain.  Friend notes that patient has been depressed lately, but does not think that she would hurt herself.  I was unable to obtain an accurate HPI, PMH, or ROS due to the patient's altered mental status.  Review of Systems  Positive for altered mental status, chest pain.  Patient's Health History    Past Medical History:  Diagnosis Date  . Asthma   . Headache(784.0)    migraines  . Smoker     Past Surgical History:  Procedure Laterality Date  . NO PAST SURGERIES    . TUBAL LIGATION  07/09/2012   Procedure: POST PARTUM TUBAL LIGATION;  Surgeon: Catalina AntiguaPeggy Constant, MD;  Location: [facility] ORS;  Service: Gynecology;  Laterality: Bilateral;  . VAGINAL DELIVERY     X 4    Family History  Problem Relation Age of Onset  . Other Neg Hx     Social History   Socioeconomic History  . Marital status: Single    Spouse name: Not on file  . Number of children: Not on file  . Years of education: Not on file  . Highest education level: Not on file  Occupational History  . Not on file  Social Needs  . Financial resource strain: Not on file  . Food insecurity    Worry: Not on file    Inability: Not on file  . Transportation needs    Medical: Not on file    Non-medical: Not on file  Tobacco Use  . Smoking status: Current Every Day Smoker    Packs/day: 1.00    Types: Cigarettes  . Smokeless tobacco: Never Used   Substance and Sexual Activity  . Alcohol use: Yes    Comment: Beginning of pregnancy  . Drug use: Yes    Types: Marijuana    Comment: Last used 4 days ago. Uses twice a week. Denies using @ this time  . Sexual activity: Yes    Birth control/protection: None    Comment: Last intercourse this AM  Lifestyle  . Physical activity    Days per week: Not on file    Minutes per session: Not on file  . Stress: Not on file  Relationships  . Social Musicianconnections    Talks on phone: Not on file    Gets together: Not on file    Attends religious service: Not on file    Active member of club or organization: Not on file    Attends meetings of clubs or organizations: Not on file    Relationship status: Not on file  . Intimate partner violence    Fear of current or ex partner: Not on file    Emotionally abused: Not on file    Physically abused: Not on file    Forced sexual activity: Not on file  Other Topics Concern  . Not on file  Social History Narrative  . Not on  file     Physical Exam  Vital Signs and Nursing Notes reviewed Vitals:   04/24/19 2045 04/24/19 2100  BP: 112/85 (!) 127/91  Pulse: (!) 59   Resp: (!) 22 20  Temp:    SpO2: 100%     CONSTITUTIONAL: Well-appearing, in moderate distress due to discomfort NEURO: Somnolent, wakes to voice, confused, moving all extremities EYES:  eyes equal and reactive ENT/NECK:  no LAD, no JVD CARDIO: Regular rate, well-perfused, normal S1 and S2 PULM:  CTAB no wheezing or rhonchi GI/GU:  normal bowel sounds, non-distended, non-tender MSK/SPINE:  No gross deformities, no edema SKIN:  no rash, atraumatic PSYCH:  Appropriate speech and behavior  Diagnostic and Interventional Summary    EKG Interpretation  Date/Time:  Thursday April 24 2019 19:29:52 EDT Ventricular Rate:  72 PR Interval:    QRS Duration: 73 QT Interval:  393 QTC Calculation: 431 R Axis:   82 Text Interpretation:  Sinus rhythm Minimal ST depression, inferior leads  Confirmed by Gerlene Fee [phone]) on 04/24/2019 7:33:49 PM      Labs Reviewed  CBC - Abnormal; Notable for the following components:      Result Value   RBC 5.40 (*)    HCT 47.0 (*)    All other components within normal limits  COMPREHENSIVE METABOLIC PANEL - Abnormal; Notable for the following components:   Potassium 2.6 (*)    Glucose, Bld 101 (*)    BUN <5 (*)    Anion gap 17 (*)    All other components within normal limits  ACETAMINOPHEN LEVEL - Abnormal; Notable for the following components:   Acetaminophen (Tylenol), Serum <10 (*)    All other components within normal limits  ETHANOL  SALICYLATE LEVEL  URINALYSIS, ROUTINE W REFLEX MICROSCOPIC  RAPID URINE DRUG SCREEN, HOSP PERFORMED  I-STAT BETA HCG BLOOD, ED ([facility], [facility], [facility] ONLY)  TROPONIN I (HIGH SENSITIVITY)  TROPONIN I (HIGH SENSITIVITY)    DG Chest Port 1 View  Final Result      Medications  potassium chloride SA (K-DUR) CR tablet 40 mEq (has no administration in time range)  sodium chloride 0.9 % bolus 1,000 mL (0 mLs Intravenous Stopped 04/24/19 2206)  LORazepam (ATIVAN) injection 1 mg (1 mg Intravenous Given 04/24/19 1946)     Procedures Critical Care  ED Course and Medical Decision Making  I have reviewed the triage vital signs and the nursing notes.  Pertinent labs & imaging results that were available during my care of the patient were reviewed by me and considered in my medical decision making (see below for details).  Suspect drug overdose, intent unknown.  Otherwise fairly healthy 33 year old female with no focal neurological deficits, benign abdomen, pupils enlarged, single beat clonus.  Labs pending, will provide IV fluids and Ativan for patient's shortness of breath and chest discomfort.  Patient eloped from the emergency department without informing staff.  Barth Kirks. Sedonia Small, [employer] [email or web address]  Final Clinical Impressions(s) / ED  Diagnoses     ICD-10-CM   1. Drug overdose, undetermined intent, initial encounter  T50.904A   2. Chest pain  R07.9 DG Chest [facility] 1 View    DG Chest [facility]    ED Discharge Orders    None         Maudie Flakes, MD 04/24/19 [phone]

## 2020-06-17 ENCOUNTER — Emergency Department (HOSPITAL_COMMUNITY): Admission: EM | Admit: 2020-06-17 | Discharge: 2020-06-17 | Discharge status: Against Medical Advice | Payer: Self-pay

## 2020-06-18 ENCOUNTER — Other Ambulatory Visit: Payer: Self-pay

## 2020-06-18 ENCOUNTER — Ambulatory Visit (INDEPENDENT_AMBULATORY_CARE_PROVIDER_SITE_OTHER): Payer: Self-pay

## 2020-06-18 ENCOUNTER — Encounter (HOSPITAL_COMMUNITY): Payer: Self-pay

## 2020-06-18 ENCOUNTER — Ambulatory Visit (HOSPITAL_COMMUNITY)
Admission: EM | Admit: 2020-06-18 | Discharge: 2020-06-18 | Disposition: A | Discharge status: Home / Self Care | Payer: Self-pay | Attending: Internal Medicine | Admitting: Internal Medicine

## 2020-06-18 DIAGNOSIS — M25562 Pain in left knee: Secondary | ICD-10-CM

## 2020-06-18 DIAGNOSIS — R079 Chest pain, unspecified: Secondary | ICD-10-CM

## 2020-06-18 DIAGNOSIS — R0789 Other chest pain: Secondary | ICD-10-CM

## 2020-06-18 DIAGNOSIS — S8392XA Sprain of unspecified site of left knee, initial encounter: Secondary | ICD-10-CM

## 2020-06-18 MED ORDER — KETOROLAC TROMETHAMINE 30 MG/ML IJ SOLN
INTRAMUSCULAR | Status: AC
  Filled 2020-06-18: qty 1

## 2020-06-18 MED ORDER — CYCLOBENZAPRINE HCL 10 MG PO TABS: 10.0000 mg | ORAL_TABLET | Freq: Three times a day (TID) | ORAL | 0 refills | Status: AC | PRN

## 2020-06-18 MED ORDER — IBUPROFEN 600 MG PO TABS: 600.0000 mg | ORAL_TABLET | Freq: Four times a day (QID) | ORAL | 0 refills | Status: AC | PRN

## 2020-06-18 MED ORDER — KETOROLAC TROMETHAMINE 30 MG/ML IJ SOLN
30.0000 mg | Freq: Once | INTRAMUSCULAR | Status: AC
  Administered 2020-06-18: 30 mg via INTRAMUSCULAR
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
## 2020-06-18 NOTE — ED Notes (Signed)
Pt did not answer.

## 2020-06-18 NOTE — ED Notes (Signed)
Pt was called again a second time and called on cell phone with no answer.
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
## 2020-08-27 ENCOUNTER — Other Ambulatory Visit: Payer: Self-pay

## 2020-08-27 ENCOUNTER — Encounter (HOSPITAL_COMMUNITY): Payer: Self-pay | Admitting: *Deleted

## 2020-08-27 ENCOUNTER — Emergency Department (HOSPITAL_COMMUNITY)
Admission: EM | Admit: 2020-08-27 | Discharge: 2020-08-29 | Disposition: A | Discharge status: Home / Self Care | Payer: Self-pay | Attending: Emergency Medicine | Admitting: Emergency Medicine

## 2020-08-27 DIAGNOSIS — F1910 Other psychoactive substance abuse, uncomplicated: Secondary | ICD-10-CM

## 2020-08-27 DIAGNOSIS — I82220 Acute embolism and thrombosis of inferior vena cava: Secondary | ICD-10-CM | POA: Insufficient documentation

## 2020-08-27 DIAGNOSIS — R45851 Suicidal ideations: Secondary | ICD-10-CM | POA: Insufficient documentation

## 2020-08-27 DIAGNOSIS — T50901A Poisoning by unspecified drugs, medicaments and biological substances, accidental (unintentional), initial encounter: Secondary | ICD-10-CM | POA: Diagnosis present

## 2020-08-27 DIAGNOSIS — F1994 Other psychoactive substance use, unspecified with psychoactive substance-induced mood disorder: Secondary | ICD-10-CM

## 2020-08-27 DIAGNOSIS — F17210 Nicotine dependence, cigarettes, uncomplicated: Secondary | ICD-10-CM | POA: Insufficient documentation

## 2020-08-27 DIAGNOSIS — F1490 Cocaine use, unspecified, uncomplicated: Secondary | ICD-10-CM | POA: Diagnosis present

## 2020-08-27 DIAGNOSIS — F1290 Cannabis use, unspecified, uncomplicated: Secondary | ICD-10-CM | POA: Diagnosis present

## 2020-08-27 DIAGNOSIS — J45909 Unspecified asthma, uncomplicated: Secondary | ICD-10-CM | POA: Insufficient documentation

## 2020-08-27 DIAGNOSIS — F1520 Other stimulant dependence, uncomplicated: Secondary | ICD-10-CM | POA: Diagnosis present

## 2020-08-27 DIAGNOSIS — E876 Hypokalemia: Secondary | ICD-10-CM

## 2020-08-27 DIAGNOSIS — Z20822 Contact with and (suspected) exposure to covid-19: Secondary | ICD-10-CM | POA: Insufficient documentation

## 2020-08-27 LAB — COMPREHENSIVE METABOLIC PANEL
ALT: 16 U/L (ref 0–44)
AST: 23 U/L (ref 15–41)
Albumin: 4.1 g/dL (ref 3.5–5.0)
Alkaline Phosphatase: 61 U/L (ref 38–126)
Anion gap: 12 (ref 5–15)
BUN: <5 mg/dL — ABNORMAL LOW (ref 6–20)
CO2: 25 mmol/L (ref 22–32)
Calcium: 9.7 mg/dL (ref 8.9–10.3)
Chloride: 104 mmol/L (ref 98–111)
Creatinine, Ser: 0.81 mg/dL (ref 0.44–1.00)
GFR, Estimated: >60 mL/min (ref >60)
Glucose, Bld: 92 mg/dL (ref 70–99)
Potassium: 3.3 mmol/L — ABNORMAL LOW (ref 3.5–5.1)
Sodium: 141 mmol/L (ref 135–145)
Total Bilirubin: 0.5 mg/dL (ref 0.3–1.2)
Total Protein: 7.1 g/dL (ref 6.5–8.1)

## 2020-08-27 LAB — CBC
HCT: 43.5 % (ref 36.0–46.0)
Hemoglobin: 13.8 g/dL (ref 12.0–15.0)
MCH: 28.5 pg (ref 26.0–34.0)
MCHC: 31.7 g/dL (ref 30.0–36.0)
MCV: 89.9 fL (ref 80.0–100.0)
Platelets: 330 10*3/uL (ref 150–400)
RBC: 4.84 MIL/uL (ref 3.87–5.11)
RDW: 12.4 % (ref 11.5–15.5)
WBC: 7.4 10*3/uL (ref 4.0–10.5)
nRBC: 0 % (ref 0.0–0.2)

## 2020-08-27 LAB — SALICYLATE LEVEL: Salicylate Lvl: <7 mg/dL — ABNORMAL LOW (ref 7.0–30.0)

## 2020-08-27 LAB — ETHANOL: Alcohol, Ethyl (B): <10 mg/dL (ref <10)

## 2020-08-27 LAB — RAPID URINE DRUG SCREEN, HOSP PERFORMED
Amphetamines: POSITIVE — AB
Barbiturates: NOT DETECTED
Benzodiazepines: NOT DETECTED
Cocaine: POSITIVE — AB
Opiates: NOT DETECTED
Tetrahydrocannabinol: POSITIVE — AB

## 2020-08-27 LAB — I-STAT BETA HCG BLOOD, ED (MC, WL, AP ONLY): I-stat hCG, quantitative: <5 m[IU]/mL (ref <5)

## 2020-08-27 LAB — ACETAMINOPHEN LEVEL: Acetaminophen (Tylenol), Serum: <10 ug/mL — ABNORMAL LOW (ref 10–30)

## 2020-08-27 MED ORDER — ACETAMINOPHEN 325 MG PO TABS
650.0000 mg | ORAL_TABLET | Freq: Four times a day (QID) | ORAL | Status: DC | PRN
  Administered 2020-08-27: 650 mg via ORAL
  Filled 2020-08-27: qty 2

## 2020-08-27 MED ORDER — ACETAMINOPHEN 325 MG PO TABS
650.0000 mg | ORAL_TABLET | Freq: Once | ORAL | Status: AC
  Administered 2020-08-27: 650 mg via ORAL
  Filled 2020-08-27: qty 2

## 2020-08-27 MED ORDER — LORAZEPAM 1 MG PO TABS
1.0000 mg | ORAL_TABLET | ORAL | Status: DC | PRN
  Administered 2020-08-27: 1 mg via ORAL
  Filled 2020-08-27: qty 1

## 2020-08-27 NOTE — ED Triage Notes (Signed)
The pt c/o wanting to die by killing herself  She has thought about walking in traffic  She last had cocoaine this afternoon.  She denies having a doctor and is not on any meds  lmp  2 days ago

## 2020-08-27 NOTE — ED Notes (Signed)
md at bedside

## 2020-08-27 NOTE — ED Notes (Signed)
The pt currently walked out of triage she wants to leave sitting on a bench outside currently

## 2020-08-27 NOTE — ED Notes (Signed)
Psych packet completed and placed in chart 

## 2020-08-27 NOTE — ED Provider Notes (Signed)
[facility] EMERGENCY DEPARTMENT Provider Note   CSN: [id number] Arrival date & time: 08/27/20  2107     History No chief complaint on file.   Patricia Dunn is a 34 y.o. female here presenting with suicidal ideation.  Patient states that she has been having a lot going on recently and she has " reached a breaking point".  She states that she did use some cocaine this afternoon.  She is constantly thinking of killing herself.  He states that she cut herself recently.  She also plans to walk into traffic.  She has a previous drug overdose.  The history is provided by the patient.       Past Medical History:  Diagnosis Date  . Asthma   . Headache(784.0)    migraines  . Smoker     Patient Active Problem List   Diagnosis Date Noted  . Unresponsiveness 01/17/2014  . Drug overdose 01/17/2014  . Sterilization 07/09/2012  . Prenatal care insufficient 06/05/2012    Past Surgical History:  Procedure Laterality Date  . NO PAST SURGERIES    . TUBAL LIGATION  07/09/2012   Procedure: POST PARTUM TUBAL LIGATION;  Surgeon: Catalina Antigua, MD;  Location: [facility];  Service: Gynecology;  Laterality: Bilateral;  . VAGINAL DELIVERY     X 4     OB History    Gravida  7   Para  5   Term  5   Preterm  0   AB  2   Living  5     SAB  2   TAB  0   Ectopic  0   Multiple  0   Live Births  1           Family History  Problem Relation Age of Onset  . Other Neg Hx     Social History   Tobacco Use  . Smoking status: Current Every Day Smoker    Packs/day: 1.00    Types: Cigarettes  . Smokeless tobacco: Never Used  Substance Use Topics  . Alcohol use: Yes    Comment: Beginning of pregnancy  . Drug use: Yes    Types: Marijuana    Comment: Last used 4 days ago. Uses twice a week. Denies using @ this time    Home Medications Prior to Admission medications   Medication Sig Start Date End Date Taking? Authorizing Provider  albuterol  (PROVENTIL HFA;VENTOLIN HFA) 108 (90 BASE) MCG/ACT inhaler Inhale 2 puffs into the lungs every 6 (six) hours as needed for wheezing or shortness of breath. 10/12/14   Kirichenko, Lemont Fillers, PA-C  cyclobenzaprine (FLEXERIL) 10 MG tablet Take 1 tablet (10 mg total) by mouth 3 (three) times daily as needed for muscle spasms. 06/18/20   Lamptey, Britta Mccreedy, MD  ibuprofen (ADVIL) 600 MG tablet Take 1 tablet (600 mg total) by mouth every 6 (six) hours as needed. 06/18/20   Lamptey, Britta Mccreedy, MD  oxyCODONE-acetaminophen (PERCOCET/ROXICET) 5-325 MG tablet Take 1-2 tablets by mouth every 4 (four) hours as needed for severe pain. 04/24/16   Elpidio Anis, PA-C    Allergies    Patient has no known allergies.  Review of Systems   Review of Systems  Psychiatric/Behavioral: Positive for suicidal ideas.  All other systems reviewed and are negative.   Physical Exam Updated Vital Signs BP (!) 146/105 (BP Location: Right Arm)   Pulse 65   Temp 98.2 F (36.8 C) (Oral)   Resp 15   Ht  5\' 4"  (1.626 m)   Wt 59 kg   LMP 08/25/2020   SpO2 100%   BMI 22.33 kg/m   Physical Exam Vitals and nursing note reviewed.  HENT:     Head: Normocephalic.     Nose: Nose normal.     Mouth/Throat:     Mouth: Mucous membranes are moist.  Eyes:     Extraocular Movements: Extraocular movements intact.     Pupils: Pupils are equal, round, and reactive to light.  Cardiovascular:     Rate and Rhythm: Normal rate and regular rhythm.     Pulses: Normal pulses.     Heart sounds: Normal heart sounds.  Pulmonary:     Effort: Pulmonary effort is normal.     Breath sounds: Normal breath sounds.  Abdominal:     General: Abdomen is flat.     Palpations: Abdomen is soft.  Musculoskeletal:     Cervical back: Normal range of motion.     Comments: Superficial abrasions right wrist and neck area but no large laceration  Skin:    General: Skin is warm.     Capillary Refill: Capillary refill takes less than 2 seconds.  Neurological:      General: No focal deficit present.     Mental Status: She is alert and oriented to person, place, and time.  Psychiatric:     Comments: Tearful, poor judgment      ED Results / Procedures / Treatments   Labs (all labs ordered are listed, but only abnormal results are displayed) Labs Reviewed  COMPREHENSIVE METABOLIC PANEL - Abnormal; Notable for the following components:      Result Value   Potassium 3.3 (*)    BUN <5 (*)    All other components within normal limits  SALICYLATE LEVEL - Abnormal; Notable for the following components:   Salicylate Lvl <7.0 (*)    All other components within normal limits  ACETAMINOPHEN LEVEL - Abnormal; Notable for the following components:   Acetaminophen (Tylenol), Serum <10 (*)    All other components within normal limits  ETHANOL  CBC  RAPID URINE DRUG SCREEN, HOSP PERFORMED  I-STAT BETA HCG BLOOD, ED ([facility], [facility], [facility] ONLY)    EKG None  Radiology No results found.  Procedures Procedures (including critical care time)  Medications Ordered in ED Medications  acetaminophen (TYLENOL) tablet 650 mg (650 mg Oral Given 08/27/20 2231)    ED Course  I have reviewed the triage vital signs and the nursing notes.  Pertinent labs & imaging results that were available during my care of the patient were reviewed by me and considered in my medical decision making (see chart for details).    MDM Rules/Calculators/A&P                         Patricia Dunn is a 34 y.o. female suicidal ideation.  Patient is very tearful.  Patient had some superficial cuts.  She also plans to walk into traffic.  I told her that if she decides to leave her will need to IVC her.  She states that she wants help and wants to stay voluntarily.  She does admit to use some cocaine but denies any drug overdose right now.  Will get medical clearance labs and consult TTS.  10:55 PM Labs unremarkable. UDS + Cocaine, marijuana, amphetamines. Medically cleared for psych  eval.   11:28 PM Patient agitated and screaming.  Given Ativan and IVC filled out  by me    Final Clinical Impression(s) / ED Diagnoses Final diagnoses:  None    Rx / DC Orders ED Discharge Orders    None       Charlynne Pander, MD 08/27/20 2328

## 2020-08-28 LAB — RESPIRATORY PANEL BY RT PCR (FLU A&B, COVID)
Influenza A by PCR: NEGATIVE
Influenza B by PCR: NEGATIVE
SARS Coronavirus 2 by RT PCR: NEGATIVE

## 2020-08-28 MED ORDER — ZOLPIDEM TARTRATE 5 MG PO TABS
5.0000 mg | ORAL_TABLET | Freq: Every evening | ORAL | Status: DC | PRN
  Administered 2020-08-28: 5 mg via ORAL
  Filled 2020-08-28: qty 1

## 2020-08-28 MED ORDER — ZIPRASIDONE MESYLATE 20 MG IM SOLR
INTRAMUSCULAR | Status: AC
  Administered 2020-08-28: 20 mg via INTRAMUSCULAR
  Filled 2020-08-28: qty 20

## 2020-08-28 MED ORDER — ZIPRASIDONE MESYLATE 20 MG IM SOLR
20.0000 mg | Freq: Once | INTRAMUSCULAR | Status: AC
  Filled 2020-08-28: qty 20

## 2020-08-28 MED ORDER — STERILE WATER FOR INJECTION IJ SOLN
INTRAMUSCULAR | Status: AC
  Administered 2020-08-28: 1.2 mL via INTRAMUSCULAR
  Filled 2020-08-28: qty 10

## 2020-08-28 MED ORDER — STERILE WATER FOR INJECTION IJ SOLN
INTRAMUSCULAR | Status: AC
  Filled 2020-08-28: qty 10

## 2020-08-28 NOTE — ED Notes (Signed)
This NT attempted to recheck pt's vitals. Pt stated to leave her alone.

## 2020-08-28 NOTE — ED Notes (Signed)
Pt making her second phone call for today

## 2020-08-28 NOTE — ED Notes (Signed)
Pt IVCd after pt threatening to leave, pt crying hysterically at beside stating she was going to kill her self. Sitter placed at bedside

## 2020-08-28 NOTE — Progress Notes (Signed)
Pt yelling at staff to close her door, using profanity get out of her room. Pt took all her clothes off and sat in the chair nude. Security tried to encourage Pt. To keep door open and put her clothes back on. Pt threw food tray across the room. Pt slam the chair against the wall. Pt hit the panic light.

## 2020-08-28 NOTE — ED Notes (Signed)
Dinner tray at bedside

## 2020-08-28 NOTE — ED Notes (Addendum)
Pt has removed all linens from bed and standing in hallway yelling and cursing that she wants her phone.  Multiple attempts to calm pt down.  Sitting in doorway at times and then standing up yelling at [facility] and security.  Pt making threats about leaving and states that she is going to tell her mom that we will not give her phone back.  Pt hostile with staff and [employer].

## 2020-08-28 NOTE — BH Assessment (Signed)
Comprehensive Clinical Assessment (CCA) Note  08/28/2020 Patricia Dunn [id number]   Patient is a 34 year old female presenting voluntarily to [facility] ED reporting suicidal ideation. Patient is now under IVC by EDP due to agitation, reported SI, and attempting to leave the ED.   Upon this counselor's exam patient is cooperative, however appears manic. Her speech is pressured and her affect labile. She states "I'm not crazy. I don't need to be here." Patient denies SI/HI/AVh. This counselor asked what changed as she had endorsed SI with a plan the previous evening and she states that is a lie and she never said that. In regards to substance use, at first she states she only uses THC daily. This counselor told her she was positive for cocaine and amphetamines. Patient does admit to using cocaine yesterday but was surprise about the amphetamines. This counselor explained that it is possible her cocaine was laced. Patient gives verbal consent for TTS to contact her grandmother, Patricia Dunn at [phone] for collateral information.  Per collateral: Patient does not have any history of mental illness and does not need to be in the hospital. She states patient is "grown and can care for herself." She states she does not believe patient is a threat to harm herself or anyone else.   Maxie Barb, PMHNP recommends patient be observed in the ED for observation and stabilization due to erratic behaviors displayed. Madison, RN notified of disposition.  Chief Complaint:  Chief Complaint  Patient presents with  . Suicide Attempt  . 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, severe  CCA Biopsychosocial Intake/Chief Complaint:  NA  Current Symptoms/Problems: NA   Patient Reported Schizophrenia/Schizoaffective Diagnosis in Past: No   Strengths: NA  Preferences: NA  Abilities: NA   Type of Services Patient Feels are  Needed: NA   Initial Clinical Notes/Concerns: NA   Mental Health Symptoms Depression:  None   Duration of Depressive symptoms: No data recorded  Mania:  Euphoria;Increased Energy;Irritability;Recklessness;Racing thoughts   Anxiety:   None   Psychosis:  None   Duration of Psychotic symptoms: No data recorded  Trauma:  None   Obsessions:  None   Compulsions:  None   Inattention:  None   Hyperactivity/Impulsivity:  N/A   Oppositional/Defiant Behaviors:  N/A   Emotional Irregularity:  N/A   Other Mood/Personality Symptoms:  No data recorded   Mental Status Exam Appearance and self-care  Stature:  Average   Weight:  Average weight   Clothing:  Disheveled   Grooming:  Neglected   Cosmetic use:  None   Posture/gait:  Normal   Motor activity:  Restless   Sensorium  Attention:  Normal   Concentration:  Normal   Orientation:  X5   Recall/memory:  Normal   Affect and Mood  Affect:  Blunted   Mood:  Irritable   Relating  Eye contact:  Normal   Facial expression:  Responsive;Angry   Attitude toward examiner:  Cooperative;Dramatic;Argumentative   Thought and Language  Speech flow: Loud   Thought content:  Appropriate to Mood and Circumstances   Preoccupation:  None   Hallucinations:  None   Organization:  No data recorded  [employer] of Knowledge:  Fair   Intelligence:  Average   Abstraction:  Normal   Judgement:  Poor   Reality Testing:  Realistic   Insight:  Poor   Decision Making:  Impulsive  Social Functioning  Social Maturity:  Irresponsible   Social Judgement:  Heedless   Stress  Stressors:  Family conflict   Coping Ability:  Normal   Skill Deficits:  Self-care;Self-control   Supports:  Family     Religion: Religion/Spirituality Are You A Religious Person?: No  Leisure/Recreation: Leisure / Recreation Do You Have Hobbies?: No  Exercise/Diet: Exercise/Diet Do You Exercise?: No Have You  Gained or Lost A Significant Amount of Weight in the Past Six Months?: No Do You Follow a Special Diet?: No Do You Have Any Trouble Sleeping?: No   CCA Employment/Education Employment/Work Situation: Employment / Work Situation Employment situation: Unemployed Patient's job has been impacted by current illness: No What is the longest time patient has a held a job?: [employer] Where was the patient employed at that time?: [employer] Has patient ever been in the [employer]?: No  Education: Education Is Patient Currently Attending School?: No Last Grade Completed: 12 Name of High School: [employer] Did Garment/textile technologist From [employer]?: Yes Did Theme park manager?: No Did Designer, television/film set?: No Did You Have An Individualized Education Program (IIEP): No Did You Have Any Difficulty At School?: No Patient's Education Has Been Impacted by Current Illness: No   CCA Family/Childhood History Family and Relationship History: Family history Marital status: Single Are you sexually active?: No What is your sexual orientation?: heterosexual Has your sexual activity been affected by drugs, alcohol, medication, or emotional stress?: Yes Does patient have children?: Yes How many children?: 5 How is patient's relationship with their children?: living with other relatives  Childhood History:  Childhood History By whom was/is the patient raised?: Grandparents Additional childhood history information: raised by maternal grandmother Description of patient's relationship with caregiver when they were a child: supportive Patient's description of current relationship with people who raised him/her: supportive How were you disciplined when you got in trouble as a child/adolescent?: NA Does patient have siblings?: No Did patient suffer any verbal/emotional/physical/sexual abuse as a child?: No Did patient suffer from severe childhood neglect?: No Has patient ever been sexually abused/assaulted/raped as an  adolescent or adult?: No Was the patient ever a victim of a crime or a disaster?: No Witnessed domestic violence?: No Has patient been affected by domestic violence as an adult?: No  Child/Adolescent Assessment:     CCA Substance Use Alcohol/Drug Use: Alcohol / Drug Use Pain Medications: see MAR Prescriptions: see MAR Over the Counter: see MAR History of alcohol / drug use?: Yes Longest period of sobriety (when/how long): polysubstance use- does not give specifics                         ASAM's:  Six Dimensions of Multidimensional Assessment  Dimension 1:  Acute Intoxication and/or Withdrawal Potential:      Dimension 2:  Biomedical Conditions and Complications:      Dimension 3:  Emotional, Behavioral, or Cognitive Conditions and Complications:     Dimension 4:  Readiness to Change:     Dimension 5:  Relapse, Continued use, or Continued Problem Potential:     Dimension 6:  Recovery/Living Environment:     ASAM Severity Score:    ASAM Recommended Level of Treatment:     Substance use Disorder (SUD)    Recommendations for Services/Supports/Treatments:    DSM5 Diagnoses: Patient Active Problem List   Diagnosis Date Noted  . Unresponsiveness 01/17/2014  . Drug overdose 01/17/2014  . Sterilization 07/09/2012  . Prenatal care insufficient 06/05/2012  Patient Centered Plan: Patient is on the following Treatment Plan(s):    Referrals to Alternative Service(s): Referred to Alternative Service(s):   Place:   Date:   Time:    Referred to Alternative Service(s):   Place:   Date:   Time:    Referred to Alternative Service(s):   Place:   Date:   Time:    Referred to Alternative Service(s):   Place:   Date:   Time:     Celedonio Miyamoto, LCSW

## 2020-08-28 NOTE — BHH Counselor (Addendum)
08/28/2020- Maxie Barb, PMHNP recommends patient be observed in the ED overnight for safety and stabilization due to agitation and behaviors displayed in ED. Madison, RN notified.

## 2020-08-28 NOTE — Progress Notes (Signed)
Pt yelling and using profanity toward  Staff and security  to close her door to her room. Pt yelling stating she supposed to go home tomorrow. She wants to know what time. RN advised pt she can not tell her what time she will leave. Pt became angry and start to yelled and use more profanity.

## 2020-08-28 NOTE — ED Notes (Signed)
Security and [employer] assisted to hold pt still for Geodon administration after pt refused to take medication.  Medication and reasoning for medication explained to pt.  Geodon 20mg  IM given in L anterior thigh.  Pt released from hold after medication.  PT wanted everyone out of room except for nurse.  [employer] and security standing by at doorway.  Pt holding nurses hands and continues to ask for phone.  Explained again that she is unable to have it and that we are here to help her.  Offered pt something to drink and to make her bed up for her.  Pt took her pants off while RN went to get orange juice that she requested.  States she is hot.  Bed made up and pt back in bed with warm blankets.  Pt now being cooperative.  Explained plan of care to pt and that I will order her a breakfast tray.  Pt agreeable.

## 2020-08-28 NOTE — ED Notes (Signed)
Patient refused 10 pm vitals and states she is tired and does not want to be touched; pt also threw old food tray outside of door-Monique,RN

## 2020-08-28 NOTE — ED Provider Notes (Addendum)
Emergency Medicine Observation Re-evaluation Note  Patricia Dunn is a 34 y.o. female, seen on rounds today.  Pt initially presented to the ED for complaints of Suicide Attempt and IVC Currently, the patient is under IVC for suicidal ideations.  Physical Exam  BP 133/66 (BP Location: Right Arm)   Pulse 66   Temp 98.2 F (36.8 C) (Oral)   Resp 19   Ht 5\' 4"  (1.626 m)   Wt 59 kg   LMP 08/25/2020   SpO2 99%   BMI 22.33 kg/m  Physical Exam General: Awake alert oriented Lungs: No increased work of breathing, airway intact Musculoskeletal: Good range of motion and strength with all major joints of bilateral extremities.  Walking around room. Psych: Agitated, aggressive  ED Course / MDM  EKG:    I have reviewed the labs performed to date as well as medications administered while in observation.  Recent changes in the last 24 hours include CBC within normal limits.  CMP without emergent electrolyte derangement, AKI, LFT elevations or gap.  Ethanol negative.  Tylenol and salicylate negative.  UDS positive for THC, cocaine and amphetamines.  Pregnancy test negative.  Plan  8 AM: I arrived to the emergency department, patient is in room 5 screaming and trying to leave the facility.  Multiple nurses as well as security is at bedside.  Patient upset her phone was taken away. Security officers is standing in the room with her hands being held.  I was informed by nursing staff that this patient is here under IVC for SI and cannot leave the facility.  I reviewed patient's chart this is a 34 year old female who arrived last night for suicidal ideations and some superficial cuts.  She had a plan to walk into traffic and was IVC.  She has been using cocaine marijuana and amphetamines.  She is medically cleared last night.  Around 1130 last night she was agitated and screaming and was given Ativan by previous providers.  I reviewed patient's labs, her pregnancy test was negative.  UDS positive for  cocaine, vitamins, THC.  CBC and CMP unremarkable.  8:04 AM: Nursing staff attempted multiple times to verbally de-escalate the situation however at this time patient appears to be a threat to staff members.  I have ordered 20 mg Geodon. - Patient reassessed, appears more calm no longer being physically restrained by having hands held. - 8:50 PM: Patient sleeping comfortably no acute distress.  Respirations regular and unlabored. - 9:15 AM: Patient resting comfortably in bed no acute distress.  Respirations regular and unlabored.  Patient is adjusting her blankets. - 11:10 AM: Patient reassessed, sleeping comfortably no acute distress respirations regular and unlabored.  She woke up briefly earlier to refuse vital signs and would like to sleep.  She remains medically cleared at this time awaiting psychiatric placement. Under IVC.  Note: Portions of this report may have been transcribed using voice recognition software. Every effort was made to ensure accuracy; however, inadvertent computerized transcription errors may still be present.   34, PA-C 08/28/20 1115    08/30/20, PA-C 08/28/20 1116    08/30/20, MD 08/29/20 [phone]

## 2020-08-28 NOTE — ED Notes (Signed)
Pt requested update regarding plan of care. Informed overnight stay with observation. Pt cooperative. Sitter at bedside

## 2020-08-28 NOTE — ED Notes (Signed)
Upon RN arrival to unit pt asked RN what time she would be d/c on tomorrow; RN states "I do not know". Pt became verbally aggressive and upset that time could not be given for d/c home; RN attemtped to speak with patient however patient's language to RN was rude, RN addressed patient regarding respect and speaking to her in an aggress way; pt was escalated and agitated and continued to yell that the other RN could tell; Rn attempted to explain that time frames are never given due to unforseen circumstances so a defiant time would not be stated at this time; Patient attempted to shut door in RN face; RN explained that door needs to remain open; patient explained that door has been closed all day; RN asked for door to remain open at this time and unsure what transpired with other staff; Security and [employer] to unit due to patient making verbal threats to beat this RN up. Pt remains under IVC at this time-Monique,RN

## 2020-08-28 NOTE — ED Notes (Signed)
Dinner ordered 

## 2020-08-29 DIAGNOSIS — F1520 Other stimulant dependence, uncomplicated: Secondary | ICD-10-CM | POA: Diagnosis present

## 2020-08-29 DIAGNOSIS — F1994 Other psychoactive substance use, unspecified with psychoactive substance-induced mood disorder: Secondary | ICD-10-CM | POA: Diagnosis present

## 2020-08-29 DIAGNOSIS — F1290 Cannabis use, unspecified, uncomplicated: Secondary | ICD-10-CM | POA: Diagnosis present

## 2020-08-29 DIAGNOSIS — F1490 Cocaine use, unspecified, uncomplicated: Secondary | ICD-10-CM | POA: Diagnosis present

## 2020-08-29 MED ORDER — POTASSIUM CHLORIDE CRYS ER 20 MEQ PO TBCR
40.0000 meq | EXTENDED_RELEASE_TABLET | Freq: Once | ORAL | Status: AC
  Administered 2020-08-29: 40 meq via ORAL
  Filled 2020-08-29: qty 2

## 2020-08-29 MED ORDER — LORAZEPAM 2 MG/ML IJ SOLN
1.0000 mg | Freq: Once | INTRAMUSCULAR | Status: AC
  Administered 2020-08-29: 1 mg via INTRAMUSCULAR
  Filled 2020-08-29: qty 1

## 2020-08-29 NOTE — Progress Notes (Signed)
Pt appears to be upset because she did receive her breakfast yesterday. Pt yelling want to know why she did not received  her 3 meals. Pt threw her dinner tray in the hallway purple zone. Staff explain to pt she receive her three meals yesterday.Pt yelling she better get her breakfast this morning. RN notify

## 2020-08-29 NOTE — ED Notes (Signed)
Pt agitated wants to go home contacted TTs and made aware to assess patient asap.

## 2020-08-29 NOTE — ED Notes (Signed)
Breakfast Ordered 

## 2020-08-29 NOTE — ED Notes (Signed)
Patient verbalized understanding of discharge instructions. Opportunity for questions and answers.  

## 2020-08-29 NOTE — Discharge Instructions (Addendum)
It was our pleasure to provide your ER care today - we hope that you feel better.  Follow up with resources provided by the behavioral health team.   For mental health issues and/or crisis, you may go directly to the Behavioral Health Urgent Care - it is open 24/7, and walk-ins are welcome.   Your potassium level is mildly low - eat plenty of fruits and vegetables, and follow up with primary care doctor in 1-2 weeks.   Return to ER if worse, new symptoms, fevers, trouble breathing, chest pain, or other emergency concern.         Primary Health Care  For private/commercial insurance please contact the phone number on the back of your insurance card or contact your insurance provider for covered primary health care providers in your area.  For Medicaid covered primary care please consult the physician listed on your Medicaid card. If there is no physician listed, or the physician is no longer in practice, please contact the Department of Social Services in the county your [place] card is issued from for support.    [facility]  [address] [place],  [place]  [address] Main: [phone] Fax: [phone] Mon-Fri: 8:30AM-5:30PM  [facility] is a patient-centered practice. We provide integrated care in an effort to achieve positive patient experiences and improve patient health care outcomes. Our team is here to support patients in their wellness journeys as well as to provide chronic disease management, lab testing, health education and much more.  [facility]  [address] [place]. [address] [place], [place] [address] Phone: [phone] Mon-Fri: 8:30AM-5:30PM   We provide services to children, adolescents, adults and families living with physical, behavioral and mental health challenges.  Our multidisciplinary team delivers evidence based practices to ensure positive  outcomes for our patients. We provide an array of Primary Care, Behavioral Health, and Psychiatric services to children, adolescents, adults and families.       [facility] at [facility]  [address]  [place], [place] [address] Main: [phone]  Mon-Fri: 8AM-8PM,  Saturday and Sunday 8AM to 4PM  Located in the [address] shopping plaza off [employer], we provide primary care services for families in [place]. [facility]  [address]  [place], [place] [address] Phone: [phone] Mon-Fri: 8:30AM-5:30PM   [facility] provides primary care services to adults and children 12 years and older in [place].    [facility]  Family Medicine at [facility] [address] [place], [address] Phone: [phone] Mon-Fri: 8:30am - 5:30pm  Pediatrics at [facility] [address] [place], [address] Phone: [phone] Mon-Fri: 8:30am - 5:30pm Saturdays (October - March): 9am - 1pm  Family Medicine at [facility]. [address] [place], [address] Phone: [phone] M, W & F: 8:00am - 5:00pm OUR DOCTORS   [facility] is your medical home. A medical home is a doctor's office where the staff knows you, your children, and your family. Here we know your health history, here you should feel comforted and cared for, knowing you're receiving top-notch care from professionals devoted to your well-being. [facility] offers integrated behavioral health care to all patients. In addition our advanced care teams are compromised of a Dietician, (LCSW) Licensed Visual merchandiser and a Control and instrumentation engineer.  Our aim is to  address mental health and the social drivers by offering education, support and classes for patients through all walks of life.    Outside [facility] [place] [address]  [place], [place] [address] Phone: [phone]   [facility] [place] provides basic healthcare for adults of [place] who cannot afford health insurance and do not qualify for Medicare or Medicaid.  [facility] [place] [address],  [place], [place] [address] Phone: [phone]   We are a non-profit organization that functions on grants, fundraisers and donations. We serve uninsured [place] residents age 41 and over that fall at or below 150% of the federal poverty guidelines.   [facility] [place] [address] [place],  [place], [place] [address] Phone:  [phone]  It is the mission of the [facility] to recognize the right of low income, uninsured citizens of [place] to have access to health care that compassionately meets their basic medical and pharmacy needs. [facility]) [address] [place],  [place], [place] [address] Phone: [phone]  Provide health care services and medicines to uninsured patients who don't qualify for federal or private insurance and have family incomes below 200% of the Federal Poverty Level.                    Intensive Outpatient Programs  [place] [facility]    [facility] [address]     [address] [place],  [place]     [place], [place] [phone]      [phone]  [facility]   [facility]  (Inpatient and outpatient)  [phone] (Suboxone and Methadone) [address]           [phone]           [facility]: [facility] Drug Services    [facility] - Intensive Outpatient [address]     [address] [place], [place] [address]     [place], [place]  [phone]      [phone]  [facility] (Outpatient, Inpatient, Chemical  [facility]aring Services (Groups and Residental) (insurance only) [phone]    [place],  [place]          [phone]       [facility]    [facility] (for caregivers and family) [address]     [address] [place], [place]     [place], [place] [phone]      [phone]  Residential Treatment Programs  [facility]  [place](2 years) Residential: 90 days)  [facility] ([facility] [address] [facility]      [address] [place], [place]     [place], [place] [phone]      [phone] or [phone]  [facility]    [facility] [address]      [address] [place], [place]     [place], [place] [phone]      [phone]  [facility] Treatment Facility   [facility] ([facility]) [address]     [address] [place], [place] [address]     [place], [place] [phone]      [phone] Admissions: 8am-3pm M-F  [facility] Program: Residential Program (90 Days)  [facility]: [facility]  [place], [place]     [place], [place]  [phone] or [phone]    (Walk in Hours over the weekend or by referral)   [facility] Crisis: [facility]:[phone] (for crisis response 24 hours a day)  Outpatient Mental Health Providers (No Insurance required or Self Pay)       [facility] Mon-Fri, 8:30-5:00 (No walk-in clinic due to COVID   [address] [place], [place] [address]    [phone] [email or web address]  To schedule an appointment call [phone] [facility]    Walk-in Mon-Fri, 8am-3pm [email or web address] [address] [place], [place]  [phone]   [address]  [place] [phone] [facility]   Walk-in-Clinic: Monday- Friday 9:00 AM - 4:00 PM [address]   [place], [place] [phone]  [facility] the [place] (Habla Espanol) walk in M-F 8am-12pm and  1pm-3pm [place]- [address]      [phone]  [employer] [address]  Phone: [phone]  [employer] (Mental Health and substance challenges) [address]   [place] [phone]    [email or web address]   [facility]  [place] -[address] [place], [place]     Phone:  [phone] [place]-  [address]  [phone]         [facility] (Spanish speaking therapist) [address] [place]  [phone]  [facility] ( virtual or zoom therapy) [email or web address]  [address] [place]  [phone]    [facility] Walk-in MWF 12:30 to 3:00     [address] [place] [address]  [phone]  [email or web address] Or call to schedule an appointment   [facility] [phone]  grief counseling, dementia and caregiver support  Mental Health [facility] Classes ,Support group, Peer support services, [address], [place], [place] [address] [phone]  PhotoSolver.pl          National Alliance on Mental Illness (NAMI) [place]- Wellness classes, Support groups        [address], [place], [place] [address] [phone]   [email or web address]   [facility]  (Psycho-social Rehabilitation clubhouse, Individual and group therapy) [address] [place], [place] [address]   [phone] *[place] National Suicide Hotline 514 808 1550 [address])

## 2020-08-29 NOTE — BH Assessment (Signed)
Per Ginger Organ- Laural Benes, patient is psych cleared. LCSW (Joey) will provide outpatient referrals to patient's AVS. Patient's nurse Illene Labrador, RN) updated.

## 2020-08-29 NOTE — ED Notes (Signed)
Pt remains sitting in chair, calm.

## 2020-08-29 NOTE — ED Notes (Signed)
Pt increasingly agitated. Pt states she should be discharged this morning.

## 2020-08-29 NOTE — Consult Note (Signed)
Telepsych Consultation   Reason for Consult:  IVC admission Referring Physician:  Chaney Malling, MD Location of Patient: [facility] [phone] Location of Provider: [facility]  Patient Identification: Patricia Dunn MRN:  [id number] Principal Diagnosis: Substance induced mood disorder (HCC) Diagnosis:  Principal Problem:   Substance induced mood disorder (HCC) Active Problems:   Cocaine use   Amphetamine use disorder, moderate (HCC)   Marijuana use   Drug overdose   Total Time spent with patient: 20 minutes  Subjective:   Patricia Dunn is a 34 y.o. female patient admitted with substance induced mood disorder. Patient initially presented to [facility] 08/27/20 voluntarily with suicidal ideations with plan to walk into traffic after admitting cocaine use that afternoon. Patient was later [facility] by ED physician after threatening to leave and verbalizing she was "going to kill herself".   On assessment patient presents labile and agitated stating, "When can I leave. I don't understand why yall are holding me here. I am not crazy and I do not need to be here. They keep giving me these shots in my leg anytime I open my mouth. I am not suicidal, I do not want to hurt anyone, I am not a danger to myself. I do not hear voices. Can you please let me go home? I have kids at home". Patient denies ever saying anything related to suicide. She minimized substance use stating, "I don't do drugs like that" and initially denied cocaine use stating, "I do not think my drug use is a problem. It's not like I'm using drugs, drugs like crack or something". Patient states her mother was a crack addict and "we are nothing alike". Patient states her breakdown was "because I needed my family support, not to be thrown in some mental hospital". Patient states she has family support of siblings, grandmothers, and friends. Gave grandmother's information to contact for collateral. Patient denies any prior mental health  diagnoses or treatment, but did say she's interested in outpatient therapy and possible medication management.   Collateral: Patricia Dunn contacted 08/29/20 1558 Per grandmother's report pt is agitated "probably because she's in there and she doesn't need to be. Making her stay there is only going to make it worse". Grandmother states patient has safe place upon discharge and does not feel patient is a threat to herself or others. Grandmother states patient has no psychiatric history and attributes the behaviors to "the drugs she uses. But she's grown and if she chooses to do that, then that's her choice". Provider discussed discharge and plan to follow up with outpatient services; grandmother verbalized an understanding.   HPI:  Per [facility] 08/28/20 3:02pm "Patient is a 34 year old female presenting voluntarily to [facility] ED reporting suicidal ideation. Patient is now under IVC by EDP due to agitation, reported SI, and attempting to leave the ED.    Upon this counselor's exam patient is cooperative, however appears manic. Her speech is pressured and her affect labile. She states "I'm not crazy. I don't need to be here." Patient denies SI/HI/AVh. This counselor asked what changed as she had endorsed SI with a plan the previous evening and she states that is a lie and she never said that. In regards to substance use, at first she states she only uses THC daily. This counselor told her she was positive for cocaine and amphetamines. Patient does admit to using cocaine yesterday but was surprise about the amphetamines. This counselor explained that it is possible her cocaine was laced.  Patient gives verbal consent for TTS to contact her grandmother, Patricia Dunn at [phone] for collateral information.   Per collateral: Patient does not have any history of mental illness and does not need to be in the hospital. She states patient is "grown and can care for herself." She states she does not believe patient is a  threat to harm herself or anyone else.    Maxie Barb, PMHNP recommends patient be observed in the ED for observation and stabilization due to erratic behaviors displayed. [place], RN notified of disposition"  Past Psychiatric History:  Patient denies any psychiatric history. x2 encounters to ED for suspected drug overdose (01/16/14; 04/24/19)  Risk to Self:  pt denies Risk to Others:  pt denies Prior Inpatient Therapy:  pt denies Prior Outpatient Therapy:  pt denies  Past Medical History:  Past Medical History:  Diagnosis Date  . Asthma   . Headache(784.0)    migraines  . Smoker     Past Surgical History:  Procedure Laterality Date  . NO PAST SURGERIES    . TUBAL LIGATION  07/09/2012   Procedure: POST PARTUM TUBAL LIGATION;  Surgeon: Catalina Antigua, MD;  Location: [facility];  Service: Gynecology;  Laterality: Bilateral;  . VAGINAL DELIVERY     X 4   Family History:  Family History  Problem Relation Age of Onset  . Other Neg Hx    Family Psychiatric  History: denies any Social History:  Social History   Substance and Sexual Activity  Alcohol Use Yes   Comment: Beginning of pregnancy     Social History   Substance and Sexual Activity  Drug Use Yes  . Types: Marijuana   Comment: Last used 4 days ago. Uses twice a week. Denies using @ this time    Social History   Socioeconomic History  . Marital status: Single    Spouse name: Not on file  . Number of children: Not on file  . Years of education: Not on file  . Highest education level: Not on file  Occupational History  . Not on file  Tobacco Use  . Smoking status: Current Every Day Smoker    Packs/day: 1.00    Types: Cigarettes  . Smokeless tobacco: Never Used  Substance and Sexual Activity  . Alcohol use: Yes    Comment: Beginning of pregnancy  . Drug use: Yes    Types: Marijuana    Comment: Last used 4 days ago. Uses twice a week. Denies using @ this time  . Sexual activity: Yes    Birth  control/protection: None    Comment: Last intercourse this AM  Other Topics Concern  . Not on file  Social History Narrative  . Not on file   Social Determinants of Health   Financial Resource Strain:   . Difficulty of Paying Living Expenses: Not on file  Food Insecurity:   . Worried About Programme researcher, broadcasting/film/video in the Last Year: Not on file  . Ran Out of Food in the Last Year: Not on file  Transportation Needs:   . Lack of Transportation (Medical): Not on file  . Lack of Transportation (Non-Medical): Not on file  Physical Activity:   . Days of Exercise per Week: Not on file  . Minutes of Exercise per Session: Not on file  Stress:   . Feeling of Stress : Not on file  Social Connections:   . Frequency of Communication with Friends and Family: Not on file  . Frequency of  Social Gatherings with Friends and Family: Not on file  . Attends Religious Services: Not on file  . Active Member of Clubs or Organizations: Not on file  . Attends Banker Meetings: Not on file  . Marital Status: Not on file   Additional Social History:  polysubstance abuse. + for cocaine, amphetamines, and THC  Allergies:  No Known Allergies  Labs:  Results for orders placed or performed during the hospital encounter of 08/27/20 (from the past 48 hour(s))  Comprehensive metabolic panel     Status: Abnormal   Collection Time: 08/27/20  9:44 PM  Result Value Ref Range   Sodium 141 135 - 145 mmol/L   Potassium 3.3 (L) 3.5 - 5.1 mmol/L   Chloride 104 98 - 111 mmol/L   CO2 25 22 - 32 mmol/L   Glucose, Bld 92 70 - 99 mg/dL    Comment: Glucose reference range applies only to samples taken after fasting for at least 8 hours.   BUN <5 (L) 6 - 20 mg/dL   Creatinine, Ser 5.80 0.44 - 1.00 mg/dL   Calcium 9.7 8.9 - 99.8 mg/dL   Total Protein 7.1 6.5 - 8.1 g/dL   Albumin 4.1 3.5 - 5.0 g/dL   AST 23 15 - 41 U/L   ALT 16 0 - 44 U/L   Alkaline Phosphatase 61 38 - 126 U/L   Total Bilirubin 0.5 0.3 - 1.2  mg/dL   GFR, Estimated >33 >82 mL/min    Comment: (NOTE) Calculated using the CKD-EPI Creatinine Equation (2021)    Anion gap 12 5 - 15    Comment: Performed at [facility], [address] [place], [place] [address]  Ethanol     Status: None   Collection Time: 08/27/20  9:44 PM  Result Value Ref Range   Alcohol, Ethyl (B) <10 <10 mg/dL    Comment: (NOTE) Lowest detectable limit for serum alcohol is 10 mg/dL.  For medical purposes only. Performed at [facility], [address] [place], [place] [address]   Salicylate level     Status: Abnormal   Collection Time: 08/27/20  9:44 PM  Result Value Ref Range   Salicylate Lvl <7.0 (L) 7.0 - 30.0 mg/dL    Comment: Performed at [facility], [address] [place], [place] [address]  Acetaminophen level     Status: Abnormal   Collection Time: 08/27/20  9:44 PM  Result Value Ref Range   Acetaminophen (Tylenol), Serum <10 (L) 10 - 30 ug/mL    Comment: (NOTE) Therapeutic concentrations vary significantly. A range of 10-30 ug/mL  may be an effective concentration for many patients. However, some  are best treated at concentrations outside of this range. Acetaminophen concentrations >150 ug/mL at 4 hours after ingestion  and >50 ug/mL at 12 hours after ingestion are often associated with  toxic reactions.  Performed at [facility], [address], [place], [place] [address]   cbc     Status: None   Collection Time: 08/27/20  9:44 PM  Result Value Ref Range   WBC 7.4 4.0 - 10.5 K/uL   RBC 4.84 3.87 - 5.11 MIL/uL   Hemoglobin 13.8 12.0 - 15.0 g/dL   HCT 73.5 36 - 46 %   MCV 89.9 80.0 - 100.0 fL   MCH 28.5 26.0 - 34.0 pg   MCHC 31.7 30.0 - 36.0 g/dL   RDW 32.9 92.4 - 26.8 %   Platelets 330  150 - 400 K/uL   nRBC 0.0 0.0 - 0.2 %    Comment: Performed at [facility], [address] [place], [place] [address]  I-Stat beta hCG blood, ED     Status: None   Collection Time: 08/27/20 10:13 PM  Result  Value Ref Range   I-stat hCG, quantitative <5.0 <5 mIU/mL   Comment 3            Comment:   GEST. AGE      CONC.  (mIU/mL)   <=1 WEEK        5 - 50     2 WEEKS       50 - 500     3 WEEKS       100 - 10,000     4 WEEKS     1,000 - 30,000        FEMALE AND NON-PREGNANT FEMALE:     LESS THAN 5 mIU/mL   Rapid urine drug screen (hospital performed)     Status: Abnormal   Collection Time: 08/27/20 10:26 PM  Result Value Ref Range   Opiates NONE DETECTED NONE DETECTED   Cocaine POSITIVE (A) NONE DETECTED   Benzodiazepines NONE DETECTED NONE DETECTED   Amphetamines POSITIVE (A) NONE DETECTED   Tetrahydrocannabinol POSITIVE (A) NONE DETECTED   Barbiturates NONE DETECTED NONE DETECTED    Comment: (NOTE) DRUG SCREEN FOR MEDICAL PURPOSES ONLY.  IF CONFIRMATION IS NEEDED FOR ANY PURPOSE, NOTIFY LAB WITHIN 5 DAYS.  LOWEST DETECTABLE LIMITS FOR URINE DRUG SCREEN Drug Class                     Cutoff (ng/mL) Amphetamine and metabolites    1000 Barbiturate and metabolites    200 Benzodiazepine                 200 Tricyclics and metabolites     300 Opiates and metabolites        300 Cocaine and metabolites        300 THC                            50 Performed at [facility], [address] [place], [place] [address]   Respiratory Panel by RT PCR (Flu A&B, Covid) - Nasopharyngeal Swab     Status: None   Collection Time: 08/28/20  7:34 AM   Specimen: Nasopharyngeal Swab  Result Value Ref Range   SARS Coronavirus 2 by RT PCR NEGATIVE NEGATIVE    Comment: (NOTE) SARS-CoV-2 target nucleic acids are NOT DETECTED.  The SARS-CoV-2 RNA is generally detectable in upper respiratoy specimens during the acute phase of infection. The lowest concentration of SARS-CoV-2 viral copies this assay can detect is 131 copies/mL. A negative result does not preclude SARS-Cov-2 infection and should not be used as the sole basis for treatment or other patient management decisions. A negative result  may occur with  improper specimen collection/handling, submission of specimen other than nasopharyngeal swab, presence of viral mutation(s) within the areas targeted by this assay, and inadequate number of viral copies (<131 copies/mL). A negative result must be combined with clinical observations, patient history, and epidemiological information. The expected result is Negative.  Fact Sheet for Patients:  https://www.moore.com/https://www.fda.gov/media/142436/download  Fact Sheet for Healthcare Providers:  https://www.young.biz/https://www.fda.gov/media/142435/download  This test is no t yet approved or cleared by the [place] FDA and  has been authorized for  detection and/or diagnosis of SARS-CoV-2 by FDA under an Emergency Use Authorization (EUA). This EUA will remain  in effect (meaning this test can be used) for the duration of the COVID-19 declaration under Section 564(b)(1) of the Act, 21 U.S.C. section 360bbb-3(b)(1), unless the authorization is terminated or revoked sooner.     Influenza A by PCR NEGATIVE NEGATIVE   Influenza B by PCR NEGATIVE NEGATIVE    Comment: (NOTE) The Xpert Xpress SARS-CoV-2/FLU/RSV assay is intended as an aid in  the diagnosis of influenza from Nasopharyngeal swab specimens and  should not be used as a sole basis for treatment. Nasal washings and  aspirates are unacceptable for Xpert Xpress SARS-CoV-2/FLU/RSV  testing.  Fact Sheet for Patients: https://www.moore.com/  Fact Sheet for Healthcare Providers: https://www.young.biz/  This test is not yet approved or cleared by the [place] FDA and  has been authorized for detection and/or diagnosis of SARS-CoV-2 by  FDA under an Emergency Use Authorization (EUA). This EUA will remain  in effect (meaning this test can be used) for the duration of the  Covid-19 declaration under Section 564(b)(1) of the Act, 21  U.S.C. section 360bbb-3(b)(1), unless the authorization is  terminated or  revoked. Performed at [facility], [address] [place], [place] [address]     Medications:  Current Facility-Administered Medications  Medication Dose Route Frequency Provider Last Rate Last Admin  . acetaminophen (TYLENOL) tablet 650 mg  650 mg Oral Q6H PRN Charlynne Pander, MD   650 mg at 08/27/20 2320  . LORazepam (ATIVAN) tablet 1 mg  1 mg Oral Q4H PRN Charlynne Pander, MD   1 mg at 08/27/20 2320  . zolpidem (AMBIEN) tablet 5 mg  5 mg Oral QHS PRN Charlynne Pander, MD   5 mg at 08/28/20 0021   Current Outpatient Medications  Medication Sig Dispense Refill  . albuterol (PROVENTIL HFA;VENTOLIN HFA) 108 (90 BASE) MCG/ACT inhaler Inhale 2 puffs into the lungs every 6 (six) hours as needed for wheezing or shortness of breath. 1 Inhaler 2  . cyclobenzaprine (FLEXERIL) 10 MG tablet Take 1 tablet (10 mg total) by mouth 3 (three) times daily as needed for muscle spasms. 30 tablet 0  . ibuprofen (ADVIL) 600 MG tablet Take 1 tablet (600 mg total) by mouth every 6 (six) hours as needed. 30 tablet 0  . oxyCODONE-acetaminophen (PERCOCET/ROXICET) 5-325 MG tablet Take 1-2 tablets by mouth every 4 (four) hours as needed for severe pain. 8 tablet 0   Musculoskeletal: Strength & Muscle Tone: within normal limits Gait & Station: normal Patient leans: N/A  Psychiatric Specialty Exam: Physical Exam Psychiatric:        Attention and Perception: She is inattentive.        Mood and Affect: Mood is anxious. Affect is labile.        Speech: Speech is tangential.        Behavior: Behavior is agitated.        Thought Content: Thought content normal.        Cognition and Memory: Cognition and memory normal.        Judgment: Judgment is impulsive.     Review of Systems  Psychiatric/Behavioral: Positive for agitation.    Blood pressure 104/69, pulse 62, temperature 98.1 F (36.7 C), temperature source Oral, resp. rate 16, height 5\' 4"  (1.626 m), weight 59 kg, last menstrual period  08/25/2020, SpO2 98 %, unknown if currently breastfeeding.Body mass index is 22.33 kg/m.  General Appearance: Disheveled  Eye Contact:  Fair  Speech:  Clear and Coherent  Volume:  Increased  Mood:  Irritable  Affect:  Labile  Thought Process:  Coherent and Goal Directed  Orientation:  Full (Time, Place, and Person)  Thought Content:  Logical and Tangential  Suicidal Thoughts:  No  Homicidal Thoughts:  No  Memory:  Immediate;   Fair Recent;   Fair Remote;   Fair  Judgement:  Poor  Insight:  Lacking  Psychomotor Activity:  NA  Concentration:  Concentration: Fair and Attention Span: Poor  Recall:  [employer] of Knowledge:  Fair  Language:  Fair  Akathisia:  NA  Handed:  Right  AIMS (if indicated):     Assets:  Housing Physical Health Resilience Social Support  ADL's:  Intact  Cognition:  WNL  Sleep:      Treatment Plan Summary: Plan discharge patient home to family with outpatient resources for medication management and therapy follow-up.   Disposition: No evidence of imminent risk to self or others at present.   Patient does not meet criteria for psychiatric inpatient admission. Supportive therapy provided about ongoing stressors. Discussed crisis plan, support from social network, calling 911, coming to the Emergency Department, and calling Suicide Hotline. Spoke to patient's grandmother at length who felt patient was on agitated because of being in the hospital and feels patient is not a threat to herself or others. Patient is denying any active suicidal/homicidal ideations, auditory/visual hallucinations, and does not appear to be responding to any external/internal stimuli at the time of my assessment. ED Social Work consulted for outpatient resources.   This service was provided via telemedicine using a 2-way, interactive audio and video technology.  Names of all persons participating in this telemedicine service and their role in this encounter. Name: Maxie Barb Role: PMHNP  Name: Nelly Rout Role: Attending MD  Name: Patricia Dunn Role: patient  Name: Patricia Dunn Role: grandmother    Loletta Parish, NP 08/29/2020 4:16 PM

## 2020-08-29 NOTE — ED Provider Notes (Signed)
BH team has re-assessed and indicates pt is psych cleared for discharge.   Pt alert, content, no distress.   Pt indicates she also feels ready for d/c.   Pt with normal mood/affect. No thoughts of self harm or SI.   Patient currently appears stable for d/c.   Return precautions provided.      Cathren Laine, MD 08/29/20 [phone]

## 2020-08-29 NOTE — ED Notes (Signed)
Pt quick to be highly agitated. Screaming/yelling at staff. Pt states she was told she was discharged this morning.

## 2020-08-29 NOTE — ED Notes (Signed)
Pt on phone with tts. Pt speaking loudly and is agitated.

## 2020-08-29 NOTE — ED Notes (Signed)
Sitter attempted to get morning vitals; pt again questioned what time she would be discharged; pt states she will have vitals taken when she is discharged-Monique,RN

## 2020-08-29 NOTE — ED Provider Notes (Signed)
Emergency Medicine Observation Re-evaluation Note  Patricia Dunn is a 34 y.o. female, seen on rounds today.  Pt initially presented to the ED for complaints of Suicide Attempt and IVC Currently, the patient is in the shower.  Patient thinks that she is getting discharged this morning, is easily agitated according to nurses.  Geodon had to be ordered yesterday in regards to patient's agitation.  Patient has been refusing vitals.  According to nursing staff and notes, patient has been aggressive towards nursing staff all night.  As needed Ativan ordered.  Physical Exam  BP 98/61 (BP Location: Right Arm)    Pulse 61    Temp 98.4 F (36.9 C) (Oral)    Resp 13    Ht 5\' 4"  (1.626 m)    Wt 59 kg    LMP 08/25/2020    SpO2 100%    BMI 22.33 kg/m  Physical Exam General: Patient is in shower, however was able to watch patient walk across room into the shower. Cardiac: Regular rhythm Lungs: No respiratory distress, no labored breathing on walking Psych: Patient is in the shower, however according to nursing she isAgitated.  ED Course / MDM  EKG:    I have reviewed the labs performed to date as well as medications administered while in observation.  Recent changes in the last 24 hours include as needed Ativan.  Plan  Current plan is for reevaluation of psychiatry team this morning, last note from yesterday recommended observation in the ED for overnight safety and then reevaluate in the a.m. Patient is under full IVC at this time.   13/07/2020, PA-C 08/29/20 0848    08/31/20, MD 08/29/20 [phone]

## 2020-08-29 NOTE — ED Notes (Signed)
Staff was able to get pt back in scrubs. Pt sitting in chair in room at this time.

## 2020-08-29 NOTE — ED Notes (Signed)
Pt offered PO medication and refused. Pt sitting in room completely naked, refusing to cover up. Pt had to be restrained to administer medication. Pt kicked this RN while this RN attempted to give her an IM medication. [employer] and security remain at the bedside.

## 2021-05-19 ENCOUNTER — Encounter (HOSPITAL_COMMUNITY): Payer: Self-pay

## 2021-05-19 ENCOUNTER — Emergency Department (HOSPITAL_COMMUNITY)
Admission: EM | Admit: 2021-05-19 | Discharge: 2021-05-19 | Discharge status: Against Medical Advice | Payer: Self-pay | Attending: Emergency Medicine | Admitting: Emergency Medicine

## 2021-05-19 DIAGNOSIS — Z20822 Contact with and (suspected) exposure to covid-19: Secondary | ICD-10-CM | POA: Insufficient documentation

## 2021-05-19 DIAGNOSIS — M7918 Myalgia, other site: Secondary | ICD-10-CM | POA: Insufficient documentation

## 2021-05-19 DIAGNOSIS — R509 Fever, unspecified: Secondary | ICD-10-CM | POA: Insufficient documentation

## 2021-05-19 DIAGNOSIS — Z5321 Procedure and treatment not carried out due to patient leaving prior to being seen by health care provider: Secondary | ICD-10-CM | POA: Insufficient documentation

## 2021-05-19 LAB — COMPREHENSIVE METABOLIC PANEL
ALT: 11 U/L (ref 0–44)
AST: 17 U/L (ref 15–41)
Albumin: 3.1 g/dL — ABNORMAL LOW (ref 3.5–5.0)
Alkaline Phosphatase: 66 U/L (ref 38–126)
Anion gap: 9 (ref 5–15)
BUN: 7 mg/dL (ref 6–20)
CO2: 26 mmol/L (ref 22–32)
Calcium: 9.3 mg/dL (ref 8.9–10.3)
Chloride: 100 mmol/L (ref 98–111)
Creatinine, Ser: 0.82 mg/dL (ref 0.44–1.00)
GFR, Estimated: >60 mL/min (ref >60)
Glucose, Bld: 90 mg/dL (ref 70–99)
Potassium: 4.2 mmol/L (ref 3.5–5.1)
Sodium: 135 mmol/L (ref 135–145)
Total Bilirubin: 0.5 mg/dL (ref 0.3–1.2)
Total Protein: 7 g/dL (ref 6.5–8.1)

## 2021-05-19 LAB — LIPASE, BLOOD: Lipase: 21 U/L (ref 11–51)

## 2021-05-19 LAB — CBC WITH DIFFERENTIAL/PLATELET
Abs Immature Granulocytes: 0.03 10*3/uL (ref 0.00–0.07)
Basophils Absolute: 0 10*3/uL (ref 0.0–0.1)
Basophils Relative: 0 %
Eosinophils Absolute: 0.2 10*3/uL (ref 0.0–0.5)
Eosinophils Relative: 2 %
HCT: 42.8 % (ref 36.0–46.0)
Hemoglobin: 13.4 g/dL (ref 12.0–15.0)
Immature Granulocytes: 0 %
Lymphocytes Relative: 19 %
Lymphs Abs: 1.8 10*3/uL (ref 0.7–4.0)
MCH: 28.1 pg (ref 26.0–34.0)
MCHC: 31.3 g/dL (ref 30.0–36.0)
MCV: 89.7 fL (ref 80.0–100.0)
Monocytes Absolute: 1 10*3/uL (ref 0.1–1.0)
Monocytes Relative: 11 %
Neutro Abs: 6.4 10*3/uL (ref 1.7–7.7)
Neutrophils Relative %: 68 %
Platelets: 322 10*3/uL (ref 150–400)
RBC: 4.77 MIL/uL (ref 3.87–5.11)
RDW: 13.2 % (ref 11.5–15.5)
WBC: 9.4 10*3/uL (ref 4.0–10.5)
nRBC: 0 % (ref 0.0–0.2)

## 2021-05-19 LAB — RESP PANEL BY RT-PCR (FLU A&B, COVID) ARPGX2
Influenza A by PCR: NEGATIVE
Influenza B by PCR: NEGATIVE
SARS Coronavirus 2 by RT PCR: NEGATIVE

## 2021-05-19 NOTE — ED Provider Notes (Signed)
Emergency Medicine Provider Triage Evaluation Note  Patricia Dunn , a 35 y.o. female  was evaluated in triage.  Pt complains of body aches.  The patient reports that she had nausea, vomiting, and diarrhea several days ago that resolved before she developed chills, subjective fever, abdominal pain, and body aches 2 days ago.  She denies cough, nasal congestion, rhinorrhea, chest pain, shortness of breath, rash.  She also reports that she has been urinating more frequently.  No treatment prior to arrival.  She states that she works at [employer] and is unsure if she has been exposed to COVID-19.  She denies IV drug use.  Review of Systems  Positive: Body aches, fever, chills, nausea, vomiting, diarrhea, urinary frequency, abdominal pain Negative: Chest pain, shortness of breath, cough, nasal congestion, rhinorrhea, rash  Physical Exam  BP 113/73 (BP Location: Right Arm)   Pulse 89   Temp 99.1 F (37.3 C) (Oral)   Resp 16   SpO2 99%  Gen:   Awake, no distress   Resp:  Normal effort  MSK:   Moves extremities without difficulty  Other:  Appears intoxicated, tender to palpation in the upper abdomen.  Abdomen is not distended.  No rebound or guarding.  Medical Decision Making  Medically screening exam initiated at 2:31 AM.  Appropriate orders placed.  KEREN ALVERIO was informed that the remainder of the evaluation will be completed by another provider, this initial triage assessment does not replace that evaluation, and the importance of remaining in the ED until their evaluation is complete.  Labs and COVID-19 testing have been ordered in triage.  Patient will require further work-up and evaluation in the emergency department.   Barkley Boards, PA-C 05/19/21 0253    Glynn Octave, MD 05/19/21 [phone]

## 2021-05-19 NOTE — ED Notes (Signed)
Patient called for vitals recheck x1 with no response 

## 2021-05-19 NOTE — ED Triage Notes (Signed)
Pt covid like symptoms for the past two days, body aches and fevers.

## 2021-05-19 NOTE — ED Notes (Signed)
Pt called for vitals x2 with no response and not visible in lobby

## 2021-08-05 ENCOUNTER — Emergency Department (HOSPITAL_COMMUNITY): Payer: Medicaid Other

## 2021-08-05 ENCOUNTER — Other Ambulatory Visit: Payer: Self-pay

## 2021-08-05 ENCOUNTER — Emergency Department (HOSPITAL_COMMUNITY)
Admission: EM | Admit: 2021-08-05 | Discharge: 2021-08-08 | Disposition: A | Discharge status: Home / Self Care | Payer: Medicaid Other | Attending: Emergency Medicine | Admitting: Emergency Medicine

## 2021-08-05 ENCOUNTER — Encounter (HOSPITAL_COMMUNITY): Payer: Self-pay | Admitting: Emergency Medicine

## 2021-08-05 DIAGNOSIS — Z79899 Other long term (current) drug therapy: Secondary | ICD-10-CM | POA: Insufficient documentation

## 2021-08-05 DIAGNOSIS — F1924 Other psychoactive substance dependence with psychoactive substance-induced mood disorder: Secondary | ICD-10-CM | POA: Insufficient documentation

## 2021-08-05 DIAGNOSIS — Z20822 Contact with and (suspected) exposure to covid-19: Secondary | ICD-10-CM | POA: Insufficient documentation

## 2021-08-05 DIAGNOSIS — R4182 Altered mental status, unspecified: Secondary | ICD-10-CM | POA: Insufficient documentation

## 2021-08-05 DIAGNOSIS — X838XXA Intentional self-harm by other specified means, initial encounter: Secondary | ICD-10-CM | POA: Insufficient documentation

## 2021-08-05 DIAGNOSIS — F332 Major depressive disorder, recurrent severe without psychotic features: Secondary | ICD-10-CM | POA: Insufficient documentation

## 2021-08-05 DIAGNOSIS — Y906 Blood alcohol level of 120-199 mg/100 ml: Secondary | ICD-10-CM | POA: Insufficient documentation

## 2021-08-05 DIAGNOSIS — T1491XA Suicide attempt, initial encounter: Secondary | ICD-10-CM

## 2021-08-05 DIAGNOSIS — F1994 Other psychoactive substance use, unspecified with psychoactive substance-induced mood disorder: Secondary | ICD-10-CM

## 2021-08-05 DIAGNOSIS — N9489 Other specified conditions associated with female genital organs and menstrual cycle: Secondary | ICD-10-CM | POA: Insufficient documentation

## 2021-08-05 DIAGNOSIS — F17210 Nicotine dependence, cigarettes, uncomplicated: Secondary | ICD-10-CM | POA: Insufficient documentation

## 2021-08-05 DIAGNOSIS — J45909 Unspecified asthma, uncomplicated: Secondary | ICD-10-CM | POA: Insufficient documentation

## 2021-08-05 LAB — CBC WITH DIFFERENTIAL/PLATELET
Abs Immature Granulocytes: 0.02 10*3/uL (ref 0.00–0.07)
Basophils Absolute: 0 10*3/uL (ref 0.0–0.1)
Basophils Relative: 1 %
Eosinophils Absolute: 0.6 10*3/uL — ABNORMAL HIGH (ref 0.0–0.5)
Eosinophils Relative: 8 %
HCT: 44.2 % (ref 36.0–46.0)
Hemoglobin: 14.2 g/dL (ref 12.0–15.0)
Immature Granulocytes: 0 %
Lymphocytes Relative: 29 %
Lymphs Abs: 2.3 10*3/uL (ref 0.7–4.0)
MCH: 28.8 pg (ref 26.0–34.0)
MCHC: 32.1 g/dL (ref 30.0–36.0)
MCV: 89.7 fL (ref 80.0–100.0)
Monocytes Absolute: 0.5 10*3/uL (ref 0.1–1.0)
Monocytes Relative: 6 %
Neutro Abs: 4.7 10*3/uL (ref 1.7–7.7)
Neutrophils Relative %: 56 %
Platelets: 304 10*3/uL (ref 150–400)
RBC: 4.93 MIL/uL (ref 3.87–5.11)
RDW: 13.3 % (ref 11.5–15.5)
WBC: 8.2 10*3/uL (ref 4.0–10.5)
nRBC: 0 % (ref 0.0–0.2)

## 2021-08-05 LAB — ETHANOL: Alcohol, Ethyl (B): 150 mg/dL — ABNORMAL HIGH (ref <10)

## 2021-08-05 LAB — COMPREHENSIVE METABOLIC PANEL
ALT: 17 U/L (ref 0–44)
AST: 24 U/L (ref 15–41)
Albumin: 4.2 g/dL (ref 3.5–5.0)
Alkaline Phosphatase: 79 U/L (ref 38–126)
Anion gap: 14 (ref 5–15)
BUN: 12 mg/dL (ref 6–20)
CO2: 22 mmol/L (ref 22–32)
Calcium: 8.9 mg/dL (ref 8.9–10.3)
Chloride: 106 mmol/L (ref 98–111)
Creatinine, Ser: 1.14 mg/dL — ABNORMAL HIGH (ref 0.44–1.00)
GFR, Estimated: >60 mL/min (ref >60)
Glucose, Bld: 89 mg/dL (ref 70–99)
Potassium: 3.1 mmol/L — ABNORMAL LOW (ref 3.5–5.1)
Sodium: 142 mmol/L (ref 135–145)
Total Bilirubin: 0.4 mg/dL (ref 0.3–1.2)
Total Protein: 7.8 g/dL (ref 6.5–8.1)

## 2021-08-05 LAB — ACETAMINOPHEN LEVEL: Acetaminophen (Tylenol), Serum: <10 ug/mL — ABNORMAL LOW (ref 10–30)

## 2021-08-05 LAB — RESP PANEL BY RT-PCR (FLU A&B, COVID) ARPGX2
Influenza A by PCR: NEGATIVE
Influenza B by PCR: NEGATIVE
SARS Coronavirus 2 by RT PCR: NEGATIVE

## 2021-08-05 LAB — CBG MONITORING, ED: Glucose-Capillary: 86 mg/dL (ref 70–99)

## 2021-08-05 LAB — I-STAT BETA HCG BLOOD, ED (MC, WL, AP ONLY): I-stat hCG, quantitative: <5 m[IU]/mL (ref <5)

## 2021-08-05 LAB — SALICYLATE LEVEL: Salicylate Lvl: <7 mg/dL — ABNORMAL LOW (ref 7.0–30.0)

## 2021-08-05 NOTE — ED Triage Notes (Signed)
Pt arrived via EMS. Pt was attempting to slit her wrists and hitting her head against the concrete. Pt was violent with EMS and attempting to spit on officers. Pt was given 5mg  of haldol and 5mg  of versed by EMS. Pt is currently restrained by EMS. Pt is being IVC'd by [employer]

## 2021-08-05 NOTE — ED Notes (Signed)
Pt sleeping slouched in bed, equal unlabored respirations noted. [employer] at bedside.

## 2021-08-05 NOTE — ED Provider Notes (Signed)
Assumed care from Roxy Horseman, PA-C at shift change pending labs and evaluation.  See his note for full HPI.  In short, patient is a 35 year old female under IVC for suicidal ideations and aggressive behavior.  Per EMS, patient was found banging her head on concrete attempting to commit suicide.  During my evaluation after 5 hours in the ED patient still not answer any questions: However, no longer appears to be drowsy from Versed and Haldol. level 5 caveat secondary to psychiatric illness.  Physical Exam  BP 111/89 (BP Location: Right Arm)   Pulse 89   Temp 97.7 F (36.5 C) (Oral)   Resp (!) 27   Ht 5\' 4"  (1.626 m)   Wt 59 kg   SpO2 96%   BMI 22.33 kg/m   Physical Exam Vitals and nursing note reviewed.  Constitutional:      General: She is not in acute distress.    Appearance: She is not ill-appearing.  HENT:     Head: Normocephalic.  Eyes:     Pupils: Pupils are equal, round, and reactive to light.  Cardiovascular:     Rate and Rhythm: Normal rate and regular rhythm.     Pulses: Normal pulses.     Heart sounds: Normal heart sounds. No murmur heard.   No friction rub. No gallop.  Pulmonary:     Effort: Pulmonary effort is normal.     Breath sounds: Normal breath sounds.  Abdominal:     General: Abdomen is flat. There is no distension.     Palpations: Abdomen is soft.     Tenderness: There is no abdominal tenderness. There is no guarding or rebound.  Musculoskeletal:        General: Normal range of motion.     Cervical back: Neck supple.  Skin:    General: Skin is warm and dry.  Neurological:     General: No focal deficit present.     Mental Status: She is alert.  Psychiatric:        Mood and Affect: Mood normal.        Behavior: Behavior normal.    ED Course/Procedures    Results for orders placed or performed during the hospital encounter of 08/05/21 (from the past 24 hour(s))  Comprehensive metabolic panel     Status: Abnormal   Collection Time: 08/05/21   5:17 AM  Result Value Ref Range   Sodium 142 135 - 145 mmol/L   Potassium 3.1 (L) 3.5 - 5.1 mmol/L   Chloride 106 98 - 111 mmol/L   CO2 22 22 - 32 mmol/L   Glucose, Bld 89 70 - 99 mg/dL   BUN 12 6 - 20 mg/dL   Creatinine, Ser 08/07/21 (H) 0.44 - 1.00 mg/dL   Calcium 8.9 8.9 - 3.15 mg/dL   Total Protein 7.8 6.5 - 8.1 g/dL   Albumin 4.2 3.5 - 5.0 g/dL   AST 24 15 - 41 U/L   ALT 17 0 - 44 U/L   Alkaline Phosphatase 79 38 - 126 U/L   Total Bilirubin 0.4 0.3 - 1.2 mg/dL   GFR, Estimated 94.5 >85 mL/min   Anion gap 14 5 - 15  Salicylate level     Status: Abnormal   Collection Time: 08/05/21  5:17 AM  Result Value Ref Range   Salicylate Lvl <7.0 (L) 7.0 - 30.0 mg/dL  Acetaminophen level     Status: Abnormal   Collection Time: 08/05/21  5:17 AM  Result Value Ref Range  Acetaminophen (Tylenol), Serum <10 (L) 10 - 30 ug/mL  Ethanol     Status: Abnormal   Collection Time: 08/05/21  5:17 AM  Result Value Ref Range   Alcohol, Ethyl (B) 150 (H) <10 mg/dL  CBC WITH DIFFERENTIAL     Status: Abnormal   Collection Time: 08/05/21  5:17 AM  Result Value Ref Range   WBC 8.2 4.0 - 10.5 K/uL   RBC 4.93 3.87 - 5.11 MIL/uL   Hemoglobin 14.2 12.0 - 15.0 g/dL   HCT 12.2 48.2 - 50.0 %   MCV 89.7 80.0 - 100.0 fL   MCH 28.8 26.0 - 34.0 pg   MCHC 32.1 30.0 - 36.0 g/dL   RDW 37.0 48.8 - 89.1 %   Platelets 304 150 - 400 K/uL   nRBC 0.0 0.0 - 0.2 %   Neutrophils Relative % 56 %   Neutro Abs 4.7 1.7 - 7.7 K/uL   Lymphocytes Relative 29 %   Lymphs Abs 2.3 0.7 - 4.0 K/uL   Monocytes Relative 6 %   Monocytes Absolute 0.5 0.1 - 1.0 K/uL   Eosinophils Relative 8 %   Eosinophils Absolute 0.6 (H) 0.0 - 0.5 K/uL   Basophils Relative 1 %   Basophils Absolute 0.0 0.0 - 0.1 K/uL   Immature Granulocytes 0 %   Abs Immature Granulocytes 0.02 0.00 - 0.07 K/uL  CBG monitoring, ED     Status: None   Collection Time: 08/05/21  5:23 AM  Result Value Ref Range   Glucose-Capillary 86 70 - 99 mg/dL  Resp Panel by  RT-PCR (Flu A&B, Covid) Nasopharyngeal Swab     Status: None   Collection Time: 08/05/21  5:29 AM   Specimen: Nasopharyngeal Swab; Nasopharyngeal(NP) swabs in vial transport medium  Result Value Ref Range   SARS Coronavirus 2 by RT PCR NEGATIVE NEGATIVE   Influenza A by PCR NEGATIVE NEGATIVE   Influenza B by PCR NEGATIVE NEGATIVE  I-Stat beta hCG blood, ED     Status: None   Collection Time: 08/05/21  5:36 AM  Result Value Ref Range   I-stat hCG, quantitative <5.0 <5 mIU/mL   Comment 3            Procedures  MDM   35 year old female presents to the ED under IVC due to suicidal ideations and aggressive behavior.  Per EMS report, patient attempted suicide by her head on concrete.  Medical clearance labs pending at shift change.  CBC unremarkable.  No leukocytosis and normal hemoglobin.  CMP significant for mild elevation creatinine 1.14 and hypokalemia 3.1.  Elevated ethanol of 150.  Acetaminophen and salicylate level within normal limits.  Pregnancy test negative.  COVID/influenza negative.  CT head personally reviewed which is negative for any acute abnormalities.  CT spine negative for any bony fractures however, does demonstrate possible aspiration.  Normal oxygen saturations. Maintaining airway. No signs of respiratory distress.  7:29 AM reassessed patient at bedside.  Patient still extremely drowsy.  Patient opens eyes to voice.  Will continue to monitor.  9:00 AM reassessed patient at bedside.  Patient continues to be drowsy however, slightly less than previous evaluation.  Patient able to open eyes and answer some questions.  Still unable to obtain full HPI.  We will continue to monitor.  10:25 AM reassessed patient at bedside.  Patient not willing to answer any questions.  Patient awake and alert.  Patient does not appear to be drowsy from Versed and haloperidol.  Patient denies SI, HI, and  auditory/visual hallucinations however, very difficult to obtain any answers from patient.  Patient medically cleared for TTS evaluation.      Mannie Stabile, PA-C 08/05/21 1033    Gilda Crease, MD 08/06/21 [phone]

## 2021-08-05 NOTE — ED Notes (Signed)
Left leg restraint removed.

## 2021-08-05 NOTE — BH Assessment (Addendum)
Comprehensive Clinical Assessment (CCA) Note  08/05/2021 Patricia Dunn [id number]  Disposition: Per Maxie Barb, NP, patient meets criteria for inpatient psychiatrist treatment. Disposition LCSW/Counselor to seek appropriate placement. [place]-SUICIDE SEVERITY RATING SCALE (C-SSRS) completed and patient scored, "High Risk". Therefore, 1-1 sitter precautions are recommended for this patient. Disposition Counselor, Nursing, and EDP provided disposition/sitter precaution updates.   Flowsheet Row ED from 08/05/2021 in [facility] from 05/19/2021 in [facility]ERGENC[facility] DEPARTMENT ED from 08/27/2020 in [facility] EMERGENCY DEPARTMENT  C-SSRS RISK CATEGORY High Risk No Risk High Risk      The patient demonstrates the following risk factors for suicide: Chronic risk factors for suicide include: Major Depressive Disorder, Recurrent, Severe, w/o psychotic features. Acute risk factors for suicide include:  unknown; patient refused to participate in today's TTS assessment . Protective factors for this patient include:  unknown; patient refused to participate in today's TTS assessment . Considering these factors, the overall suicide risk at this point appears to be high. Patient is not appropriate for outpatient follow up.   Chief Complaint:  Chief Complaint  Patient presents with   IVC   Psychiatric Evaluation   Visit Diagnosis: Major Depressive Disorder, Recurrent, Severe, w/o psychotic features     @ 0850, attempted to completed patient's TTS assessment. However, per nursing Eber Jones, RN) patient still sleeping. Nursing will notify TTS when patient awakens.    @1025 , received notice that the PA believes patient is ready to be seen. However, Clinician not available at the moment due to a [facility] walk in.    @1240 , Clinician available to complete patient's TTS assessment. Machine was placed in the room. However, patient  would not participate in the assessment. Clinician made several efforts to call patient's name and request that she participate in today's TTS assessment. She was observed with covers over her head. No eye contact at all. Therefore, Clinician unable to discern if patient is sleeping or purposely unresponsive. Carolynne, RN notified of patient's behaviors.    @1255 , Notified the [facility] provider , NP) of patient's behaviors. The [facility] provider will make an attempt to see patient face to face in order to encourage better cooperation from patient.   Per [facility] provider note 08/05/2021:  "Patricia Dunn is a 35 y.o. female patient admitted with psychotic and suicidal behaviors after patient bought in by EMS after attempting to slit her wrist and hitting her head against the concrete. On assessment patient observed yelling at officers nearby. Refusing to participate in TTS assessment or any patient care by hospital staff. Mood labile. Speech pressured and tangential. She mentions her grandmother being in the hospital [facility] room 8) "about to die"; she is disorganized and appears to be responding to internal stimuli. Provider attempted to obtain family (aunt Teriyah Purington) information; patient states she didn't know it by heart and after searching personal items cellphone was not found. UDS remains outstanding due to patient refusal. BAL 150. Recommendation for inpatient hospitalization noted."  CCA Screening, Triage and Referral (STR)  Patient Reported Information How did you hear about 32? No data recorded What Is the Reason for Your Visit/Call Today? Unknown. Patient refused to participte in today's assessment. Pt arrived via EMS. Pt was attempting to slit her wrists and hitting her head against the concrete. Pt was violent with EMS and attempting to spit on officers. Pt was given 5mg  of haldol and 5mg  of versed by EMS. Pt is currently restrained by EMS. Pt  is being IVC'd by [employer]  How  Long Has This Been Causing You Problems? -- (Unknown. Patient refused to participte in today's assessment.)  What Do You Feel Would Help You the Most Today? -- (Unknown. Patient refused to participte in today's assessment.)   Have You Recently Had Any Thoughts About Hurting Yourself? Yes (. Per ED notes: Pt arrived via EMS. Pt was attempting to slit her wrists and hitting her head against the concrete. Pt was violent with EMS and attempting to spit on officers. Pt was given 5mg  of haldol and 5mg  of versed by EMS.)  Are You Planning to Commit Suicide/Harm Yourself At This time? Yes (Pt arrived via EMS. Pt was attempting to slit her wrists and hitting her head against the concrete. Pt was violent with EMS and attempting to spit on officers. Pt was given 5mg  of haldol and 5mg  of versed by EMS.)   Have you Recently Had Thoughts About Hurting Someone Else? -- (Unknown. Patient refused to participte in today's assessment.)  Are You Planning to Harm Someone at This Time? -- (Unknown. Patient refused to participte in today's assessment.)  Explanation: No data recorded  Have You Used Any Alcohol or Drugs in the Past 24 Hours? -- (Unknown. Patient refused to participte in today's assessment.)  How Long Ago Did You Use Drugs or Alcohol? No data recorded What Did You Use and How Much? No data recorded  Do You Currently Have a Therapist/Psychiatrist? No data recorded Name of Therapist/Psychiatrist: No data recorded  Have You Been Recently Discharged From Any Office Practice or Programs? No data recorded Explanation of Discharge From Practice/Program: No data recorded    CCA Screening Triage Referral Assessment Type of Contact: Tele-Assessment  Telemedicine Service Delivery:   Is this Initial or Reassessment? Initial Assessment  Date Telepsych consult ordered in [facility]:  08/05/21  Time Telepsych consult ordered in [facility]:  No data recorded Location of Assessment: [facility] ED  Provider Location:  [facility]   Collateral Involvement: Unknown. Patient refused to participte in today's assessment.   Does Patient Have a Guardian? No data recorded Name and Contact of Legal Guardian: No data recorded If Minor and Not Living with Parent(s), Who has Custody? No data recorded Is CPS involved or ever been involved? -- (Unknown. Patient refused to participte in today's assessment.)  Is APS involved or ever been involved? -- (Unknown. Patient refused to participte in today's assessment.)   Patient Determined To Be At Risk for Harm To Self or Others Based on Review of Patient Reported Information or Presenting Complaint? -- (Unknown. Patient refused to participte in today's assessment.)  Method: No data recorded Availability of Means: No data recorded Intent: No data recorded Notification Required: No data recorded Additional Information for Danger to Others Potential: No data recorded Additional Comments for Danger to Others Potential: No data recorded Are There Guns or Other Weapons in Your Home? No data recorded Types of Guns/Weapons: No data recorded Are These Weapons Safely Secured?                            No data recorded Who Could Verify You Are Able To Have These Secured: No data recorded Do You Have any Outstanding Charges, Pending Court Dates, Parole/Probation? No data recorded Contacted To Inform of Risk of Harm To Self or Others: No data recorded   Does Patient Present under Involuntary Commitment? -- (Unknown. Patient refused to participte in  today's assessment.)  IVC Papers Initial File Date: No data recorded  [place] of Residence: [place]   Patient Currently Receiving the Following Services: -- (Unknown. Patient refused to participte in today's assessment.)   Determination of Need: Emergent (2 hours)   Options For Referral: -- (Unknown. Patient refused to participte in today's assessment.)     CCA  Biopsychosocial Patient Reported Schizophrenia/Schizoaffective Diagnosis in Past: -- (Unknown. Patient refused to participte in today's assessment.)   Strengths: Unknown. Patient refused to participte in today's assessment.   Mental Health Symptoms Depression:   -- (Unknown. Patient refused to participte in today's assessment.)   Duration of Depressive symptoms:    Mania:   -- (Unknown. Patient refused to participte in today's assessment.)   Anxiety:    -- (Unknown. Patient refused to participte in today's assessment.)   Psychosis:   -- (Unknown. Patient refused to participte in today's assessment.)   Duration of Psychotic symptoms:  Duration of Psychotic Symptoms: N/A   Trauma:   -- (Unknown. Patient refused to participte in today's assessment.)   Obsessions:   -- (Unknown. Patient refused to participte in today's assessment.)   Compulsions:   -- (Unknown. Patient refused to participte in today's assessment.)   Inattention:   -- (Unknown. Patient refused to participte in today's assessment.)   Hyperactivity/Impulsivity:   -- (Unknown. Patient refused to participte in today's assessment.)   Oppositional/Defiant Behaviors:   -- (Unknown. Patient refused to participte in today's assessment.)   Emotional Irregularity:   -- (Unknown. Patient refused to participte in today's assessment.)   Other Mood/Personality Symptoms:   Unknown. Patient refused to participte in today's assessment.    Mental Status Exam Appearance and self-care  Stature:   -- (Unknown. Patient refused to participte in today's assessment. Covered with blankets head to toe.)   Weight:   -- (Unknown. Patient refused to participte in today's assessment.)   Clothing:   -- (Unknown. Patient refused to participte in today's assessment. Patient covered with blankets head to toe.)   Grooming:   -- (Unknown. Patient refused to participte in today's assessment.)   Cosmetic use:   -- (Unknown.  Patient refused to participte in today's assessment.)   Posture/gait:   -- (Unknown. Patient refused to participte in today's assessment.)   Motor activity:   -- (Unknown. Patient refused to participte in today's assessment.)   Sensorium  Attention:   Inattentive (Unknown. Patient refused to participte in today's assessment.)   Concentration:   -- (Unknown. Patient refused to participte in today's assessment.)   Orientation:   -- (Unknown. Patient refused to participte in today's assessment.)   Recall/memory:   -- (Unknown. Patient refused to participte in today's assessment.)   Affect and Mood  Affect:   Other (Comment) (Unknown. Patient refused to participte in today's assessment. Unable to see patient's affect. Head covered with blankets.)   Mood:   Irritable   Relating  Eye contact:   None (Unknown. Patient refused to participte in today's assessment.)   Facial expression:   -- (Unknown. Patient refused to participte in today's assessment.)   Attitude toward examiner:   Uninterested; Guarded (Unknown. Patient refused to participte in today's assessment.)   Thought and Language  Speech flow:  -- (Unknown. Patient refused to participte in today's assessment.)   Thought content:   -- (Unknown. Patient refused to participte in today's assessment.)   Preoccupation:   -- (Unknown. Patient refused to participte in today's assessment.)   Hallucinations:   -- (Unknown. Patient  refused to participte in today's assessment.)   Organization:  No data recorded  [employer] of Knowledge:   -- (Unknown. Patient refused to participte in today's assessment.)   Intelligence:   -- (Unknown. Patient refused to participte in today's assessment.)   Abstraction:   -- (Unknown. Patient refused to participte in today's assessment.)   Judgement:   -- (Unknown. Patient refused to participte in today's assessment.)   Reality Testing:   -- (Unknown. Patient  refused to participte in today's assessment.)   Insight:   -- (Unknown. Patient refused to participte in today's assessment.)   Decision Making:   Impulsive   Social Functioning  Social Maturity:   Impulsive   Social Judgement:   -- (Unknown. Patient refused to participte in today's assessment.)   Stress  Stressors:   -- (Unknown. Patient refused to participte in today's assessment.)   Coping Ability:   -- (Unknown. Patient refused to participte in today's assessment.)   Skill Deficits:   -- (Unknown. Patient refused to participte in today's assessment.)   Supports:   Family     Religion: Religion/Spirituality Are You A Religious Person?:  (Unknown. Patient refused to participte in today's assessment.) How Might This Affect Treatment?: Unknown. Patient refused to participte in today's assessment.  Leisure/Recreation: Leisure / Recreation Do You Have Hobbies?:  (Unknown. Patient refused to participte in today's assessment.)  Exercise/Diet: Exercise/Diet Have You Gained or Lost A Significant Amount of Weight in the Past Six Months?:  (Unknown. Patient refused to participte in today's assessment.) Do You Follow a Special Diet?:  (Unknown. Patient refused to participte in today's assessment.) Do You Have Any Trouble Sleeping?:  (Unknown. Patient refused to participte in today's assessment.)   CCA Employment/Education Employment/Work Situation: Employment / Work Situation Employment Situation: Employed Work Stressors: unemployed per chart review;Unknown. Patient refused to participte in today's assessment. Patient's Job has Been Impacted by Current Illness: No Has Patient ever Been in the [employer]?: No  Education: Education Is Patient Currently Attending School?: No Last Grade Completed: 12 Did You Attend College?: No Did You Have Any Difficulty At School?: No Patient's Education Has Been Impacted by Current Illness: No   CCA Family/Childhood History Family  and Relationship History: Family history Marital status:  (Unknown. Patient refused to participte in today's assessment.) Does patient have children?: Yes How many children?: 5 How is patient's relationship with their children?: living with other relatives, per chart review  Childhood History:  Childhood History By whom was/is the patient raised?: Grandparents (per chart review) Did patient suffer any verbal/emotional/physical/sexual abuse as a child?:  (Unknown. Patient refused to participte in today's assessment.) Did patient suffer from severe childhood neglect?:  (Unknown. Patient refused to participte in today's assessment.) Has patient ever been sexually abused/assaulted/raped as an adolescent or adult?:  (Unknown. Patient refused to participte in today's assessment.) Was the patient ever a victim of a crime or a disaster?:  (Unknown. Patient refused to participte in today's assessment.) Witnessed domestic violence?:  (Unknown. Patient refused to participte in today's assessment.) Has patient been affected by domestic violence as an adult?:  (Unknown. Patient refused to participte in today's assessment.)  Child/Adolescent Assessment:     CCA Substance Use Alcohol/Drug Use: Alcohol / Drug Use Pain Medications: see MAR Prescriptions: see MAR Over the Counter: see MAR History of alcohol / drug use?:  (Unknown. Patient refused to participte in today's assessment.) Longest period of sobriety (when/how long): polysubstance use- does not give specifics Withdrawal Symptoms:  (Unknown.  Patient refused to participte in today's assessment.)                         ASAM's:  Six Dimensions of Multidimensional Assessment  Dimension 1:  Acute Intoxication and/or Withdrawal Potential:      Dimension 2:  Biomedical Conditions and Complications:   Dimension 2:  Description of patient's biomedical conditions and  complications:  (Unknown. Patient refused to participte in today's  assessment.)  Dimension 3:  Emotional, Behavioral, or Cognitive Conditions and Complications:  Dimension 3:  Description of emotional, behavioral, or cognitive conditions and complications:  (Unknown. Patient refused to participte in today's assessment.)  Dimension 4:  Readiness to Change:  Dimension 4:  Description of Readiness to Change criteria:  (Unknown. Patient refused to participte in today's assessment.)  Dimension 5:  Relapse, Continued use, or Continued Problem Potential:  Dimension 5:  Relapse, continued use, or continued problem potential critiera description:  (Unknown. Patient refused to participte in today's assessment.)  Dimension 6:  Recovery/Living Environment:  Dimension 6:  Recovery/Iiving environment criteria description:  (Unknown. 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

## 2021-08-05 NOTE — ED Notes (Signed)
Right arm restraint removed

## 2021-08-05 NOTE — ED Notes (Signed)
Left wrist restraint removed. Pt easily awoken, falls back asleep quickly. Equal chest rise noted. VSS. [employer] at bedside. Will continue to monitor.

## 2021-08-05 NOTE — Progress Notes (Signed)
Inpatient Behavioral Health Placement  Pt meets inpatient criteria per Maxie Barb, NP Per [facility] Binnie Rail, RN there are no appropriate beds at [facility]. Referral was sent to the following facilities:  Destination Service Provider Address Phone Fax  [facility]  [address] [place] [address] [phone]  [facility]  [address], [place] [address] [phone]  [facility]  [address] [place], [place] [address] [phone]  [facility]  [phone] [address] [place] [address] [phone]  [facility]  [address] [place] [address] [phone]  [facility]  [address], [place] [address] [phone]  [facility]  [address], [place] [address] [phone]  [facility]  [address] [place] [address] [phone]  [facility]  [address] [place] [address] [phone]  [facility]  [address] [place] [address] [phone]  [facility]  [address], [place] [address] [phone]  [facility]  [address] [place] [address] [phone]  [facility]  [address] [place] [address] [phone]  [facility]  [address], [place] [address] [phone]  [facility]        Situation ongoing,  CSW will follow up.   Maryjean Ka, MSW, [facility] 08/05/2021  @ 9:42 PM

## 2021-08-05 NOTE — BH Assessment (Addendum)
TO BE SEEN  @ 0850, attempted to completed patient's TTS assessment. However, per nursing Eber Jones, RN) patient still sleeping. Nursing will notify TTS when patient awakens.   @1025 , received notice that the PA believes patient is ready to be seen. However, Clinician not available at the moment due to a [facility] walk in.   @1240 , Clinician available to complete patient's TTS assessment. Machine was placed in the room. However, patient would not participate in the assessment. Clinician made several efforts to call patient's name and request that she participate in today's TTS assessment. She was observed with covers over her head. No eye contact at all. Therefore, Clinician unable to discern if patient is sleeping or purposely unresponsive. Carolynne, RN notified of patient's behaviors.   @1255 , Notified the [facility] provider , NP) of patient's behaviors. The [facility] provider will make an attempt to see patient face to face in order to encourage better cooperation from patient.

## 2021-08-05 NOTE — Consult Note (Addendum)
[facility] Face-to-Face Psychiatry Consult   Reason for Consult:  SI Referring Physician:  Norman Clay, MD Patient Identification: Patricia Dunn MRN:  [id number] Principal Diagnosis: <principal problem not specified> Diagnosis:  Active Problems:   Substance induced mood disorder (HCC)   Total Time spent with patient: 20 minutes  Subjective:   Patricia Dunn is a 35 y.o. female patient admitted with psychotic and suicidal behaviors after patient bought in by EMS after attempting to slit her wrist and hitting her head against the concrete.   On assessment patient observed yelling at officers nearby. Refusing to participate in TTS assessment or any patient care by hospital staff. Mood labile. Speech pressured and tangential. She mentions her grandmother being in the hospital [facility] room 8) "about to die"; she is disorganized and appears to be responding to internal stimuli. Provider attempted to obtain family (aunt Japji Kok) information; patient states she didn't know it by heart and after searching personal items cellphone was not found. UDS remains outstanding due to patient refusal. BAL 150. Recommendation for inpatient hospitalization noted.   Past Psychiatric History: amphetamine use disorder, moderate, marijuana use, drug overdose, substance induce mood disorder  Risk to Self:  yes Risk to Others:  yes Prior Inpatient Therapy:  yes Prior Outpatient Therapy:  yes  Past Medical History:  Past Medical History:  Diagnosis Date   Asthma    Headache(784.0)    migraines   Smoker     Past Surgical History:  Procedure Laterality Date   NO PAST SURGERIES     TUBAL LIGATION  07/09/2012   Procedure: POST PARTUM TUBAL LIGATION;  Surgeon: Catalina Antigua, MD;  Location: [facility];  Service: Gynecology;  Laterality: Bilateral;   VAGINAL DELIVERY     X 4   Family History:  Family History  Problem Relation Age of Onset   Other Neg Hx    Family Psychiatric  History: not  noted Social History:  Social History   Substance and Sexual Activity  Alcohol Use Yes   Comment: Beginning of pregnancy     Social History   Substance and Sexual Activity  Drug Use Yes   Types: Marijuana   Comment: Last used 4 days ago. Uses twice a week. Denies using @ this time    Social History   Socioeconomic History   Marital status: Single    Spouse name: Not on file   Number of children: Not on file   Years of education: Not on file   Highest education level: Not on file  Occupational History   Not on file  Tobacco Use   Smoking status: Every Day    Packs/day: 1.00    Types: Cigarettes   Smokeless tobacco: Never  Substance and Sexual Activity   Alcohol use: Yes    Comment: Beginning of pregnancy   Drug use: Yes    Types: Marijuana    Comment: Last used 4 days ago. Uses twice a week. Denies using @ this time   Sexual activity: Yes    Birth control/protection: None    Comment: Last intercourse this AM  Other Topics Concern   Not on file  Social History Narrative   Not on file   Social Determinants of Health   Financial Resource Strain: Not on file  Food Insecurity: Not on file  Transportation Needs: Not on file  Physical Activity: Not on file  Stress: Not on file  Social Connections: Not on file   Additional Social History:  Allergies:  No Known Allergies  Labs:  Results for orders placed or performed during the hospital encounter of 08/05/21 (from the past 48 hour(s))  Comprehensive metabolic panel     Status: Abnormal   Collection Time: 08/05/21  5:17 AM  Result Value Ref Range   Sodium 142 135 - 145 mmol/L   Potassium 3.1 (L) 3.5 - 5.1 mmol/L   Chloride 106 98 - 111 mmol/L   CO2 22 22 - 32 mmol/L   Glucose, Bld 89 70 - 99 mg/dL    Comment: Glucose reference range applies only to samples taken after fasting for at least 8 hours.   BUN 12 6 - 20 mg/dL   Creatinine, Ser 1.02 (H) 0.44 - 1.00 mg/dL   Calcium 8.9 8.9 - 58.5 mg/dL   Total  Protein 7.8 6.5 - 8.1 g/dL   Albumin 4.2 3.5 - 5.0 g/dL   AST 24 15 - 41 U/L   ALT 17 0 - 44 U/L   Alkaline Phosphatase 79 38 - 126 U/L   Total Bilirubin 0.4 0.3 - 1.2 mg/dL   GFR, Estimated >27 >78 mL/min    Comment: (NOTE) Calculated using the CKD-EPI Creatinine Equation (2021)    Anion gap 14 5 - 15    Comment: Performed at [facility], [address], [place], [place] [address]  Salicylate level     Status: Abnormal   Collection Time: 08/05/21  5:17 AM  Result Value Ref Range   Salicylate Lvl <7.0 (L) 7.0 - 30.0 mg/dL    Comment: Performed at [facility], [address], [place], [place] [address]  Acetaminophen level     Status: Abnormal   Collection Time: 08/05/21  5:17 AM  Result Value Ref Range   Acetaminophen (Tylenol), Serum <10 (L) 10 - 30 ug/mL    Comment: (NOTE) Therapeutic concentrations vary significantly. A range of 10-30 ug/mL  may be an effective concentration for many patients. However, some  are best treated at concentrations outside of this range. Acetaminophen concentrations >150 ug/mL at 4 hours after ingestion  and >50 ug/mL at 12 hours after ingestion are often associated with  toxic reactions.  Performed at [facility], [address] [place], [place] [address]   Ethanol     Status: Abnormal   Collection Time: 08/05/21  5:17 AM  Result Value Ref Range   Alcohol, Ethyl (B) 150 (H) <10 mg/dL    Comment: (NOTE) Lowest detectable limit for serum alcohol is 10 mg/dL.  For medical purposes only. Performed at [facility], [address] [place], [place] [address]   CBC WITH DIFFERENTIAL     Status: Abnormal   Collection Time: 08/05/21  5:17 AM  Result Value Ref Range   WBC 8.2 4.0 - 10.5 K/uL   RBC 4.93 3.87 - 5.11 MIL/uL   Hemoglobin 14.2 12.0 - 15.0 g/dL   HCT 19.5 09.3 - 26.7 %   MCV 89.7 80.0 - 100.0 fL   MCH 28.8 26.0 - 34.0 pg   MCHC 32.1 30.0 - 36.0 g/dL    RDW 12.4 58.0 - 99.8 %   Platelets 304 150 - 400 K/uL   nRBC 0.0 0.0 - 0.2 %   Neutrophils Relative % 56 %   Neutro Abs 4.7 1.7 - 7.7 K/uL   Lymphocytes Relative 29 %   Lymphs Abs 2.3 0.7 - 4.0 K/uL   Monocytes Relative 6 %   Monocytes Absolute 0.5 0.1 -  1.0 K/uL   Eosinophils Relative 8 %   Eosinophils Absolute 0.6 (H) 0.0 - 0.5 K/uL   Basophils Relative 1 %   Basophils Absolute 0.0 0.0 - 0.1 K/uL   Immature Granulocytes 0 %   Abs Immature Granulocytes 0.02 0.00 - 0.07 K/uL    Comment: Performed at [facility], [address], [place], [place] [address]  CBG monitoring, ED     Status: None   Collection Time: 08/05/21  5:23 AM  Result Value Ref Range   Glucose-Capillary 86 70 - 99 mg/dL    Comment: Glucose reference range applies only to samples taken after fasting for at least 8 hours.  Resp Panel by RT-PCR (Flu A&B, Covid) Nasopharyngeal Swab     Status: None   Collection Time: 08/05/21  5:29 AM   Specimen: Nasopharyngeal Swab; Nasopharyngeal(NP) swabs in vial transport medium  Result Value Ref Range   SARS Coronavirus 2 by RT PCR NEGATIVE NEGATIVE    Comment: (NOTE) SARS-CoV-2 target nucleic acids are NOT DETECTED.  The SARS-CoV-2 RNA is generally detectable in upper respiratory specimens during the acute phase of infection. The lowest concentration of SARS-CoV-2 viral copies this assay can detect is 138 copies/mL. A negative result does not preclude SARS-Cov-2 infection and should not be used as the sole basis for treatment or other patient management decisions. A negative result may occur with  improper specimen collection/handling, submission of specimen other than nasopharyngeal swab, presence of viral mutation(s) within the areas targeted by this assay, and inadequate number of viral copies(<138 copies/mL). A negative result must be combined with clinical observations, patient history, and epidemiological information. The expected result is  Negative.  Fact Sheet for Patients:  BloggerCourse.com  Fact Sheet for Healthcare Providers:  SeriousBroker.it  This test is no t yet approved or cleared by the [place] FDA and  has been authorized for detection and/or diagnosis of SARS-CoV-2 by FDA under an Emergency Use Authorization (EUA). This EUA will remain  in effect (meaning this test can be used) for the duration of the COVID-19 declaration under Section 564(b)(1) of the Act, 21 U.S.C.section 360bbb-3(b)(1), unless the authorization is terminated  or revoked sooner.       Influenza A by PCR NEGATIVE NEGATIVE   Influenza B by PCR NEGATIVE NEGATIVE    Comment: (NOTE) The Xpert Xpress SARS-CoV-2/FLU/RSV plus assay is intended as an aid in the diagnosis of influenza from Nasopharyngeal swab specimens and should not be used as a sole basis for treatment. Nasal washings and aspirates are unacceptable for Xpert Xpress SARS-CoV-2/FLU/RSV testing.  Fact Sheet for Patients: BloggerCourse.com  Fact Sheet for Healthcare Providers: SeriousBroker.it  This test is not yet approved or cleared by the [place] FDA and has been authorized for detection and/or diagnosis of SARS-CoV-2 by FDA under an Emergency Use Authorization (EUA). This EUA will remain in effect (meaning this test can be used) for the duration of the COVID-19 declaration under Section 564(b)(1) of the Act, 21 U.S.C. section 360bbb-3(b)(1), unless the authorization is terminated or revoked.  Performed at [facility], [address], [place], [place] [address]   I-Stat beta hCG blood, ED     Status: None   Collection Time: 08/05/21  5:36 AM  Result Value Ref Range   I-stat hCG, quantitative <5.0 <5 mIU/mL   Comment 3            Comment:   GEST. AGE      CONC.  (mIU/mL)   <=  1 WEEK        5 - 50     2 WEEKS       50 - 500     3 WEEKS        100 - 10,000     4 WEEKS     1,000 - 30,000        FEMALE AND NON-PREGNANT FEMALE:     LESS THAN 5 mIU/mL     No current facility-administered medications for this encounter.   Current Outpatient Medications  Medication Sig Dispense Refill   albuterol (PROVENTIL HFA;VENTOLIN HFA) 108 (90 BASE) MCG/ACT inhaler Inhale 2 puffs into the lungs every 6 (six) hours as needed for wheezing or shortness of breath. 1 Inhaler 2   cyclobenzaprine (FLEXERIL) 10 MG tablet Take 1 tablet (10 mg total) by mouth 3 (three) times daily as needed for muscle spasms. 30 tablet 0   ibuprofen (ADVIL) 600 MG tablet Take 1 tablet (600 mg total) by mouth every 6 (six) hours as needed. 30 tablet 0   oxyCODONE-acetaminophen (PERCOCET/ROXICET) 5-325 MG tablet Take 1-2 tablets by mouth every 4 (four) hours as needed for severe pain. 8 tablet 0    Musculoskeletal: Strength & Muscle Tone: increased Gait & Station: normal Patient leans: N/A  Psychiatric Specialty Exam:  Presentation  General Appearance:  Disheveled Eye Contact: Fleeting Speech: Pressured Speech Volume: Increased Handedness: No data recorded  Mood and Affect  Mood: Labile; Angry Affect: Labile  Thought Process  Thought Processes: Disorganized Descriptions of Associations:Tangential Orientation:Partial Thought Content:Paranoid Ideation; Illogical; Scattered; Tangential History of Schizophrenia/Schizoaffective disorder:No  Duration of Psychotic Symptoms:N/A Hallucinations:Hallucinations: Other (comment) (unable to assess due to patient's current state.) Ideas of Reference:Paranoia; Percusatory Suicidal Thoughts:Suicidal Thoughts: No (unable to assess due to patient's current state.) Homicidal Thoughts:Homicidal Thoughts: No (unable to assess due to patient's current state.)  Sensorium  Memory: Immediate Fair Judgment: Impaired Insight: Poor; Lacking  Executive Functions  Concentration: Poor Attention  Span: Poor Recall: Poor Fund of Knowledge: Fair Language: Fair  Psychomotor Activity  Psychomotor Activity: Psychomotor Activity: Increased  Assets  Assets: Housing; Physical Health; Resilience  Sleep  Sleep: Sleep: Poor  Physical Exam: Physical Exam Vitals and nursing note reviewed.  HENT:     Head: Normocephalic.     Nose: Nose normal.  Cardiovascular:     Rate and Rhythm: Normal rate.  Pulmonary:     Effort: Pulmonary effort is normal.  Musculoskeletal:        General: Normal range of motion.     Cervical back: Normal range of motion.  Skin:    General: Skin is warm and dry.  Neurological:     Mental Status: She is disoriented.  Psychiatric:        Attention and Perception: She is inattentive.        Mood and Affect: Affect is labile, angry and inappropriate.        Speech: Speech is rapid and pressured and tangential.        Behavior: Behavior is uncooperative, agitated and combative.        Thought Content: Thought content is paranoid and delusional.        Cognition and Memory: Cognition is impaired. Memory is impaired.        Judgment: Judgment is impulsive and inappropriate.   Review of Systems  Psychiatric/Behavioral:  Positive for hallucinations.   All other systems reviewed and are negative. Blood pressure 103/64, pulse 85, temperature 97.7 F (36.5 C), temperature source Oral, resp.  rate 18, height 5\' 4"  (1.626 m), weight 59 kg, SpO2 97 %, unknown if currently breastfeeding. Body mass index is 22.33 kg/m.  Treatment Plan Summary: Daily contact with patient to assess and evaluate symptoms and progress in treatment, Medication management, and Plan seek inpatient hospitalization for further observation, stabilization, and treatment.   Disposition: Recommend psychiatric Inpatient admission when medically cleared. Supportive therapy provided about ongoing stressors. Discussed crisis plan, support from social network, calling 911, coming to the  Emergency Department, and calling Suicide Hotline.  Loletta Parish, NP 08/05/2021 3:40 PM

## 2021-08-05 NOTE — ED Provider Notes (Signed)
[facility]EMERGENCY [facility]PT Provider Note   CSN: [id number] Arrival date & time: 08/05/21  7681     History Chief Complaint  Patient presents with   IVC    Patricia Dunn is a 35 y.o. female.  Patient presents to the emergency department under IVC for suicide attempt and aggressive behavior.  Patient was sedated by EMS with Versed and Haldol.  Level 5 caveat applies secondary to level of consciousness.  The history is provided by the patient. No language interpreter was used.      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    Class: Acute   Marijuana use 08/29/2020    Class: Acute   Substance induced mood disorder (HCC) 08/29/2020    Class: Acute   Unresponsiveness 01/17/2014   Drug overdose 01/17/2014   Sterilization 07/09/2012   Prenatal care insufficient 06/05/2012    Past Surgical History:  Procedure Laterality Date   NO PAST SURGERIES     TUBAL LIGATION  07/09/2012   Procedure: POST PARTUM TUBAL LIGATION;  Surgeon: Catalina Antigua, MD;  Location: [facility];  Service: Gynecology;  Laterality: Bilateral;   VAGINAL DELIVERY     X 4     OB History     Gravida  7   Para  5   Term  5   Preterm  0   AB  2   Living  5      SAB  2   IAB  0   Ectopic  0   Multiple  0   Live Births  1           Family History  Problem Relation Age of Onset   Other Neg Hx     Social History   Tobacco Use   Smoking status: Every Day    Packs/day: 1.00    Types: Cigarettes   Smokeless tobacco: Never  Substance Use Topics   Alcohol use: Yes    Comment: Beginning of pregnancy   Drug use: Yes    Types: Marijuana    Comment: Last used 4 days ago. Uses twice a week. Denies using @ this time    Home Medications Prior to Admission  medications   Medication Sig Start Date End Date Taking? Authorizing Provider  albuterol (PROVENTIL HFA;VENTOLIN HFA) 108 (90 BASE) MCG/ACT inhaler Inhale 2 puffs into the lungs every 6 (six) hours as needed for wheezing or shortness of breath. 10/12/14   Kirichenko, Lemont Fillers, PA-C  cyclobenzaprine (FLEXERIL) 10 MG tablet Take 1 tablet (10 mg total) by mouth 3 (three) times daily as needed for muscle spasms. 06/18/20   Lamptey, Britta Mccreedy, MD  ibuprofen (ADVIL) 600 MG tablet Take 1 tablet (600 mg total) by mouth every 6 (six) hours as needed. 06/18/20   Lamptey, Britta Mccreedy, MD  oxyCODONE-acetaminophen (PERCOCET/ROXICET) 5-325 MG tablet Take 1-2 tablets by mouth every 4 (four) hours as needed for severe pain. 04/24/16   Elpidio Anis, PA-C    Allergies    Patient has no known allergies.  Review of Systems   Review of Systems  Unable to perform ROS: Mental status change   Physical Exam Updated Vital Signs There were no vitals taken for this visit.  Physical Exam Vitals  and nursing note reviewed.  Constitutional:      General: She is not in acute distress.    Appearance: She is well-developed.  HENT:     Head: Normocephalic and atraumatic.  Eyes:     Conjunctiva/sclera: Conjunctivae normal.  Cardiovascular:     Rate and Rhythm: Normal rate.     Heart sounds: No murmur heard. Pulmonary:     Effort: Pulmonary effort is normal. No respiratory distress.  Abdominal:     General: There is no distension.  Musculoskeletal:     Cervical back: Neck supple.     Comments: Moves all extremities  Skin:    General: Skin is warm and dry.  Neurological:     Mental Status: She is alert and oriented to person, place, and time.  Psychiatric:        Mood and Affect: Mood normal.        Behavior: Behavior normal.    ED Results / Procedures / Treatments   Labs (all labs ordered are listed, but only abnormal results are displayed) Labs Reviewed  COMPREHENSIVE METABOLIC PANEL  SALICYLATE LEVEL   ACETAMINOPHEN LEVEL  ETHANOL  RAPID URINE DRUG SCREEN, HOSP PERFORMED  CBC WITH DIFFERENTIAL/PLATELET  CBG MONITORING, ED  I-STAT BETA HCG BLOOD, ED ([facility], [facility], [facility] ONLY)    EKG None  Radiology No results found.  Procedures Procedures   Medications Ordered in ED Medications - No data to display  ED Course  I have reviewed the triage vital signs and the nursing notes.  Pertinent labs & imaging results that were available during my care of the patient were reviewed by me and considered in my medical decision making (see chart for details).    MDM Rules/Calculators/A&P                           Patient here under IVC for suicide attempt by hitting her head against the concrete.  History is limited secondary to patient having been sedated by EMS due to agitation.  Patient signed out at shift change.  Plan: Reassess after patient wakes up some to confirm history.  TTS evaluation.  Follow-up on labs and imaging. Final Clinical Impression(s) / ED Diagnoses Final diagnoses:  Suicide attempt [facility])    Rx / DC Orders ED Discharge Orders     None        Roxy Horseman, PA-C 08/05/21 0559    Gilda Crease, MD 08/05/21 [phone]

## 2021-08-05 NOTE — ED Notes (Signed)
Right leg restraint removed.

## 2021-08-05 NOTE — ED Notes (Signed)
TTS machine placed at bedside, counselor on line for assessment.  RN attempted multiple times to remove pts blankets from her face and encourage her to participate in assessment.  Pt continuously pulls blankets back over her face, unwilling to participate in assessment.

## 2021-08-05 NOTE — ED Notes (Signed)
TTS counselor notified that pt medically cleared, ready for TTS assessment.

## 2021-08-05 NOTE — ED Notes (Signed)
Patient dressed in wine colored scrubs

## 2021-08-06 LAB — RAPID URINE DRUG SCREEN, HOSP PERFORMED
Amphetamines: NOT DETECTED
Barbiturates: NOT DETECTED
Benzodiazepines: POSITIVE — AB
Cocaine: POSITIVE — AB
Opiates: NOT DETECTED
Tetrahydrocannabinol: POSITIVE — AB

## 2021-08-06 MED ORDER — LORAZEPAM 1 MG PO TABS
1.0000 mg | ORAL_TABLET | ORAL | Status: AC | PRN
  Administered 2021-08-06: 1 mg via ORAL
  Filled 2021-08-06: qty 1

## 2021-08-06 MED ORDER — ZIPRASIDONE MESYLATE 20 MG IM SOLR: 20.0000 mg | INTRAMUSCULAR | Status: DC | PRN

## 2021-08-06 MED ORDER — OLANZAPINE 10 MG PO TBDP
10.0000 mg | ORAL_TABLET | Freq: Every day | ORAL | Status: DC
  Administered 2021-08-06 – 2021-08-08 (×3): 10 mg via ORAL
  Filled 2021-08-06 (×3): qty 1

## 2021-08-06 MED ORDER — OLANZAPINE 5 MG PO TBDP
5.0000 mg | ORAL_TABLET | Freq: Three times a day (TID) | ORAL | Status: DC | PRN
  Administered 2021-08-08: 5 mg via ORAL
  Filled 2021-08-06: qty 1

## 2021-08-06 NOTE — ED Notes (Signed)
Pt requested to call her grandmother. Pt called her grandmother and in her room yelling loudly on the phone.

## 2021-08-06 NOTE — ED Notes (Signed)
The pt refused to take her evening med's, and refused her v/s. She is not redirectable at this time, she has thrown her cup, sandwich and graham crackers on the floor.

## 2021-08-06 NOTE — ED Notes (Addendum)
Again, I encouraged pt to provide a urine specimen and let her know a urine cup is in the restroom. She said, "I when I have to go, I will let you know." Two minutes minutes later, she went to the restroom. We asked for her urine specimen, and she said, "Ya'll didn't tell me."

## 2021-08-06 NOTE — ED Notes (Signed)
Patient calm and cooperative this shift.  No distress noted.  

## 2021-08-06 NOTE — BH Assessment (Signed)
Patient was seen this date in reference to evaluating current mental health status. Patient contnues to be withdrawn and renders limited history. Patient is observed to be in her bed with the covers pulled up over her head. This Clinical research associate spoke with [facility] that was outside the door with sitter reporting that patient has had little interaction with staff. Per last note it does appear that patient was encouraged to provide a urine sample and informed nurse she would let her know when she was going to the restroom although patient soon went there after reporting that no one had informed her that they needed a urine sample. Patient continues to meet inpatient criteria as bed placement is investigated.

## 2021-08-06 NOTE — ED Notes (Signed)
Pt sat up in bed and requested to use the phone. Told pt she could use the phone after providing urine specimen. She said, "Oh we ain't going to do that. I don't want it." And, she laid back in bed.

## 2021-08-06 NOTE — ED Notes (Signed)
Pt requested I call her mother, Daisa Stennis [phone], and explain the reason she is hospitalized and under IVC. Called pt mother per pt request.

## 2021-08-06 NOTE — Progress Notes (Signed)
Brayton Caves from [facility] contacted CSW in reference to a referral sent  yesterday for placement. It was reported that there no current appropriate beds available to meet the needs of patient at this facility.  Crissie Reese, MSW, LCSW-A, LCAS-A Phone: [phone] Disposition/TOC

## 2021-08-06 NOTE — ED Notes (Signed)
Pt awake. Pt asked me why she is here with "crazy people." Explained she is involuntarily committed. Pt said, "I didn't voluntary come here. My family didn't voluntary me here. Somebody better give me more information before I show out because I'm going to show out." Explained why pt is under IVC commitment and that our mental health team has attempted to speak with her for 2 days, but she did not speak with them. She said she is awake now and needs to see them now. Explained they are now unavailable and will see her tomorrow morning. Pt said she "flipped out" when her grandmother got sick and she is fine now. Said she remembers banding her head against a police car.

## 2021-08-07 MED ORDER — ZOLPIDEM TARTRATE 5 MG PO TABS
5.0000 mg | ORAL_TABLET | Freq: Every evening | ORAL | Status: DC | PRN
  Administered 2021-08-07: 5 mg via ORAL
  Filled 2021-08-07 (×2): qty 1

## 2021-08-07 NOTE — ED Provider Notes (Signed)
Emergency Medicine Observation Re-evaluation Note  Patricia Dunn is a 35 y.o. female, seen on rounds today.  Pt initially presented to the ED for complaints of IVC, suicidal thoughts, and substance abuse. BH team has been recommending inpatient psych tx, but no bed yet found.   Physical Exam  BP 115/85 (BP Location: Left Arm)   Pulse 74   Temp 98 F (36.7 C) (Oral)   Resp 18   Ht 1.626 m (5\' 4" )   Wt 59 kg   SpO2 97%   BMI 22.33 kg/m  Physical Exam General: alert, nad Cardiac: regular rate Lungs: breathing comfortably Psych: calm, alert. Not currently responding to internal stimuli.   ED Course / MDM    I have reviewed the labs performed to date as well as medications administered while in observation.  Recent changes in the last 24 hours include ED obs and BH reassessment.   Plan  BH team has reassessed and is recommending inpatient BH  tx - BH SW is attempting to locate inpatient BH bed.       , MD 08/07/21 1155

## 2021-08-07 NOTE — ED Notes (Signed)
Patient refused shower tech ask pt 2 times she said no RN notify

## 2021-08-07 NOTE — Progress Notes (Signed)
Per Patricia Dunn, patient meets criteria for inpatient treatment. There are no available or appropriate beds at [facility] today. CSW faxed referrals to the following facilities for review:  [facility]  Pending - Request Sent N/A [address] [place] [address] [phone] --  [facility]  Pending - Request Sent N/A [address] [place] [address] [phone] --  [facility]  Pending - Request Sent N/A [address] [place], [place] [address] [phone] --  [facility]  Pending - Request Sent N/A [phone] [place] [address] [phone] --  [facility]  Pending - Request Sent N/A [address] [place] [address] [phone] --  [facility]  Pending - Request Sent N/A [address], [place] [address] [phone] --  [facility]  Pending - Request Sent N/A [address] [place] [address] [phone] --  [facility]  Pending - Request Sent N/A [address] [place] [address] [phone] --  [facility]  Pending - Request Sent N/A [address] [place] [address] [phone] --  [facility]  Pending - Request Sent N/A [address] [place] [address] [phone] --  [facility]  Pending - Request Sent N/A [address], [place] [address] [phone] --  [facility]  Pending - Request Sent N/A [address] [place] [address] [phone] --  [facility]  Pending - Request Sent N/A [address] [place] [address] [phone] --  [facility]  Pending - Request  Sent N/A [address] [place] [address] [phone] --  [facility]  Pending - Request Sent N/A [address] [place] [address] [phone] --  [facility]  Pending - No Request Sent N/A [address], [place] [address] [phone] --  [facility]  Pending - No Request Sent N/A [address], [place] [address] [phone] --  [facility]  Pending - No Request Sent N/A [address] [place] [address] [phone] --  [facility]  Pending - No Request Sent N/A [address] [place] [address] [phone] --  [facility]  Pending - No Request Sent N/A [address] [place] [address] [phone] --  [facility]  Pending - No Request Sent N/A [address] [place] [address] [phone] --  [facility]  Pending - No Request Sent N/A [address] [place], [place] [address] [phone] --  [facility]  Pending - No Request Sent N/A [address] [place] [address] [phone] --  [facility]  Pending - No Request Sent N/A [address], [place] [address] [phone] --  [facility]  Pending - No Request Sent N/A [address], [place] [address] [phone] --  [facility]  Pending - No Request Sent N/A [address] [place] [address] [phone] --    TTS will continue to seek bed placement.  Crissie Reese, MSW, LCSW-A, LCAS-A Phone: [phone] Disposition/TOC

## 2021-08-07 NOTE — ED Notes (Signed)
The pt has decided to take her pm med's and have her blood pressure checked at this time.

## 2021-08-07 NOTE — ED Notes (Signed)
Patient at asleep for breakfast at this time

## 2021-08-07 NOTE — ED Notes (Signed)
Pt  alert and oriented this shift. Pt calm and cooperative with care. Pt medication compliant.  Pt does not seem to be responding to internal stimuli and not hurting self.  Pt has been asking to talk to provider. Pt reassured.

## 2021-08-07 NOTE — Progress Notes (Signed)
Lanora Manis from [facility] contacted CSW and advised that they currently have no available beds at this time in reference to this patient.   Crissie Reese, MSW, LCSW-A, LCAS-A Phone: [phone] Disposition/TOC

## 2021-08-08 DIAGNOSIS — F1994 Other psychoactive substance use, unspecified with psychoactive substance-induced mood disorder: Secondary | ICD-10-CM

## 2021-08-08 MED ORDER — QUETIAPINE FUMARATE 50 MG PO TABS: 50.0000 mg | ORAL_TABLET | Freq: Every day | ORAL | 0 refills | Status: DC

## 2021-08-08 NOTE — ED Provider Notes (Signed)
Emergency Medicine Observation Re-evaluation Note  Patricia Dunn is a 35 y.o. female, seen on rounds today.  Pt initially presented to the ED for complaints of IVC and Psychiatric Evaluation Currently, the patient is awaiting psych placement.  Physical Exam  BP 94/64 (BP Location: Left Arm)   Pulse 60   Temp 97.9 F (36.6 C) (Oral)   Resp 16   Ht 5\' 4"  (1.626 m)   Wt 59 kg   SpO2 100%   BMI 22.33 kg/m  Physical Exam General: awake Cardiac: regualtr Lungs: clear Psych: calm  ED Course / MDM  EKG:EKG Interpretation  Date/Time:  Friday August 05 2021 05:18:56 EDT Ventricular Rate:  90 PR Interval:  134 QRS Duration: 78 QT Interval:  377 QTC Calculation: 462 R Axis:   85 Text Interpretation: Sinus rhythm Baseline wander in lead(s) V2 Confirmed by 08-20-2004 [phone]) on 08/05/2021 5:42:33 AM  I have reviewed the labs performed to date as well as medications administered while in observation.  Recent changes in the last 24 hours include nothing.  Plan  Current plan is for awiting psych placement.  Patricia Dunn is not under involuntary commitment.     Marsa Aris, DO 08/08/21 [phone]

## 2021-08-08 NOTE — Consult Note (Signed)
[facility] Psych ED Discharge  08/08/2021 10:49 AM Patricia Dunn  MRN:  [id number]  Method of visit?: Face to Face   Principal Problem: Substance induced mood disorder [facility]) Discharge Diagnoses: Active Problems:   Substance induced mood disorder (HCC)   Subjective: Patricia Dunn  presented as a pleasant, down to earth female  who cooperatively participated in the interview. Patricia Dunn was alert and well oriented, with no overt signs of cognitive difficulty or sedation potentially associated with medication use. Her speech was of normal rate, tone, volume and fluency. Her mood and affect were euthymic, modulating appropriately in accord with what she discussed. Ms. Fill responses to questions were relevant and to the point, with thought processes linear and goal directed, with no signs of thought disorder, flight of ideas, suicidal or paranoid ideation.   HPI: Patricia Dunn is a 35 y.o. female patient admitted with psychotic and suicidal behaviors after patient bought in by EMS after attempting to slit her wrist and hitting her head against the concrete. On assessment patient observed yelling at officers nearby. Refusing to participate in TTS assessment or any patient care by hospital staff. Mood labile. Speech pressured and tangential. She mentions her grandmother being in the hospital [facility] room 8) "about to die"; she is disorganized and appears to be responding to internal stimuli. Provider attempted to obtain family (aunt Naomi Castrogiovanni) information; patient states she didn't know it by heart and after searching personal items cellphone was not found. UDS remains outstanding due to patient refusal. BAL 150. Recommendation for inpatient hospitalization noted."  Total Time spent with patient: 30 minutes  Past Psychiatric History: Substance Induced Mood Disorder  Past Medical History:  Past Medical History:  Diagnosis Date   Asthma    Headache(784.0)    migraines    Smoker     Past Surgical History:  Procedure Laterality Date   NO PAST SURGERIES     TUBAL LIGATION  07/09/2012   Procedure: POST PARTUM TUBAL LIGATION;  Surgeon: Catalina Antigua, MD;  Location: [facility];  Service: Gynecology;  Laterality: Bilateral;   VAGINAL DELIVERY     X 4   Family History:  Family History  Problem Relation Age of Onset   Other Neg Hx    Family Psychiatric  History: denies Social History:  Social History   Substance and Sexual Activity  Alcohol Use Yes   Comment: Beginning of pregnancy     Social History   Substance and Sexual Activity  Drug Use Yes   Types: Marijuana   Comment: Last used 4 days ago. Uses twice a week. Denies using @ this time    Social History   Socioeconomic History   Marital status: Single    Spouse name: Not on file   Number of children: Not on file   Years of education: Not on file   Highest education level: Not on file  Occupational History   Not on file  Tobacco Use   Smoking status: Every Day    Packs/day: 1.00    Types: Cigarettes   Smokeless tobacco: Never  Substance and Sexual Activity   Alcohol use: Yes    Comment: Beginning of pregnancy   Drug use: Yes    Types: Marijuana    Comment: Last used 4 days ago. Uses twice a week. Denies using @ this time   Sexual activity: Yes    Birth control/protection: None    Comment: Last intercourse this AM  Other Topics Concern  Not on file  Social History Narrative   Not on file   Social Determinants of Health   Financial Resource Strain: Not on file  Food Insecurity: Not on file  Transportation Needs: Not on file  Physical Activity: Not on file  Stress: Not on file  Social Connections: Not on file    Tobacco Cessation:  N/A, patient does not currently use tobacco products  Current Medications: Current Facility-Administered Medications  Medication Dose Route Frequency Provider Last Rate Last Admin   OLANZapine zydis (ZYPREXA) disintegrating tablet 10 mg  10 mg  Oral Daily Leevy-Johnson, Brooke A, NP   10 mg at 08/08/21 1030   OLANZapine zydis (ZYPREXA) disintegrating tablet 5 mg  5 mg Oral Q8H PRN Leevy-Johnson, Brooke A, NP   5 mg at 08/08/21 0247   And   ziprasidone (GEODON) injection 20 mg  20 mg Intramuscular PRN Leevy-Johnson, Brooke A, NP       zolpidem (AMBIEN) tablet 5 mg  5 mg Oral QHS PRN Charlynne Pander, MD   5 mg at 08/07/21 2151   Current Outpatient Medications  Medication Sig Dispense Refill   albuterol (PROVENTIL HFA;VENTOLIN HFA) 108 (90 BASE) MCG/ACT inhaler Inhale 2 puffs into the lungs every 6 (six) hours as needed for wheezing or shortness of breath. (Patient not taking: Reported on 08/06/2021) 1 Inhaler 2   cyclobenzaprine (FLEXERIL) 10 MG tablet Take 1 tablet (10 mg total) by mouth 3 (three) times daily as needed for muscle spasms. (Patient not taking: Reported on 08/06/2021) 30 tablet 0   ibuprofen (ADVIL) 600 MG tablet Take 1 tablet (600 mg total) by mouth every 6 (six) hours as needed. (Patient not taking: Reported on 08/06/2021) 30 tablet 0   oxyCODONE-acetaminophen (PERCOCET/ROXICET) 5-325 MG tablet Take 1-2 tablets by mouth every 4 (four) hours as needed for severe pain. (Patient not taking: Reported on 08/06/2021) 8 tablet 0   PTA Medications: (Not in a hospital admission)   Musculoskeletal: Strength & Muscle Tone: within normal limits Gait & Station: normal Patient leans: N/A  Psychiatric Specialty Exam:  Presentation  General Appearance: Appropriate for Environment; Casual  Eye Contact:Fair  Speech:Normal Rate  Speech Volume:Normal  Handedness:No data recorded  Mood and Affect  Mood:Euthymic  Affect:Appropriate; Congruent   Thought Process  Thought Processes:Coherent; Linear  Descriptions of Associations:Intact  Orientation:Full (Time, Place and Person)  Thought Content:Logical  History of Schizophrenia/Schizoaffective disorder:No  Duration of Psychotic Symptoms:Less than six  months  Hallucinations:Hallucinations: None  Ideas of Reference:None  Suicidal Thoughts:Suicidal Thoughts: No  Homicidal Thoughts:Homicidal Thoughts: No   Sensorium  Memory:Immediate Fair; Remote Fair; Recent Fair  Judgment:Fair  Insight:Fair   Executive Functions  Concentration:Fair  Attention Span:Fair  Recall:Fair  Fund of Knowledge:Fair  Language:Fair   Psychomotor Activity  Psychomotor Activity:Psychomotor Activity: Normal   Assets  Assets:Communication Skills; Desire for Improvement; Financial Resources/Insurance; Resilience; Physical Health; Leisure Time; Transportation   Sleep  Sleep:Sleep: Fair    Physical Exam: Physical Exam Vitals and nursing note reviewed.  Constitutional:      Appearance: Normal appearance. She is normal weight.  HENT:     Head: Normocephalic.  Musculoskeletal:        General: Normal range of motion.  Skin:    General: Skin is warm and dry.  Neurological:     General: No focal deficit present.     Mental Status: She is alert and oriented to person, place, and time. Mental status is at baseline.  Psychiatric:  Mood and Affect: Mood normal.        Behavior: Behavior normal.        Thought Content: Thought content normal.        Judgment: Judgment normal.   Review of Systems  Psychiatric/Behavioral: Negative.    All other systems reviewed and are negative. Blood pressure 94/64, pulse 60, temperature 97.9 F (36.6 C), temperature source Oral, resp. rate 16, height 5\' 4"  (1.626 m), weight 59 kg, SpO2 100 %, unknown if currently breastfeeding. Body mass index is 22.33 kg/m.   Demographic Factors:  Low socioeconomic status, Living alone, and Unemployed  Loss Factors: Legal issues and Financial problems/change in socioeconomic status  Historical Factors: Impulsivity  Risk Reduction Factors:   Sense of responsibility to family, Living with another person, especially a relative, Positive social support,  Positive therapeutic relationship, and Positive coping skills or problem solving skills  Continued Clinical Symptoms:  Severe Anxiety and/or Agitation Bipolar Disorder:   Bipolar II Alcohol/Substance Abuse/Dependencies Unstable or Poor Therapeutic Relationship Previous Psychiatric Diagnoses and Treatments  Cognitive Features That Contribute To Risk:  None    Suicide Risk:  Minimal: No identifiable suicidal ideation.  Patients presenting with no risk factors but with morbid ruminations; may be classified as minimal risk based on the severity of the depressive symptoms    Plan Of Care/Follow-up recommendations:  Other:  Will  place prescription refills for Seroquel 50mg  po qhs. Patient is to follow up with outpatient psychiatric clinic at [facility]. Patient to benefit from therapy and medication management at this time.   Disposition: Psych cleared. Prescription for seroquel will be sent to [facility] on [address].  [facility], FNP 08/08/2021, 10:49 AM

## 2021-08-08 NOTE — BH Assessment (Signed)
[facility] Assessment Progress Note   Per Caryn Bee, NP, this pt no longer requires psychiatric hospitalization.  Pt presents under IVC initiated by law enforcement and upheld by EDP Jaci Carrel, MD, which has been rescinded by Nelly Rout, MD.  Pt is psychiatrically cleared.  Discharge instructions include referral information for [facility].  EDP Virgina Norfolk, DO and pt's nurse, Dekina, have been notified.  Doylene Canning, MA Triage Specialist [phone]

## 2021-08-08 NOTE — ED Provider Notes (Signed)
Cleared by psych and discharge.   Virgina Norfolk, DO 08/08/21 1135

## 2021-08-08 NOTE — ED Notes (Addendum)
Patient reports increased agitation since she has not been to sleep.  She is paranoid that staff did not give her sleep medicine stating, "What's the name of the pill you gave me?  I thought you said it was supposed to help me sleep.  I thought y'all were trying to be funny because y'all don't want me to sleep."  Support and encouragement offered to patient.  Patient receptive.

## 2021-08-08 NOTE — Discharge Instructions (Signed)
For your behavioral health needs you are advised to follow up with [facility] at your earliest opportunity: °     [facility] °     [address] °     [place], [place] [address] °     [phone] °     They offer psychiatry/medication management, therapy and substance use disorder treatment.  New patients are seen in their walk-in clinic.  Walk-in hours are Monday - Thursday from 8:00 am - 11:00 am for psychiatry, and Friday from 1:00 pm - 4:00 pm for therapy.  Walk-in patients are seen on a first come, first served basis, so try to arrive as early as possible for the best chance of being seen the same day.  Please note that to be eligible for services you must bring an ID or a piece of mail with your name and a [place] address. ° °

## 2021-08-08 NOTE — Progress Notes (Signed)
CSW spoke with pt's, pt requesting housing resources. Pt stated she will call a friend for transportation upon d/c . CSW provided pt with Housing authority resources and shelter list.     Valentina Shaggy.Soleil Mas, MSW, LCSWA [facility]  Transitions of Care Clinical Social Worker I Direct Dial: [phone]  Fax: [phone] [email or web address]

## 2022-03-16 IMAGING — CT CT CERVICAL SPINE W/O CM
3 of 4 series · 12 of 33 positions shown, 14 images · non-contrast
Comparison: Head CT today.

CLINICAL DATA: 35-year-old female with violent behavior. Banging
head on concrete.

EXAM:
CT CERVICAL SPINE WITHOUT CONTRAST
TECHNIQUE: Multidetector CT imaging of the cervical spine was performed without
intravenous contrast. Multiplanar CT image reconstructions were also
generated.

[Series 5: orthogonal bone · axial · 0.19mm/px · z∈[+1142,+1259]mm · 4 of 96 slices shown, 5 images]
[im 16/96  soft-tissue]
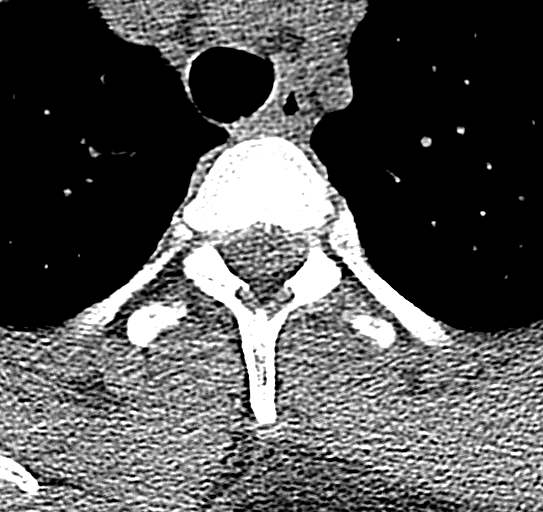
[im 16/96  bone]
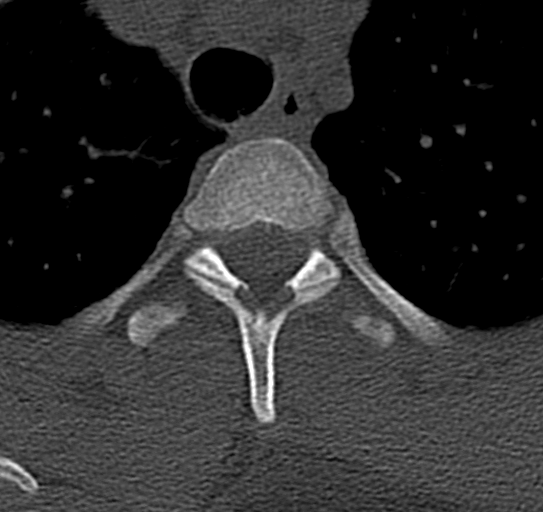
[im 32/96  bone]
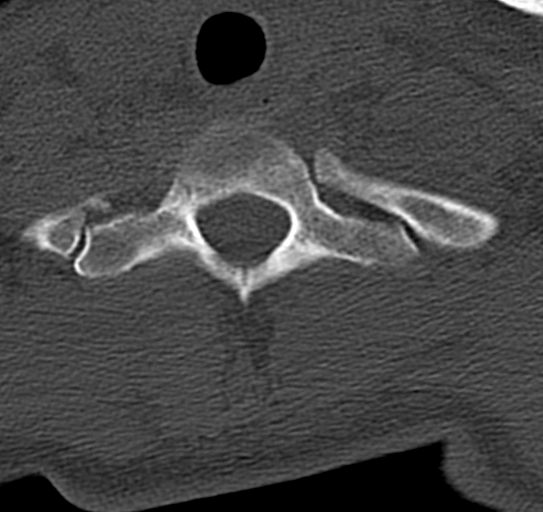
[im 64/96  bone]
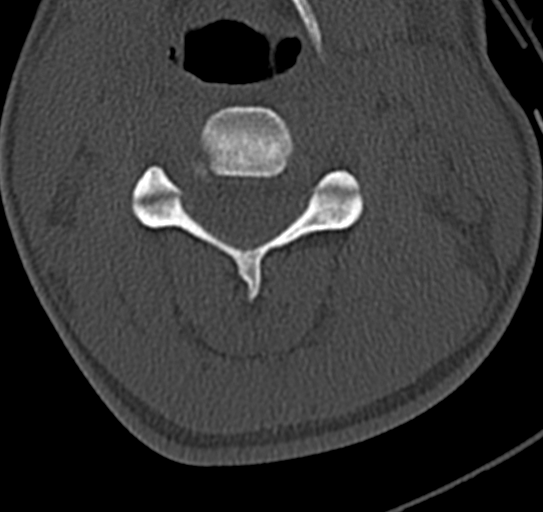
[im 80/96  bone]
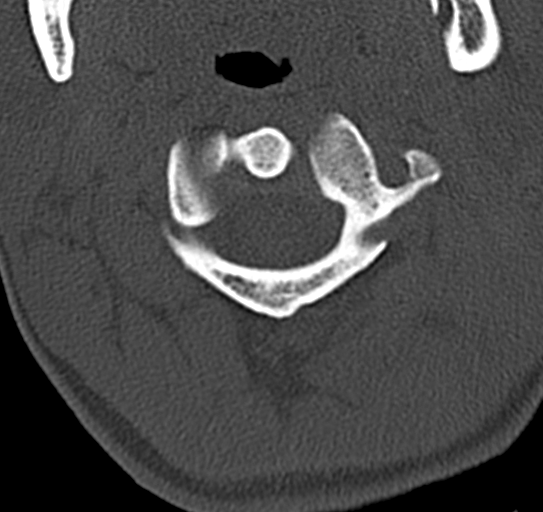

[Series 6: coronal bone · coronal · 0.19mm/px · 3 of 47 slices shown]
[im 10/47  bone]
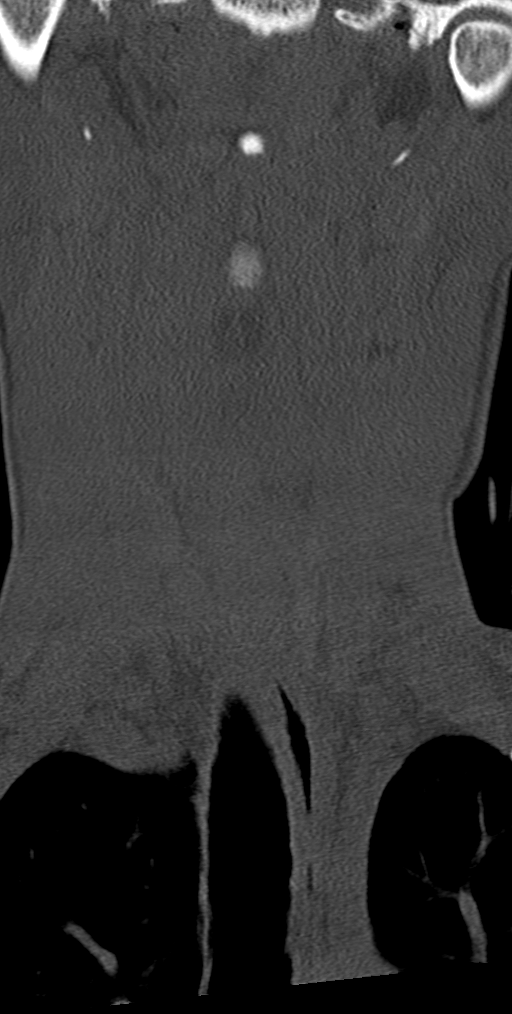
[im 19/47  bone]
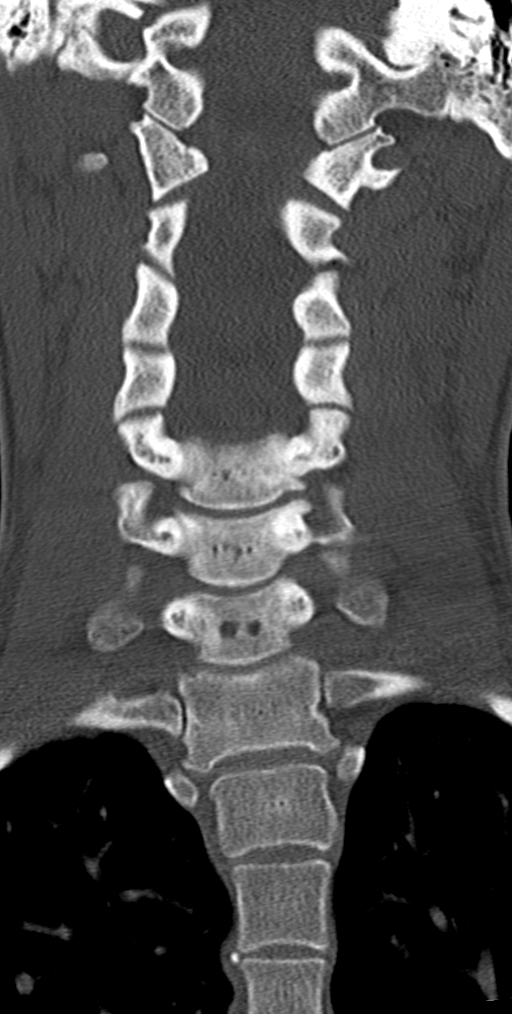
[im 28/47  bone]
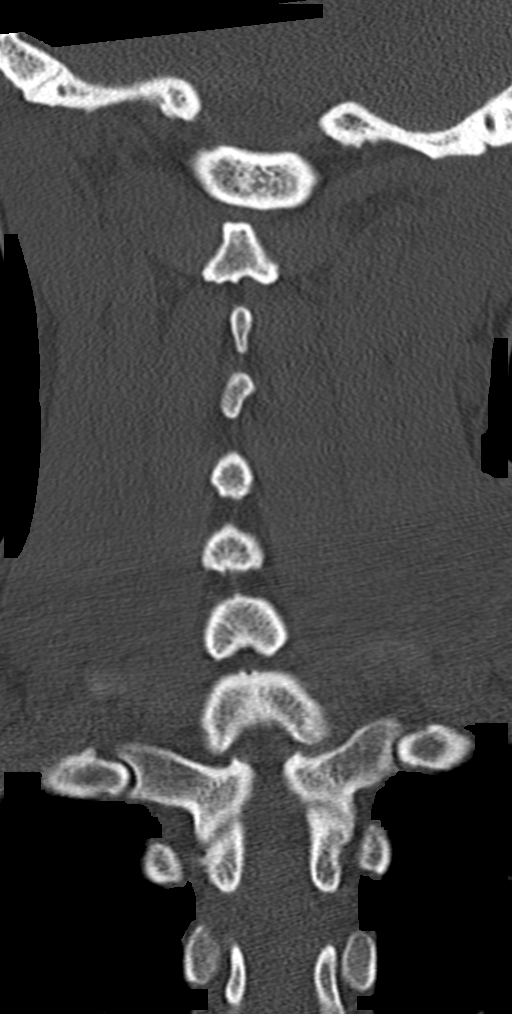

[Series 7: sagittal bone · sagittal · 0.27mm/px · 5 of 43 slices shown, 6 images]
[im 15/43  bone]
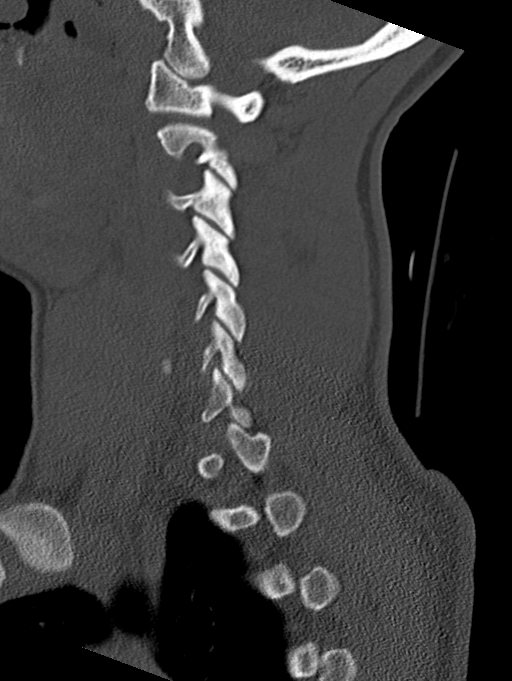
[im 18/43  bone]
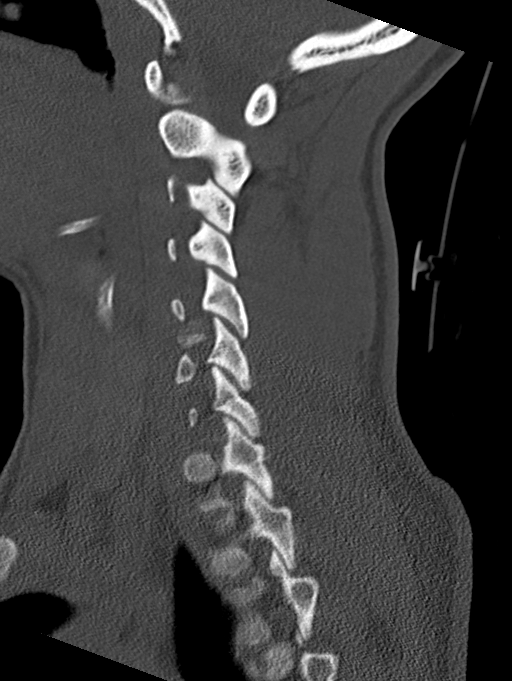
[im 22/43  soft-tissue]
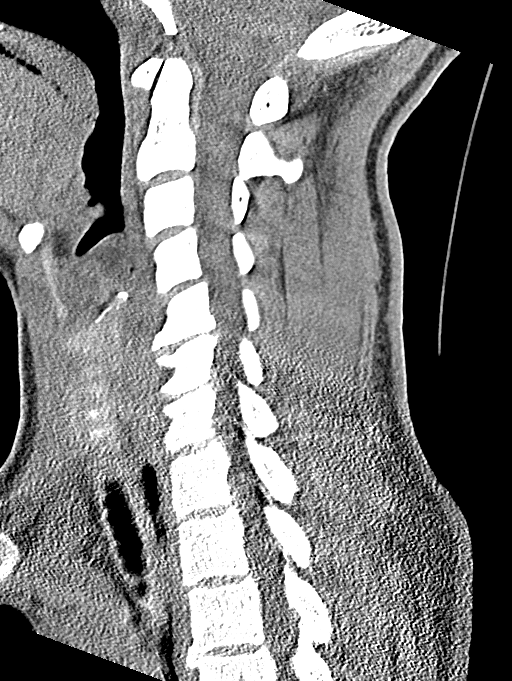
[im 22/43  bone]
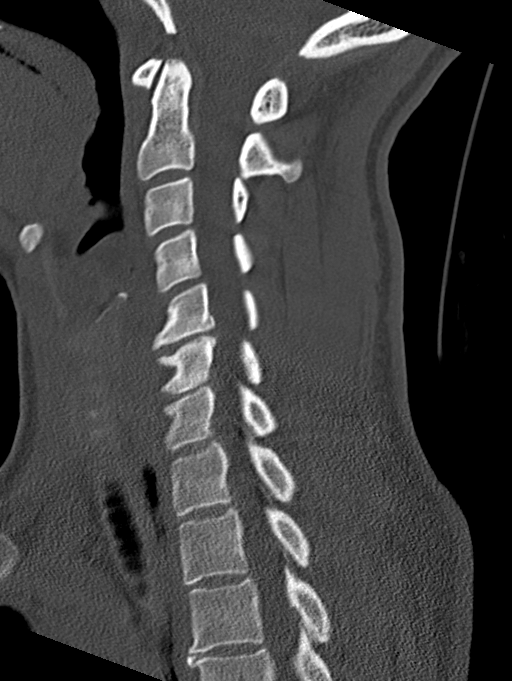
[im 25/43  bone]
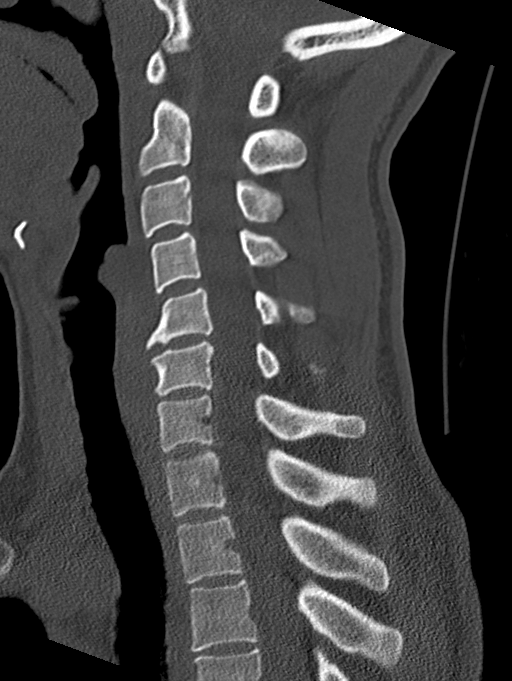
[im 29/43  bone]
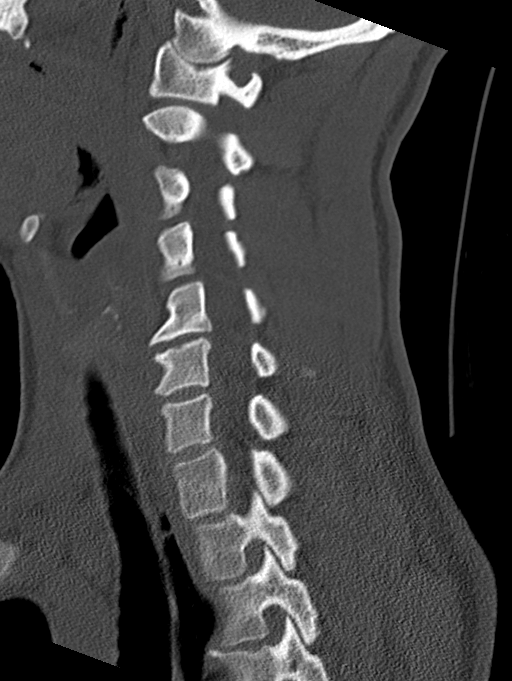

[12 of 33 positions shown; findings below may reference images not displayed]

FINDINGS: Alignment: Straightening of cervical lordosis. Subtle dextroconvex
cervical spine curvature. Cervicothoracic junction alignment is
within normal limits. Bilateral posterior element alignment is
within normal limits.

Skull base and vertebrae: Visualized skull base is intact. No
atlanto-occipital dissociation. C1 and C2 appear intact and aligned.
No acute osseous abnormality identified.

Soft tissues and spinal canal: No prevertebral fluid or swelling. No
visible canal hematoma. Negative visible noncontrast neck soft
tissues.

Disc levels: Chronic disc and endplate degeneration at C5-C6. No
definite spinal stenosis.

Upper chest: Visible upper thoracic levels appear intact. Small
degenerative gas containing geode at the right 1st costovertebral
junction. Negative lung apices, but some dependent retained
secretions throughout the trachea (series 3, image 75).
IMPRESSION: 1. No acute traumatic injury identified in the cervical spine.
Chronic C5-C6 disc and endplate degeneration.
2. Layering secretions within the trachea, suspicious for
aspiration.

## 2022-11-19 ENCOUNTER — Ambulatory Visit (HOSPITAL_COMMUNITY)
Admission: EM | Admit: 2022-11-19 | Discharge: 2022-11-19 | Discharge status: Absent without leave | Payer: Medicaid Other

## 2022-11-22 ENCOUNTER — Ambulatory Visit (HOSPITAL_COMMUNITY)
Admission: EM | Admit: 2022-11-22 | Discharge: 2022-11-22 | Disposition: A | Discharge status: Home / Self Care | Payer: BLUE CROSS/BLUE SHIELD | Attending: Emergency Medicine | Admitting: Emergency Medicine

## 2022-11-22 DIAGNOSIS — Z1152 Encounter for screening for COVID-19: Secondary | ICD-10-CM | POA: Diagnosis not present

## 2022-11-22 DIAGNOSIS — J069 Acute upper respiratory infection, unspecified: Secondary | ICD-10-CM

## 2022-11-22 DIAGNOSIS — Z113 Encounter for screening for infections with a predominantly sexual mode of transmission: Secondary | ICD-10-CM | POA: Insufficient documentation

## 2022-11-22 LAB — POC INFLUENZA A AND B ANTIGEN (URGENT CARE ONLY)
INFLUENZA A ANTIGEN, POC: NEGATIVE
INFLUENZA B ANTIGEN, POC: NEGATIVE

## 2022-11-22 NOTE — ED Triage Notes (Signed)
Pt is here for cough, headache, ear pain body aches , chills , low in energy .diarrhea, vomiting , loss of appetite since today

## 2022-11-22 NOTE — ED Provider Notes (Addendum)
[place]    CSN: [id number] Arrival date & time: 11/22/22  1521      History   Chief Complaint Chief Complaint  Patient presents with   Cough   Headache    HPI Patricia Dunn is a 37 y.o. female.   Patient presents for evaluation of chills, body aches, nasal congestion, rhinorrhea, sore throat, cough, intermittent generalized headaches and vomiting and diarrhea beginning today.  Unable to tolerate food and liquids.  No known sick contacts.  Denies shortness of breath or wheezing.  History of asthma.  Tobacco use.    Requesting routine STD testing.  Denies all symptoms.  No known exposure.  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    Class: Acute   Marijuana use 08/29/2020    Class: Acute   Substance induced mood disorder ([place]) 08/29/2020    Class: Acute   Unresponsiveness 01/17/2014   Drug overdose 01/17/2014   Sterilization 07/09/2012   Prenatal care insufficient 06/05/2012    Past Surgical History:  Procedure Laterality Date   NO PAST SURGERIES     TUBAL LIGATION  07/09/2012   Procedure: POST PARTUM TUBAL LIGATION;  Surgeon: Mora Bellman, MD;  Location: [facility];  Service: Gynecology;  Laterality: Bilateral;   VAGINAL DELIVERY     X 4    OB History     Gravida  7   Para  5   Term  5   Preterm  0   AB  2   Living  5      SAB  2   IAB  0   Ectopic  0   Multiple  0   Live Births  1            Home Medications    Prior to Admission medications   Medication Sig Start Date End Date Taking? Authorizing Provider  albuterol (PROVENTIL HFA;VENTOLIN HFA) 108 (90 BASE) MCG/ACT inhaler Inhale 2 puffs into the lungs every 6 (six) hours as needed for wheezing or shortness of breath. Patient not taking: Reported on 08/06/2021  10/12/14   Jeannett Senior, PA-C  cyclobenzaprine (FLEXERIL) 10 MG tablet Take 1 tablet (10 mg total) by mouth 3 (three) times daily as needed for muscle spasms. Patient not taking: Reported on 08/06/2021 06/18/20   Chase Picket, MD  ibuprofen (ADVIL) 600 MG tablet Take 1 tablet (600 mg total) by mouth every 6 (six) hours as needed. Patient not taking: Reported on 08/06/2021 06/18/20   Chase Picket, MD  oxyCODONE-acetaminophen (PERCOCET/ROXICET) 5-325 MG tablet Take 1-2 tablets by mouth every 4 (four) hours as needed for severe pain. Patient not taking: Reported on 08/06/2021 04/24/16   Charlann Lange, PA-C  QUEtiapine (SEROQUEL) 50 MG tablet Take 1 tablet (50 mg total) by mouth at bedtime. 08/08/21   Suella Broad, FNP    Family History Family History  Problem Relation Age of Onset   Other Neg Hx     Social History Social History   Tobacco Use   Smoking status: Every Day    Packs/day: 1.00    Types: Cigarettes   Smokeless tobacco: Never  Substance Use Topics   Alcohol use: Yes    Comment: Beginning of pregnancy  Drug use: Yes    Types: Marijuana    Comment: Last used 4 days ago. Uses twice a week. Denies using @ this time     Allergies   Patient has no known allergies.   Review of Systems Review of Systems  Constitutional:  Positive for chills. Negative for activity change, appetite change, diaphoresis, fatigue, fever and unexpected weight change.  HENT:  Positive for congestion, rhinorrhea and sore throat. Negative for dental problem, drooling, ear discharge, ear pain, facial swelling, hearing loss, mouth sores, nosebleeds, postnasal drip, sinus pressure, sinus pain, sneezing, tinnitus, trouble swallowing and voice change.   Respiratory:  Positive for cough. Negative for apnea, choking, chest tightness, shortness of breath, wheezing and stridor.   Cardiovascular: Negative.   Gastrointestinal:  Positive for diarrhea and vomiting. Negative for abdominal  distention, abdominal pain, anal bleeding, blood in stool, constipation, nausea and rectal pain.  Musculoskeletal:  Positive for myalgias. Negative for arthralgias, back pain, gait problem, joint swelling, neck pain and neck stiffness.  Neurological:  Positive for headaches. Negative for dizziness, tremors, seizures, syncope, facial asymmetry, speech difficulty, weakness, light-headedness and numbness.     Physical Exam Triage Vital Signs ED Triage Vitals  Enc Vitals Group     BP --      Pulse Rate 11/22/22 1602 60     Resp 11/22/22 1602 14     Temp 11/22/22 1602 98 F (36.7 C)     Temp Source 11/22/22 1602 Oral     SpO2 11/22/22 1602 96 %     Weight --      Height --      Head Circumference --      Peak Flow --      Pain Score 11/22/22 1601 0     Pain Loc --      Pain Edu? --      Excl. in [place]? --    No data found.  Updated Vital Signs Pulse 60   Temp 98 F (36.7 C) (Oral)   Resp 14   LMP  (LMP Unknown)   SpO2 96%   Visual Acuity Right Eye Distance:   Left Eye Distance:   Bilateral Distance:    Right Eye Near:   Left Eye Near:    Bilateral Near:     Physical Exam Constitutional:      Appearance: She is ill-appearing.  HENT:     Head: Normocephalic.     Right Ear: Tympanic membrane, ear canal and external ear normal.     Left Ear: Tympanic membrane, ear canal and external ear normal.     Nose: Congestion and rhinorrhea present.     Mouth/Throat:     Mouth: Mucous membranes are moist.     Pharynx: No posterior oropharyngeal erythema.  Cardiovascular:     Rate and Rhythm: Normal rate and regular rhythm.     Pulses: Normal pulses.     Heart sounds: Normal heart sounds.  Pulmonary:     Effort: Pulmonary effort is normal.     Breath sounds: Normal breath sounds.  Neurological:     Mental Status: She is alert and oriented to person, place, and time. Mental status is at baseline.  Psychiatric:        Mood and Affect: Mood normal.        Behavior: Behavior  normal.      UC Treatments / Results  Labs (all labs ordered are listed, but only abnormal results are displayed) Labs Reviewed  POC INFLUENZA  A AND B ANTIGEN (URGENT CARE ONLY)    EKG   Radiology No results found.  Procedures Procedures (including critical care time)  Medications Ordered in UC Medications - No data to display  Initial Impression / Assessment and Plan / UC Course  I have reviewed the triage vital signs and the nursing notes.  Pertinent labs & imaging results that were available during my care of the patient were reviewed by me and considered in my medical decision making (see chart for details).  Viral URI with cough routine screening for STI  Patient is in no signs of distress nor toxic appearing.  Vital signs are stable.  Low suspicion for pneumonia, pneumothorax or bronchitis and therefore will defer imaging.  Flu Test negative.  COVID test is pending, reviewed quarantine guidelines per CDC recommendations, as young healthy adult does not qualify for antivirals if positive May use additional over-the-counter medications as needed for supportive care.  May follow-up with urgent care as needed if symptoms persist or worsen. Labs are pending, advised abstinence until lab results, and/or treatment is complete , may follow-up with urgent care as needed  Note given.   Final Clinical Impressions(s) / UC Diagnoses   Final diagnoses:  None   Discharge Instructions   None    ED Prescriptions   None    PDMP not reviewed this encounter.   Hans Eden, NP 11/22/22 1717    Hans Eden, NP 11/22/22 [phone]

## 2022-11-22 NOTE — Discharge Instructions (Addendum)
Your symptoms today are most likely being caused by a virus and should steadily improve in time it can take up to 7 to 10 days before you truly start to see a turnaround however things will get better  Flu test negative   COVID test is pending up to 24 hours you will be notified of positive test results only, if positive you will need to quarantine for 5 days and may return activity on Monday    You can take Tylenol and/or Ibuprofen as needed for fever reduction and pain relief.   For cough: honey 1/2 to 1 teaspoon (you can dilute the honey in water or another fluid).  You can also use guaifenesin and dextromethorphan for cough. You can use a humidifier for chest congestion and cough.  If you don't have a humidifier, you can sit in the bathroom with the hot shower running.      For sore throat: try warm salt water gargles, cepacol lozenges, throat spray, warm tea or water with lemon/honey, popsicles or ice, or OTC cold relief medicine for throat discomfort.   For congestion: take a daily anti-histamine like Zyrtec, Claritin, and a oral decongestant, such as pseudoephedrine.  You can also use Flonase 1-2 sprays in each nostril daily.   It is important to stay hydrated: drink plenty of fluids (water, gatorade/powerade/pedialyte, juices, or teas) to keep your throat moisturized and help further relieve irritation/discomfort.  Labs pending 2-3 days, you will be contacted if positive for any sti and treatment will be sent to the pharmacy, you will have to return to the clinic if positive for gonorrhea to receive treatment   Please refrain from having sex until labs results, if positive please refrain from having sex until treatment complete and symptoms resolve   If positive for  Chlamydia  gonorrhea or trichomoniasis please notify partner or partners so they may tested as well  Moving forward, it is recommended you use some form of protection against the transmission of sti infections  such as  condoms or dental dams with each sexual encounter

## 2022-11-23 ENCOUNTER — Telehealth (HOSPITAL_COMMUNITY): Payer: Self-pay | Admitting: Emergency Medicine

## 2022-11-23 LAB — CERVICOVAGINAL ANCILLARY ONLY
Bacterial Vaginitis (gardnerella): POSITIVE — AB
Candida Glabrata: NEGATIVE
Candida Vaginitis: NEGATIVE
Chlamydia: NEGATIVE
Comment: NEGATIVE
Comment: NEGATIVE
Comment: NEGATIVE
Comment: NEGATIVE
Comment: NEGATIVE
Comment: NORMAL
Neisseria Gonorrhea: NEGATIVE
Trichomonas: NEGATIVE

## 2022-11-23 LAB — SARS CORONAVIRUS 2 (TAT 6-24 HRS): SARS Coronavirus 2: NEGATIVE

## 2022-11-23 MED ORDER — METRONIDAZOLE 500 MG PO TABS: 500.0000 mg | ORAL_TABLET | Freq: Two times a day (BID) | ORAL | 0 refills | Status: DC

## 2023-04-03 ENCOUNTER — Emergency Department (HOSPITAL_COMMUNITY)
Admission: EM | Admit: 2023-04-03 | Discharge: 2023-04-03 | Disposition: A | Discharge status: Home / Self Care | Payer: BLUE CROSS/BLUE SHIELD | Attending: Emergency Medicine | Admitting: Emergency Medicine

## 2023-04-03 ENCOUNTER — Other Ambulatory Visit: Payer: Self-pay

## 2023-04-03 ENCOUNTER — Encounter (HOSPITAL_COMMUNITY): Payer: Self-pay

## 2023-04-03 DIAGNOSIS — Z23 Encounter for immunization: Secondary | ICD-10-CM | POA: Insufficient documentation

## 2023-04-03 DIAGNOSIS — S51812A Laceration without foreign body of left forearm, initial encounter: Secondary | ICD-10-CM | POA: Insufficient documentation

## 2023-04-03 DIAGNOSIS — W268XXA Contact with other sharp object(s), not elsewhere classified, initial encounter: Secondary | ICD-10-CM | POA: Diagnosis not present

## 2023-04-03 DIAGNOSIS — N3000 Acute cystitis without hematuria: Secondary | ICD-10-CM | POA: Diagnosis not present

## 2023-04-03 DIAGNOSIS — S41112A Laceration without foreign body of left upper arm, initial encounter: Secondary | ICD-10-CM

## 2023-04-03 LAB — URINALYSIS, ROUTINE W REFLEX MICROSCOPIC
Bilirubin Urine: NEGATIVE
Glucose, UA: NEGATIVE mg/dL
Hgb urine dipstick: NEGATIVE
Ketones, ur: NEGATIVE mg/dL
Leukocytes,Ua: NEGATIVE
Nitrite: POSITIVE — AB
Protein, ur: 100 mg/dL — AB
Specific Gravity, Urine: 1.027 (ref 1.005–1.030)
pH: 5 (ref 5.0–8.0)

## 2023-04-03 LAB — PREGNANCY, URINE: Preg Test, Ur: NEGATIVE

## 2023-04-03 MED ORDER — NITROFURANTOIN MONOHYD MACRO 100 MG PO CAPS
100.0000 mg | ORAL_CAPSULE | Freq: Once | ORAL | Status: AC
  Administered 2023-04-03: 100 mg via ORAL
  Filled 2023-04-03 (×2): qty 1

## 2023-04-03 MED ORDER — NITROFURANTOIN MONOHYD MACRO 100 MG PO CAPS: 100.0000 mg | ORAL_CAPSULE | Freq: Two times a day (BID) | ORAL | 0 refills | Status: AC

## 2023-04-03 MED ORDER — TETANUS-DIPHTH-ACELL PERTUSSIS 5-2.5-18.5 LF-MCG/0.5 IM SUSY
0.5000 mL | PREFILLED_SYRINGE | Freq: Once | INTRAMUSCULAR | Status: AC
  Administered 2023-04-03: 0.5 mL via INTRAMUSCULAR
  Filled 2023-04-03: qty 0.5

## 2023-04-03 NOTE — Discharge Instructions (Addendum)
It was a pleasure caring for you today.  laceration was greater than 8hrs old and not appropriate for suture repair at this time. Your urinalysis showed UTI. I am sending you home with antibiotics. Please take entire course even if symptoms resolve.  Seek emergency care if experiencing any new or worsening symptoms.
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
## 2023-04-03 NOTE — ED Notes (Signed)
Patient irate walking around asking for her papers. Tried to help [facility] and medicate and discharge her but she left. Will let AC know.

## 2023-04-03 NOTE — ED Notes (Signed)
Patient returned, gave her medications and discharged her.

## 2023-04-03 NOTE — ED Provider Notes (Signed)
[place] [facility]MENT AT [facility] Provider Note   CSN: [id number] Arrival date & time: 04/03/23  1644     History {Add pertinent medical, surgical, social history, OB history to HPI:1} Chief Complaint  Patient presents with   Laceration   Exposure to STD    Patricia Dunn is a 37 y.o. female presents to the ED complaining of 2 cm laceration that happened last night around 9pm.  Patient also mentions that she is been experiencing malodorous urine x1 week.  Patient initially requesting STD testing, but later refused. Patient does not remember last tetanus booster.  Patient denying fever, numbness/paresthesias of hand, arm swelling.  Denies dysuria, hematuria, vaginal discharge, vaginal itching/irritation, abdominal pain.   Laceration Exposure to STD       Home Medications Prior to Admission medications   Medication Sig Start Date End Date Taking? Authorizing Provider  nitrofurantoin, macrocrystal-monohydrate, (MACROBID) 100 MG capsule Take 1 capsule (100 mg total) by mouth 2 (two) times daily for 5 days. 04/03/23 04/08/23 Yes Annissa Andreoni, Charlotte Sanes F, PA-C  albuterol (PROVENTIL HFA;VENTOLIN HFA) 108 (90 BASE) MCG/ACT inhaler Inhale 2 puffs into the lungs every 6 (six) hours as needed for wheezing or shortness of breath. Patient not taking: Reported on 08/06/2021 10/12/14   Jaynie Crumble, PA-C  cyclobenzaprine (FLEXERIL) 10 MG tablet Take 1 tablet (10 mg total) by mouth 3 (three) times daily as needed for muscle spasms. Patient not taking: Reported on 08/06/2021 06/18/20   Merrilee Jansky, MD  ibuprofen (ADVIL) 600 MG tablet Take 1 tablet (600 mg total) by mouth every 6 (six) hours as needed. Patient not taking: Reported on 08/06/2021 06/18/20   Merrilee Jansky, MD  metroNIDAZOLE (FLAGYL) 500 MG tablet Take 1 tablet (500 mg total) by mouth 2 (two) times daily. 11/23/22   LampteyBritta Mccreedy, MD  oxyCODONE-acetaminophen (PERCOCET/ROXICET) 5-325 MG tablet Take  1-2 tablets by mouth every 4 (four) hours as needed for severe pain. Patient not taking: Reported on 08/06/2021 04/24/16   Elpidio Anis, PA-C  QUEtiapine (SEROQUEL) 50 MG tablet Take 1 tablet (50 mg total) by mouth at bedtime. 08/08/21   Maryagnes Amos, FNP      Allergies    Patient has no known allergies.    Review of Systems   Review of Systems  Skin:        laceration    Physical Exam Updated Vital Signs BP (!) 112/97 (BP Location: Right Arm)   Pulse 82   Temp 98.4 F (36.9 C) (Oral)   Resp 18   SpO2 100%  Physical Exam Vitals and nursing note reviewed.  Constitutional:      General: She is not in acute distress. HENT:     Head: Normocephalic and atraumatic.     Mouth/Throat:     Mouth: Mucous membranes are moist.     Pharynx: No oropharyngeal exudate or posterior oropharyngeal erythema.  Eyes:     General: No scleral icterus.       Right eye: No discharge.        Left eye: No discharge.     Conjunctiva/sclera: Conjunctivae normal.  Cardiovascular:     Rate and Rhythm: Normal rate and regular rhythm.     Pulses: Normal pulses.     Heart sounds: Normal heart sounds. No murmur heard. Pulmonary:     Effort: Pulmonary effort is normal. No respiratory distress.     Breath sounds: Normal breath sounds. No wheezing, rhonchi or rales.  Abdominal:  Tenderness: There is no abdominal tenderness.     Comments: No tenderness to palpation.  Musculoskeletal:     Right lower leg: No edema.     Left lower leg: No edema.  Skin:    General: Skin is warm and dry.     Findings: No rash.     Comments: 2cm superficial laceration on patient left forearm without swelling, bleeding, purulence, or surrounding erythema. +2 radial pulse and sensation to light touch intact of left hand. Left forearm non-tense. Active wrist ROM intact.  Neurological:     General: No focal deficit present.     Mental Status: She is alert. Mental status is at baseline.     Sensory: No sensory  deficit.  Psychiatric:        Mood and Affect: Mood normal.     ED Results / Procedures / Treatments   Labs (all labs ordered are listed, but only abnormal results are displayed) Labs Reviewed  URINALYSIS, ROUTINE W REFLEX MICROSCOPIC - Abnormal; Notable for the following components:      Result Value   Color, Urine AMBER (*)    APPearance HAZY (*)    Protein, ur 100 (*)    Nitrite POSITIVE (*)    Bacteria, UA MANY (*)    All other components within normal limits  PREGNANCY, URINE    EKG None  Radiology No results found.  Procedures Procedures  {Document cardiac monitor, telemetry assessment procedure when appropriate:1}  Medications Ordered in ED Medications  Tdap (BOOSTRIX) injection 0.5 mL (has no administration in time range)  nitrofurantoin (macrocrystal-monohydrate) (MACROBID) capsule 100 mg (has no administration in time range)    ED Course/ Medical Decision Making/ A&P   {   Click here for ABCD2, HEART and other calculatorsREFRESH Note before signing :1}                          Medical Decision Making Amount and/or Complexity of Data Reviewed Labs: ordered.  Risk Prescription drug management.   This patient presents to the ED for concern of a  2 cm simple laceration to their left forearm, this involves an extensive number of treatment options, and is a complaint that carries with it a high risk of complications and morbidity.  The differential diagnosis includes foreign body, fracture, NV compromise. These are considered less likely due to history of present illness and physical exam findings.   Co morbidities that complicate the patient evaluation  none    Lab Tests:  I Ordered, and personally interpreted labs.  The pertinent results include:   -UA: concerning for UTI -pregnancy: negative   Problem List / ED Course / Critical interventions / Medication management  Patient presented for a 2cm laceration to their left forearm. They are  neurovascularly intact. Tetanus is provided in ED today. Patient is in no distress. Laceration without bleeding or concerning signs for infection. Patient afebrile with stable vitals. Laceration is 54hours old and is not appropriate for suture repair at this time. Will close laceration with steri-strips.  Provided patient with tetanus booster today. Of note, patient complaining of malodorous urine.  UA concerning for UTI.  Provided patient with a dose of Macrobid here in ED and discharging her with rest of antibiotic course. Patient/family educated about specific return precautions for given chief complaint and symptoms.  Patient/family educated about follow-up with PCP and ***. ***Patient explicitly told about *** incidental findings and importance of *** follow up. Laceration repaired  without complication and with pulse, motor, sensation intact. Patient is stable for discharge at this time. Patient/family expressed understanding of return precautions and need for follow-up. Patient spoken to regarding all imaging and laboratory results and appropriate follow up for these results. All education provided in verbal form with additional information in written form. Time was allowed for answering of patient questions. Patient discharged. I ordered medication including ***  for ***  Reevaluation of the patient after these medicines showed that the patient {resolved/improved/worsened:23923::"improved"} I have reviewed the patients home medicines and have made adjustments as needed Patient was given return precautions. Patient stable for discharge at this time.  Patient verbalized understanding of plan.   Social Determinants of Health:  ***   Test / Admission - Considered:  ***    {Document critical care time when appropriate:1} {Document review of labs and clinical decision tools ie heart score, Chads2Vasc2 etc:1}  {Document your independent review of radiology images, and any outside  records:1} {Document your discussion with family members, caretakers, and with consultants:1} {Document social determinants of health affecting pt's care:1} {Document your decision making why or why not admission, treatments were needed:1} Final Clinical Impression(s) / ED Diagnoses Final diagnoses:  Laceration of left upper extremity, initial encounter  Acute cystitis without hematuria    Rx / DC Orders ED Discharge Orders          Ordered    nitrofurantoin, macrocrystal-monohydrate, (MACROBID) 100 MG capsule  2 times daily        04/03/23 1951

## 2023-04-18 ENCOUNTER — Other Ambulatory Visit: Payer: Self-pay

## 2023-04-18 ENCOUNTER — Emergency Department (HOSPITAL_COMMUNITY)
Admission: EM | Admit: 2023-04-18 | Discharge: 2023-04-19 | Discharge status: Against Medical Advice | Payer: BLUE CROSS/BLUE SHIELD | Attending: Emergency Medicine | Admitting: Emergency Medicine

## 2023-04-18 ENCOUNTER — Encounter (HOSPITAL_COMMUNITY): Payer: Self-pay | Admitting: *Deleted

## 2023-04-18 ENCOUNTER — Emergency Department (HOSPITAL_COMMUNITY): Payer: BLUE CROSS/BLUE SHIELD

## 2023-04-18 DIAGNOSIS — R4182 Altered mental status, unspecified: Secondary | ICD-10-CM

## 2023-04-18 DIAGNOSIS — R42 Dizziness and giddiness: Secondary | ICD-10-CM | POA: Insufficient documentation

## 2023-04-18 DIAGNOSIS — J45909 Unspecified asthma, uncomplicated: Secondary | ICD-10-CM | POA: Insufficient documentation
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
## 2023-04-19 NOTE — Discharge Instructions (Signed)
You presented with altered mental status, you have refused further workup at this time, I cannot fully guarantee that there is no life or limb threatening abnormality.  I recommend that if you have worsening symptoms, or you are not feeling well, I recommend they go to your nearest hospital for further assessment.  Come back to the emergency department if you develop chest pain, shortness of breath, severe abdominal pain, uncontrolled nausea, vomiting, diarrhea.

## 2023-05-03 ENCOUNTER — Ambulatory Visit (HOSPITAL_COMMUNITY)
Admission: EM | Admit: 2023-05-03 | Discharge: 2023-05-03 | Disposition: A | Discharge status: Home / Self Care | Payer: BLUE CROSS/BLUE SHIELD | Attending: Family Medicine | Admitting: Family Medicine

## 2023-05-03 ENCOUNTER — Encounter (HOSPITAL_COMMUNITY): Payer: Self-pay

## 2023-05-03 DIAGNOSIS — W540XXA Bitten by dog, initial encounter: Secondary | ICD-10-CM | POA: Diagnosis not present

## 2023-05-03 DIAGNOSIS — S91352A Open bite, left foot, initial encounter: Secondary | ICD-10-CM | POA: Insufficient documentation

## 2023-05-03 DIAGNOSIS — S91052A Open bite, left ankle, initial encounter: Secondary | ICD-10-CM | POA: Diagnosis not present

## 2023-05-03 DIAGNOSIS — S61254A Open bite of right ring finger without damage to nail, initial encounter: Secondary | ICD-10-CM | POA: Diagnosis not present

## 2023-05-03 DIAGNOSIS — Z203 Contact with and (suspected) exposure to rabies: Secondary | ICD-10-CM | POA: Insufficient documentation

## 2023-05-03 DIAGNOSIS — S71151A Open bite, right thigh, initial encounter: Secondary | ICD-10-CM | POA: Diagnosis not present

## 2023-05-03 DIAGNOSIS — Z23 Encounter for immunization: Secondary | ICD-10-CM | POA: Diagnosis not present

## 2023-05-03 DIAGNOSIS — S71152A Open bite, left thigh, initial encounter: Secondary | ICD-10-CM | POA: Insufficient documentation

## 2023-05-03 DIAGNOSIS — S61256A Open bite of right little finger without damage to nail, initial encounter: Secondary | ICD-10-CM | POA: Diagnosis not present

## 2023-05-03 LAB — POCT URINALYSIS DIP (MANUAL ENTRY)
Blood, UA: NEGATIVE
Glucose, UA: NEGATIVE mg/dL
Ketones, POC UA: NEGATIVE mg/dL
Nitrite, UA: POSITIVE — AB
Spec Grav, UA: 1.025 (ref 1.010–1.025)
Urobilinogen, UA: 1 E.U./dL
pH, UA: 6 (ref 5.0–8.0)

## 2023-05-03 LAB — POCT URINE PREGNANCY: Preg Test, Ur: NEGATIVE

## 2023-05-03 MED ORDER — KETOROLAC TROMETHAMINE 60 MG/2ML IM SOLN
60.0000 mg | Freq: Once | INTRAMUSCULAR | Status: AC
  Administered 2023-05-03: 60 mg via INTRAMUSCULAR

## 2023-05-03 MED ORDER — RABIES IMMUNE GLOBULIN 150 UNIT/ML IM INJ
INJECTION | INTRAMUSCULAR | Status: AC
  Filled 2023-05-03: qty 10

## 2023-05-03 MED ORDER — RABIES VACCINE, PCEC IM SUSR
INTRAMUSCULAR | Status: AC
  Filled 2023-05-03: qty 1

## 2023-05-03 MED ORDER — RABIES VACCINE, PCEC IM SUSR
1.0000 mL | Freq: Once | INTRAMUSCULAR | Status: AC
  Administered 2023-05-03: 1 mL via INTRAMUSCULAR

## 2023-05-03 MED ORDER — CEFTRIAXONE SODIUM 1 G IJ SOLR
1.0000 g | Freq: Once | INTRAMUSCULAR | Status: AC
  Administered 2023-05-03: 1 g via INTRAMUSCULAR

## 2023-05-03 MED ORDER — KETOROLAC TROMETHAMINE 60 MG/2ML IM SOLN
INTRAMUSCULAR | Status: AC
  Filled 2023-05-03: qty 2

## 2023-05-03 MED ORDER — LIDOCAINE HCL (PF) 1 % IJ SOLN
INTRAMUSCULAR | Status: AC
  Filled 2023-05-03: qty 2

## 2023-05-03 MED ORDER — NAPROXEN 375 MG PO TABS: 375.0000 mg | ORAL_TABLET | Freq: Two times a day (BID) | ORAL | 0 refills | Status: DC

## 2023-05-03 MED ORDER — RABIES IMMUNE GLOBULIN 150 UNIT/ML IM INJ
20.0000 [IU]/kg | INJECTION | Freq: Once | INTRAMUSCULAR | Status: AC
  Administered 2023-05-03: 1200 [IU]

## 2023-05-03 MED ORDER — CEFTRIAXONE SODIUM 1 G IJ SOLR
INTRAMUSCULAR | Status: AC
  Filled 2023-05-03: qty 10

## 2023-05-03 MED ORDER — AMOXICILLIN-POT CLAVULANATE 875-125 MG PO TABS: 1.0000 | ORAL_TABLET | Freq: Two times a day (BID) | ORAL | 0 refills | Status: AC

## 2023-05-03 NOTE — ED Provider Notes (Signed)
[facility]    CSN: [id number] Arrival date & time: 05/03/23  1627      History   Chief Complaint Chief Complaint  Patient presents with  . Animal Bite    HPI Patricia Dunn is a 37 y.o. female.   HPI    Past Medical History:  Diagnosis Date  . Asthma   . Headache(784.0)    migraines  . Smoker     Patient Active Problem List   Diagnosis Date Noted  . Cocaine use 08/29/2020    Class: Acute  . 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 Marijuana use 08/29/2020    Class: Acute  . Substance induced mood disorder (HCC) 08/29/2020    Class: Acute  . Unresponsiveness 01/17/2014  . Drug overdose 01/17/2014  . Sterilization 07/09/2012  . Prenatal care insufficient 06/05/2012    Past Surgical History:  Procedure Laterality Date  . NO PAST SURGERIES    . TUBAL LIGATION  07/09/2012   Procedure: POST PARTUM TUBAL LIGATION;  Surgeon: Catalina Antigua, MD;  Location: [facility];  Service: Gynecology;  Laterality: Bilateral;  . VAGINAL DELIVERY     X 4    OB History     Gravida  7   Para  5   Term  5   Preterm  0   AB  2   Living  5      SAB  2   IAB  0   Ectopic  0   Multiple  0   Live Births  1            Home Medications    Prior to Admission medications   Medication Sig Start Date End Date Taking? Authorizing Provider  albuterol (PROVENTIL HFA;VENTOLIN HFA) 108 (90 BASE) MCG/ACT inhaler Inhale 2 puffs into the lungs every 6 (six) hours as needed for wheezing or shortness of breath. Patient not taking: Reported on 08/06/2021 10/12/14   Jaynie Crumble, PA-C  cyclobenzaprine (FLEXERIL) 10 MG tablet Take 1 tablet (10 mg total) by mouth 3 (three) times daily as needed for muscle spasms. Patient not taking: Reported on 08/06/2021 06/18/20   Merrilee Jansky, MD  ibuprofen (ADVIL) 600 MG tablet Take 1 tablet (600 mg total) by mouth every 6 (six) hours as needed. Patient not taking: Reported on  08/06/2021 06/18/20   Merrilee Jansky, MD  metroNIDAZOLE (FLAGYL) 500 MG tablet Take 1 tablet (500 mg total) by mouth 2 (two) times daily. 11/23/22   LampteyBritta Mccreedy, MD  oxyCODONE-acetaminophen (PERCOCET/ROXICET) 5-325 MG tablet Take 1-2 tablets by mouth every 4 (four) hours as needed for severe pain. Patient not taking: Reported on 08/06/2021 04/24/16   Elpidio Anis, PA-C  QUEtiapine (SEROQUEL) 50 MG tablet Take 1 tablet (50 mg total) by mouth at bedtime. 08/08/21   Maryagnes Amos, FNP    Family History Family History  Problem Relation Age of Onset  . Other Neg Hx     Social History Social History   Tobacco Use  . Smoking status: Every Day    Current packs/day: 1.00    Types: Cigarettes  . Smokeless tobacco: Never  Vaping Use  . Vaping status: Never Used  Substance Use Topics  . Alcohol use: Yes  . Drug use: Yes    Types: Marijuana, Cocaine     Allergies   Patient has no known allergies.   Review of Systems Review of Systems   Physical Exam Triage Vital  Signs ED Triage Vitals  Encounter Vitals Group     BP 05/03/23 1741 120/82     Systolic BP Percentile --      Diastolic BP Percentile --      Pulse Rate 05/03/23 1741 80     Resp 05/03/23 1741 14     Temp 05/03/23 1741 97.6 F (36.4 C)     Temp Source 05/03/23 1741 Oral     SpO2 05/03/23 1741 98 %     Weight 05/03/23 1747 134 lb (60.8 kg)     Height --      Head Circumference --      Peak Flow --      Pain Score 05/03/23 1744 10     Pain Loc --      Pain Education --      Exclude from Growth Chart --    No data found.  Updated Vital Signs BP 120/82 (BP Location: Left Arm)   Pulse 80   Temp 97.6 F (36.4 C) (Oral)   Resp 14   Wt 134 lb (60.8 kg)   LMP 03/13/2023   SpO2 98%   BMI 23.00 kg/m   Visual Acuity Right Eye Distance:   Left Eye Distance:   Bilateral Distance:    Right Eye Near:   Left Eye Near:    Bilateral Near:     Physical Exam   UC Treatments / Results   Labs (all labs ordered are listed, but only abnormal results are displayed) Labs Reviewed - No data to display  EKG   Radiology No results found.  Procedures Procedures (including critical care time)  Medications Ordered in UC Medications - No data to display  Initial Impression / Assessment and Plan / UC Course  I have reviewed the triage vital signs and the nursing notes.  Pertinent labs & imaging results that were available during my care of the patient were reviewed by me and considered in my medical decision making (see chart for details).     *** Final Clinical Impressions(s) / UC Diagnoses   Final diagnoses:  None   Discharge Instructions   None    ED Prescriptions   None    PDMP not reviewed this encounter.

## 2023-05-03 NOTE — ED Triage Notes (Signed)
Patient reports that she was bitten by her friend's dog and states the dog did not have any vaccinations. Patient has a bite marks to the left  and right thigh, left ankle and foot, right proximal forearm, and the right 4th and 5th finger.  Patient denies taking any medication today.

## 2023-05-03 NOTE — Discharge Instructions (Addendum)
Continue to monitor your wounds while taking antibiotics for signs of infection.-Redness, swelling, purulent drainage, chills or fever. These symptoms would be indication to go to the Emergency department.  Cleanse wounds daily with regular warm soap and water, and cover with clean bandages.  Meds ordered this encounter  Medications   rabies immune globulin (HYPERRAB/KEDRAB) injection 1,200 Units   rabies vaccine (RABAVERT) injection 1 mL   ketorolac (TORADOL) injection 60 mg   cefTRIAXone (ROCEPHIN) injection 1 g   amoxicillin-clavulanate (AUGMENTIN) 875-125 MG tablet    Sig: Take 1 tablet by mouth every 12 (twelve) hours for 10 days.    Dispense:  20 tablet    Refill:  0    In addition to the human rabies immune globulin (HRIG), you were given the first dose of the rabies vaccine today (Day 0).  Please return here on the following dates to complete the rabies series: Day 3: 05/06/2023 Day 7: 05/10/2023 Day 14: 05/17/2023

## 2023-05-05 LAB — URINE CULTURE: Special Requests: NORMAL

## 2023-05-06 ENCOUNTER — Ambulatory Visit (HOSPITAL_COMMUNITY)
Admission: EM | Admit: 2023-05-06 | Discharge: 2023-05-06 | Disposition: A | Discharge status: Home / Self Care | Payer: BLUE CROSS/BLUE SHIELD | Attending: Internal Medicine | Admitting: Internal Medicine

## 2023-05-06 DIAGNOSIS — Z203 Contact with and (suspected) exposure to rabies: Secondary | ICD-10-CM | POA: Diagnosis not present

## 2023-05-06 LAB — URINE CULTURE: Culture: >=100000 — AB

## 2023-05-06 MED ORDER — RABIES VACCINE, PCEC IM SUSR
INTRAMUSCULAR | Status: AC
  Filled 2023-05-06: qty 1

## 2023-05-06 MED ORDER — RABIES VACCINE, PCEC IM SUSR
1.0000 mL | Freq: Once | INTRAMUSCULAR | Status: AC
  Administered 2023-05-06: 1 mL via INTRAMUSCULAR

## 2023-05-06 NOTE — ED Notes (Signed)
Patient given day 3 rabies vaccine in the left deltoid. Tolerated well.

## 2023-05-06 NOTE — ED Triage Notes (Signed)
Patient presenting for day 3 rabies injection. No reaction to the previous injection.

## 2023-05-13 ENCOUNTER — Ambulatory Visit (HOSPITAL_COMMUNITY): Payer: BLUE CROSS/BLUE SHIELD

## 2023-06-28 ENCOUNTER — Emergency Department (HOSPITAL_COMMUNITY): Payer: 59

## 2023-06-28 ENCOUNTER — Emergency Department (HOSPITAL_COMMUNITY): Admission: EM | Admit: 2023-06-28 | Discharge: 2023-06-28 | Discharge status: Against Medical Advice | Payer: 59

## 2023-06-28 ENCOUNTER — Other Ambulatory Visit: Payer: Self-pay

## 2023-06-28 DIAGNOSIS — Z5329 Procedure and treatment not carried out because of patient's decision for other reasons: Secondary | ICD-10-CM | POA: Diagnosis not present

## 2023-06-28 DIAGNOSIS — R1032 Left lower quadrant pain: Secondary | ICD-10-CM | POA: Insufficient documentation

## 2023-06-28 DIAGNOSIS — J45909 Unspecified asthma, uncomplicated: Secondary | ICD-10-CM | POA: Insufficient documentation

## 2023-06-28 DIAGNOSIS — R079 Chest pain, unspecified: Secondary | ICD-10-CM | POA: Diagnosis not present

## 2023-06-28 DIAGNOSIS — R0789 Other chest pain: Secondary | ICD-10-CM | POA: Diagnosis not present

## 2023-06-28 LAB — CBC
HCT: 45.5 % (ref 36.0–46.0)
Hemoglobin: 14.8 g/dL (ref 12.0–15.0)
MCH: 28.6 pg (ref 26.0–34.0)
MCHC: 32.5 g/dL (ref 30.0–36.0)
MCV: 87.8 fL (ref 80.0–100.0)
Platelets: 349 10*3/uL (ref 150–400)
RBC: 5.18 MIL/uL — ABNORMAL HIGH (ref 3.87–5.11)
RDW: 12 % (ref 11.5–15.5)
WBC: 6.4 10*3/uL (ref 4.0–10.5)
nRBC: 0 % (ref 0.0–0.2)

## 2023-06-28 LAB — COMPREHENSIVE METABOLIC PANEL
ALT: 15 U/L (ref 0–44)
AST: 20 U/L (ref 15–41)
Albumin: 4.1 g/dL (ref 3.5–5.0)
Alkaline Phosphatase: 66 U/L (ref 38–126)
Anion gap: 13 (ref 5–15)
BUN: 7 mg/dL (ref 6–20)
CO2: 25 mmol/L (ref 22–32)
Calcium: 9.4 mg/dL (ref 8.9–10.3)
Chloride: 101 mmol/L (ref 98–111)
Creatinine, Ser: 0.72 mg/dL (ref 0.44–1.00)
GFR, Estimated: >60 mL/min (ref >60)
Glucose, Bld: 85 mg/dL (ref 70–99)
Potassium: 3.9 mmol/L (ref 3.5–5.1)
Sodium: 139 mmol/L (ref 135–145)
Total Bilirubin: 0.4 mg/dL (ref 0.3–1.2)
Total Protein: 7.4 g/dL (ref 6.5–8.1)

## 2023-06-28 LAB — TROPONIN I (HIGH SENSITIVITY)
Troponin I (High Sensitivity): 3 ng/L (ref <18)
Troponin I (High Sensitivity): <2 ng/L (ref <18)

## 2023-06-28 LAB — URINALYSIS, ROUTINE W REFLEX MICROSCOPIC
Bilirubin Urine: NEGATIVE
Glucose, UA: NEGATIVE mg/dL
Hgb urine dipstick: NEGATIVE
Ketones, ur: 5 mg/dL — AB
Leukocytes,Ua: NEGATIVE
Nitrite: NEGATIVE
Protein, ur: 30 mg/dL — AB
Specific Gravity, Urine: 1.021 (ref 1.005–1.030)
pH: 7 (ref 5.0–8.0)

## 2023-06-28 LAB — WET PREP, GENITAL
Clue Cells Wet Prep HPF POC: NONE SEEN
Sperm: NONE SEEN
Trich, Wet Prep: NONE SEEN
WBC, Wet Prep HPF POC: <10 (ref <10)
Yeast Wet Prep HPF POC: NONE SEEN

## 2023-06-28 LAB — HCG, SERUM, QUALITATIVE: Preg, Serum: NEGATIVE

## 2023-06-28 LAB — LIPASE, BLOOD: Lipase: 21 U/L (ref 11–51)

## 2023-06-28 MED ORDER — ONDANSETRON 4 MG PO TBDP
4.0000 mg | ORAL_TABLET | Freq: Once | ORAL | Status: AC
  Administered 2023-06-28: 4 mg via ORAL
  Filled 2023-06-28: qty 1

## 2023-06-28 MED ORDER — MORPHINE SULFATE (PF) 4 MG/ML IV SOLN
4.0000 mg | Freq: Once | INTRAVENOUS | Status: AC
  Administered 2023-06-28: 4 mg via INTRAVENOUS
  Filled 2023-06-28: qty 1

## 2023-06-28 MED ORDER — KETOROLAC TROMETHAMINE 15 MG/ML IJ SOLN
15.0000 mg | Freq: Once | INTRAMUSCULAR | Status: AC
  Administered 2023-06-28: 15 mg via INTRAVENOUS
  Filled 2023-06-28: qty 1

## 2023-06-28 MED ORDER — IOHEXOL 350 MG/ML SOLN
60.0000 mL | Freq: Once | INTRAVENOUS | Status: AC | PRN
  Administered 2023-06-28: 60 mL via INTRAVENOUS

## 2023-06-28 MED ORDER — HYDROCODONE-ACETAMINOPHEN 5-325 MG PO TABS
1.0000 | ORAL_TABLET | Freq: Once | ORAL | Status: AC
  Administered 2023-06-28: 1 via ORAL
  Filled 2023-06-28: qty 1

## 2023-06-28 MED ORDER — SODIUM CHLORIDE 0.9 % IV BOLUS
1000.0000 mL | Freq: Once | INTRAVENOUS | Status: AC
  Administered 2023-06-28: 1000 mL via INTRAVENOUS

## 2023-06-28 NOTE — ED Notes (Signed)
Shift report received, assumed care of patient at this time 

## 2023-06-28 NOTE — ED Provider Notes (Addendum)
Patient's care assumed at 4 PM.  Patient is awaiting CT scan of abdomen due to left lower quadrant abdominal pain.  She has a normal white blood cell count she has normal chemistries.  Ultrasound by radiologist is unremarkable.  RN reported earlier patient is very upset.  I spoke with her she complains of pain in her left lower abdomen.  I will give her a dose of hydrocodone here.  Patient is reported to have been verbally abusive with staff.  Contacted radiology due to delays reading CT scan.  I reassured patient that radiology has agreed to make her read a priority.  Staff reported to me that patient left AMA.  Patient CT scan was reviewed CT abdomen shows no acute abnormality.   Elson Areas, PA-C 06/28/23 1929    Osie Cheeks 06/28/23 1950    Terald Sleeper, MD 06/29/23 2006

## 2023-06-28 NOTE — ED Notes (Signed)
Awaiting patient from lobby 
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
## 2023-06-28 NOTE — ED Triage Notes (Signed)
Pt. Stated, I woke up this morning and my lower stomach hurts and Im N/V , my chest hurts also.

## 2023-06-28 NOTE — ED Notes (Signed)
Patient started yelling at various staff members. Patient reported to NT that she was tired of waiting and proceeded to walk out.

## 2023-06-28 NOTE — ED Notes (Signed)
Patient transported to CT 

## 2023-06-28 NOTE — ED Notes (Addendum)
Pt becoming extremely agitated.  Pt called this RN "stupid bitch" and has been holding up her middle finger at this RN.  MD Trifan & PA Cucumber notified and aware.  Charge Artist notified and aware.

## 2023-06-28 NOTE — ED Notes (Signed)
Patient transported to Ultrasound 

## 2023-06-28 NOTE — ED Provider Notes (Signed)
[facility] [place] Provider Note   CSN: [id number] Arrival date & time: 06/28/23  1011     History  Chief Complaint  Patient presents with   Abdominal Pain   Chest Pain   Nausea    Patricia Dunn is a 37 y.o. female.  Patricia Dunn is a 37 y.o. female with a history of asthma and substance use, who presents to the emergency department for evaluation of lower abdominal pain.  Patient reports that she got woken from sleep this morning with pain in the left lower quadrant of her abdomen.  She reports with his she had some nausea and vomiting.  She denies any diarrhea and reports that her bowel movement yesterday was normal.  She also mentions that she had an episode of squeezing chest pain that was brief and resolved but the abdominal pain has not gone away.  She denies any dysuria or urinary frequency.  She is sexually active and recently got back with prior partner but has not noted any vaginal discharge or bleeding.  She has never had similar pain.  Patient reports a previous tubal ligation.  The history is provided by the patient and medical records.  Abdominal Pain Associated symptoms: chest pain, nausea and vomiting   Associated symptoms: no chills, no cough, no fever, no shortness of breath, no vaginal bleeding and no vaginal discharge   Chest Pain Associated symptoms: abdominal pain, nausea and vomiting   Associated symptoms: no cough, no fever and no shortness of breath        Home Medications Prior to Admission medications   Medication Sig Start Date End Date Taking? Authorizing Provider  albuterol (PROVENTIL HFA;VENTOLIN HFA) 108 (90 BASE) MCG/ACT inhaler Inhale 2 puffs into the lungs every 6 (six) hours as needed for wheezing or shortness of breath. Patient not taking: Reported on 08/06/2021 10/12/14   Jaynie Crumble, PA-C  cyclobenzaprine (FLEXERIL) 10 MG tablet Take 1 tablet (10 mg total) by mouth 3 (three) times daily  as needed for muscle spasms. Patient not taking: Reported on 08/06/2021 06/18/20   Merrilee Jansky, MD  ibuprofen (ADVIL) 600 MG tablet Take 1 tablet (600 mg total) by mouth every 6 (six) hours as needed. Patient not taking: Reported on 08/06/2021 06/18/20   Merrilee Jansky, MD  metroNIDAZOLE (FLAGYL) 500 MG tablet Take 1 tablet (500 mg total) by mouth 2 (two) times daily. 11/23/22   Merrilee Jansky, MD  naproxen (NAPROSYN) 375 MG tablet Take 1 tablet (375 mg total) by mouth 2 (two) times daily. 05/03/23   Bing Neighbors, NP  oxyCODONE-acetaminophen (PERCOCET/ROXICET) 5-325 MG tablet Take 1-2 tablets by mouth every 4 (four) hours as needed for severe pain. Patient not taking: Reported on 08/06/2021 04/24/16   Elpidio Anis, PA-C  QUEtiapine (SEROQUEL) 50 MG tablet Take 1 tablet (50 mg total) by mouth at bedtime. 08/08/21   Maryagnes Amos, FNP      Allergies    Patient has no known allergies.    Review of Systems   Review of Systems  Constitutional:  Negative for chills and fever.  Respiratory:  Negative for cough and shortness of breath.   Cardiovascular:  Positive for chest pain.  Gastrointestinal:  Positive for abdominal pain, nausea and vomiting.  Genitourinary:  Positive for pelvic pain. Negative for vaginal bleeding and vaginal discharge.  Musculoskeletal:  Negative for arthralgias and myalgias.  All other systems reviewed and are negative.   Physical Exam Updated  Vital Signs BP (!) 147/109 (BP Location: Right Arm)   Pulse 79   Temp 98.4 F (36.9 C) (Oral)   Resp (!) 22   Ht 5\' 4"  (1.626 m)   Wt 60.8 kg   LMP 06/18/2023   SpO2 100%   BMI 23.00 kg/m  Physical Exam Vitals and nursing note reviewed.  Constitutional:      General: She is not in acute distress.    Appearance: Normal appearance. She is well-developed. She is not ill-appearing or diaphoretic.  HENT:     Head: Normocephalic and atraumatic.  Eyes:     General:        Right eye: No discharge.         Left eye: No discharge.     Pupils: Pupils are equal, round, and reactive to light.  Cardiovascular:     Rate and Rhythm: Normal rate and regular rhythm.     Pulses: Normal pulses.     Heart sounds: Normal heart sounds.  Pulmonary:     Effort: Pulmonary effort is normal. No respiratory distress.     Breath sounds: Normal breath sounds. No wheezing or rales.     Comments: Respirations equal and unlabored, patient able to speak in full sentences, lungs clear to auscultation bilaterally  Abdominal:     General: Bowel sounds are normal. There is no distension.     Palpations: Abdomen is soft. There is no mass.     Tenderness: There is abdominal tenderness in the left lower quadrant. There is no guarding.     Comments: Abdomen soft, nondistended, bowel sounds present throughout, there is focal tenderness in the left lower quadrant, all other quadrants nontender to palpation, no guarding or rebound tenderness, no CVA tenderness  Genitourinary:    Comments: Chaperone present during pelvic exam. No external genital lesions noted. Speculum exam with a small amount of thin white discharge, no vaginal erythema noted, cervix appears normal. On bimanual exam patient without cervical motion tenderness, no focal adnexal tenderness or masses palpated bilaterally. Musculoskeletal:        General: No deformity.     Cervical back: Neck supple.  Skin:    General: Skin is warm and dry.     Capillary Refill: Capillary refill takes less than 2 seconds.  Neurological:     Mental Status: She is alert and oriented to person, place, and time.     Coordination: Coordination normal.     Comments: Speech is clear, able to follow commands CN III-XII intact Normal strength in upper and lower extremities bilaterally including dorsiflexion and plantar flexion, strong and equal grip strength Sensation normal to light and sharp touch Moves extremities without ataxia, coordination intact  Psychiatric:        Mood  and Affect: Mood normal.        Behavior: Behavior normal.     ED Results / Procedures / Treatments   Labs (all labs ordered are listed, but only abnormal results are displayed) Labs Reviewed  CBC - Abnormal; Notable for the following components:      Result Value   RBC 5.18 (*)    All other components within normal limits  WET PREP, GENITAL  LIPASE, BLOOD  COMPREHENSIVE METABOLIC PANEL  HCG, SERUM, QUALITATIVE  URINALYSIS, ROUTINE W REFLEX MICROSCOPIC  GC/CHLAMYDIA PROBE AMP ([place]) NOT AT [facility]  TROPONIN I (HIGH SENSITIVITY)  TROPONIN I (HIGH SENSITIVITY)    EKG EKG Interpretation Date/Time:  Thursday June 28 2023 11:38:27 EDT Ventricular Rate:  68 PR Interval:  150 QRS Duration:  78 QT Interval:  436 QTC Calculation: 464 R Axis:   90  Text Interpretation: Sinus rhythm Borderline right axis deviation Confirmed by Beckey Downing [phone]) on 06/28/2023 11:44:31 AM  Radiology No results found.  Procedures Procedures    Medications Ordered in ED Medications  ondansetron (ZOFRAN-ODT) disintegrating tablet 4 mg (4 mg Oral Given 06/28/23 1027)    ED Course/ Medical Decision Making/ A&P                                 Medical Decision Making Amount and/or Complexity of Data Reviewed Labs: ordered. Radiology: ordered.  Risk Prescription drug management.   Patient presents to the ED with complaints of abdominal pain. Patient nontoxic appearing, in no apparent distress, vitals WNL. On exam patient tender to palpation in the left lower quadrant, no peritoneal signs. Will evaluate with labs and pelvic ultrasound.  Patient also reported a brief episode of chest pain will check cardiac enzymes, chest x-ray and EKG.  For patient's left lower quadrant abdominal pain concern for for possible ovarian cyst, ovarian torsion, PID, diverticulitis, colitis, obstruction, perforation, gastroenteritis.  Patient's episode of chest pain was brief and resolved while abdominal  pain persisted so I feel that they are less likely related, patient is PERC negative, low suspicion for PE.  Considered potential ACS, pneumothorax, musculoskeletal pain, anxiety, GERD.  Additional history obtained:  Additional history obtained from chart review & nursing note review.   Lab Tests:  I Ordered, viewed, and interpreted labs, which included:  CBC: No leukocytosis and normal hemoglobin CMP: No electrolyte derangements, normal renal and liver function Lipase: WNL Troponin: Negative x 2 Preg test: Negative Wet prep: No evidence of WBCs or other abnormalities, GC/chlamydia pending but have low suspicion that this is contributing to patient's symptoms  Imaging Studies ordered:  I ordered imaging studies which included pelvic ultrasound, I independently reviewed, formal radiology impression shows:  No evidence of ovarian cysts or uterine fibroids, normal blood flow noted to bilateral ovaries. Given patient's focal left lower quadrant pain without explanation for symptoms on ultrasound we will proceed with CT  ED Course:  I ordered IV fluids, morphine and Zofran patient did not get much improvement in pain.  Patient received Toradol with significant improvement  At shift change care signed out to PA Langston Masker who will follow-up on pending CT scan if normal patient can be discharged home with outpatient follow-up.  Portions of this note were generated with Scientist, clinical (histocompatibility and immunogenetics). Dictation errors may occur despite best attempts at proofreading.           Final Clinical Impression(s) / ED Diagnoses Final diagnoses:  LLQ abdominal pain    Rx / DC Orders ED Discharge Orders     None         Legrand Rams 06/28/23 1633    Durwin Glaze, MD 06/29/23 1122

## 2023-06-29 LAB — GC/CHLAMYDIA PROBE AMP (~~LOC~~) NOT AT ARMC
Chlamydia: NEGATIVE
Comment: NEGATIVE
Comment: NORMAL
Neisseria Gonorrhea: POSITIVE — AB

## 2023-11-20 ENCOUNTER — Encounter (HOSPITAL_COMMUNITY): Payer: Self-pay

## 2023-11-20 ENCOUNTER — Ambulatory Visit (HOSPITAL_COMMUNITY)
Admission: EM | Admit: 2023-11-20 | Discharge: 2023-11-20 | Disposition: A | Discharge status: Home / Self Care | Payer: 59 | Attending: Family Medicine | Admitting: Family Medicine

## 2023-11-20 DIAGNOSIS — R509 Fever, unspecified: Secondary | ICD-10-CM | POA: Diagnosis not present

## 2023-11-20 DIAGNOSIS — Z202 Contact with and (suspected) exposure to infections with a predominantly sexual mode of transmission: Secondary | ICD-10-CM

## 2023-11-20 LAB — POCT URINALYSIS DIP (MANUAL ENTRY)
Bilirubin, UA: NEGATIVE
Blood, UA: NEGATIVE
Glucose, UA: NEGATIVE mg/dL
Ketones, POC UA: NEGATIVE mg/dL
Leukocytes, UA: NEGATIVE
Nitrite, UA: NEGATIVE
Protein Ur, POC: NEGATIVE mg/dL
Spec Grav, UA: 1.02 (ref 1.010–1.025)
Urobilinogen, UA: 0.2 U/dL
pH, UA: 7 (ref 5.0–8.0)

## 2023-11-20 LAB — POCT URINE PREGNANCY: Preg Test, Ur: NEGATIVE

## 2023-11-20 LAB — POCT INFLUENZA A/B
Influenza A, POC: NEGATIVE
Influenza B, POC: NEGATIVE

## 2023-11-20 MED ORDER — ACETAMINOPHEN 325 MG PO TABS
ORAL_TABLET | ORAL | Status: AC
  Filled 2023-11-20: qty 2

## 2023-11-20 MED ORDER — ACETAMINOPHEN 325 MG PO TABS
650.0000 mg | ORAL_TABLET | Freq: Once | ORAL | Status: AC
  Administered 2023-11-20: 650 mg via ORAL

## 2023-11-20 NOTE — ED Provider Notes (Addendum)
 [facility]    CSN: [id number] Arrival date & time: 11/20/23  1104      History   Chief Complaint Chief Complaint  Patient presents with   Exposure to STD    HPI Patricia Dunn is a 38 y.o. female.   HPI  She reports she has been having nausea, vomiting and diarrhea for the past few days She states she is febrile as of this afternoon  She denies abdominal pain, vaginal discharge changes, or vaginal pain  She report she has had vaginal bleeding with wiping for the past few weeks- sometimes 2-3 times per week   Her partner was tested for STDs earlier this week- unsure of results but she states he was potentially treated?     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    Class: Acute   Marijuana use 08/29/2020    Class: Acute   Substance induced mood disorder (HCC) 08/29/2020    Class: Acute   Unresponsiveness 01/17/2014   Drug overdose 01/17/2014   Encounter for sterilization 07/09/2012   Prenatal care insufficient 06/05/2012    Past Surgical History:  Procedure Laterality Date   NO PAST SURGERIES     TUBAL LIGATION  07/09/2012   Procedure: POST PARTUM TUBAL LIGATION;  Surgeon: Winton Felt, MD;  Location: [facility];  Service: Gynecology;  Laterality: Bilateral;   VAGINAL DELIVERY     X 4    OB History     Gravida  7   Para  5   Term  5   Preterm  0   AB  2   Living  5      SAB  2   IAB  0   Ectopic  0   Multiple  0   Live Births  1            Home Medications    Prior to Admission medications   Medication Sig Start Date End Date Taking? Authorizing Provider  albuterol  (PROVENTIL  HFA;VENTOLIN  HFA) 108 (90 BASE) MCG/ACT inhaler Inhale 2 puffs into the lungs every 6 (six) hours as needed for wheezing or shortness of  breath. Patient not taking: Reported on 08/06/2021 10/12/14   Kirichenko, Tatyana, PA-C  cyclobenzaprine  (FLEXERIL ) 10 MG tablet Take 1 tablet (10 mg total) by mouth 3 (three) times daily as needed for muscle spasms. Patient not taking: Reported on 08/06/2021 06/18/20   Blaise Aleene KIDD, MD  ibuprofen  (ADVIL ) 600 MG tablet Take 1 tablet (600 mg total) by mouth every 6 (six) hours as needed. Patient not taking: Reported on 08/06/2021 06/18/20   Blaise Aleene KIDD, MD    Family History Family History  Problem Relation Age of Onset   Other Neg Hx     Social History Social History   Tobacco Use   Smoking status: Every Day    Current packs/day: 1.00    Types: Cigarettes   Smokeless tobacco: Never  Vaping Use   Vaping status: Never Used  Substance Use Topics   Alcohol use: Yes   Drug use: Yes    Types: Marijuana, Cocaine     Allergies   Patient has no known allergies.   Review of Systems Review of Systems  Constitutional:  Positive for  fever.  Gastrointestinal:  Positive for diarrhea, nausea and vomiting. Negative for abdominal pain.  Genitourinary:  Positive for vaginal bleeding. Negative for dysuria, vaginal discharge and vaginal pain.     Physical Exam Triage Vital Signs ED Triage Vitals  Encounter Vitals Group     BP 11/20/23 1239 120/68     Systolic BP Percentile --      Diastolic BP Percentile --      Pulse Rate 11/20/23 1239 73     Resp 11/20/23 1239 18     Temp 11/20/23 1239 (!) 101 F (38.3 C)     Temp Source 11/20/23 1239 Oral     SpO2 11/20/23 1239 98 %     Weight --      Height --      Head Circumference --      Peak Flow --      Pain Score 11/20/23 1240 0     Pain Loc --      Pain Education --      Exclude from Growth Chart --    No data found.  Updated Vital Signs BP 120/68 (BP Location: Left Arm)   Pulse 73   Temp (!) 101 F (38.3 C) (Oral)   Resp 18   LMP  (LMP Unknown)   SpO2 98%   Breastfeeding No   Visual Acuity Right Eye  Distance:   Left Eye Distance:   Bilateral Distance:    Right Eye Near:   Left Eye Near:    Bilateral Near:     Physical Exam Vitals reviewed.  Constitutional:      General: She is awake.     Appearance: Normal appearance. She is well-developed and well-groomed.  HENT:     Head: Normocephalic and atraumatic.  Eyes:     General: Lids are normal. Gaze aligned appropriately.     Extraocular Movements: Extraocular movements intact.     Conjunctiva/sclera: Conjunctivae normal.  Pulmonary:     Effort: Pulmonary effort is normal.  Musculoskeletal:     Cervical back: Normal range of motion.  Neurological:     General: No focal deficit present.     Mental Status: She is alert and oriented to person, place, and time.     GCS: GCS eye subscore is 4. GCS verbal subscore is 5. GCS motor subscore is 6.     Cranial Nerves: No cranial nerve deficit, dysarthria or facial asymmetry.  Psychiatric:        Attention and Perception: Attention and perception normal.        Mood and Affect: Mood and affect normal.        Speech: Speech normal.        Behavior: Behavior normal. Behavior is cooperative.      UC Treatments / Results  Labs (all labs ordered are listed, but only abnormal results are displayed) Labs Reviewed  POCT URINE PREGNANCY  POCT URINALYSIS DIP (MANUAL ENTRY)  POCT INFLUENZA A/B  CERVICOVAGINAL ANCILLARY ONLY    EKG   Radiology No results found.  Procedures Procedures (including critical care time)  Medications Ordered in UC Medications  acetaminophen  (TYLENOL ) tablet 650 mg (650 mg Oral Given 11/20/23 1248)    Initial Impression / Assessment and Plan / UC Course  I have reviewed the triage vital signs and the nursing notes.  Pertinent labs & imaging results that were available during my care of the patient were reviewed by me and considered in my medical decision making (see chart for details).  Final Clinical Impressions(s) / UC Diagnoses   Final  diagnoses:  Possible exposure to STD  Fever, unspecified   Patient reports that her partner was recently tested for STDs.  She is not sure if he tested positive but she thinks that he might have been treated.  Will get cervicovaginal swab for testing.  Her urine pregnancy test was negative.  Results reviewed with patient during appointment.  Results of cervicovaginal swab to dictate further management As patient is febrile and reports nausea diarrhea we will get flu testing.  She reports that she has had the symptoms for several days-unsure of true chronicity or timeline.  Recommend continuing Tylenol  and ibuprofen  as needed for fever management.     Discharge Instructions      We will keep you updated on the results of your cervicovaginal swab.  If your results indicate that a medication is needed we will send that in for you to get at the pharmacy that is on file.      ED Prescriptions   None    PDMP not reviewed this encounter.   Marylene Rocky BRAVO, PA-C 11/20/23 1337    Ayva Veilleux, Rocky BRAVO, PA-C 11/20/23 1338

## 2023-11-20 NOTE — Discharge Instructions (Signed)
We will keep you updated on the results of your cervicovaginal swab.  If your results indicate that a medication is needed we will send that in for you to get at the pharmacy that is on file.

## 2023-11-20 NOTE — ED Triage Notes (Addendum)
Pt states her boyfriend tested positive for chlamydia. Denies any sx's. Pt requesting pregnancy test d/t late menstrual cycle.  Pt c/o headache, body aches, and chills since being in waiting room.

## 2023-11-20 NOTE — ED Notes (Signed)
Pt left prior to d/c. States will see results on mychart.

## 2023-11-21 ENCOUNTER — Telehealth (HOSPITAL_COMMUNITY): Payer: Self-pay

## 2023-11-21 LAB — CERVICOVAGINAL ANCILLARY ONLY
Bacterial Vaginitis (gardnerella): POSITIVE — AB
Candida Glabrata: NEGATIVE
Candida Vaginitis: NEGATIVE
Chlamydia: NEGATIVE
Comment: NEGATIVE
Comment: NEGATIVE
Comment: NEGATIVE
Comment: NEGATIVE
Comment: NEGATIVE
Comment: NORMAL
Neisseria Gonorrhea: NEGATIVE
Trichomonas: POSITIVE — AB

## 2023-11-21 MED ORDER — METRONIDAZOLE 500 MG PO TABS: 500.0000 mg | ORAL_TABLET | Freq: Two times a day (BID) | ORAL | 0 refills | Status: AC

## 2023-11-21 NOTE — Telephone Encounter (Signed)
 Per protocol, pt requires tx with metronidazole. Reviewed with patient, verified pharmacy, prescription sent.

## 2024-05-02 ENCOUNTER — Encounter: Payer: Self-pay | Admitting: Advanced Practice Midwife
